# Patient Record
Sex: Female | Born: 1945
Health system: Southern US, Community
[De-identification: ages and names within clinical notes are randomized; demographics above are authoritative.]

## PROBLEM LIST (undated history)

## (undated) DIAGNOSIS — I671 Cerebral aneurysm, nonruptured: Secondary | ICD-10-CM

## (undated) DIAGNOSIS — E049 Nontoxic goiter, unspecified: Secondary | ICD-10-CM

## (undated) DIAGNOSIS — Z923 Personal history of irradiation: Secondary | ICD-10-CM

## (undated) DIAGNOSIS — I10 Essential (primary) hypertension: Secondary | ICD-10-CM

## (undated) DIAGNOSIS — E785 Hyperlipidemia, unspecified: Secondary | ICD-10-CM

## (undated) DIAGNOSIS — K5730 Diverticulosis of large intestine without perforation or abscess without bleeding: Secondary | ICD-10-CM

## (undated) DIAGNOSIS — M8580 Other specified disorders of bone density and structure, unspecified site: Secondary | ICD-10-CM

## (undated) DIAGNOSIS — K219 Gastro-esophageal reflux disease without esophagitis: Secondary | ICD-10-CM

## (undated) DIAGNOSIS — E559 Vitamin D deficiency, unspecified: Secondary | ICD-10-CM

## (undated) DIAGNOSIS — C801 Malignant (primary) neoplasm, unspecified: Secondary | ICD-10-CM

## (undated) HISTORY — DX: Essential (primary) hypertension: I10

## (undated) HISTORY — PX: OTHER SURGICAL HISTORY: SHX169

## (undated) HISTORY — DX: Other specified disorders of bone density and structure, unspecified site: M85.80

## (undated) HISTORY — DX: Diverticulosis of large intestine without perforation or abscess without bleeding: K57.30

## (undated) HISTORY — DX: Hyperlipidemia, unspecified: E78.5

## (undated) HISTORY — PX: OOPHORECTOMY: SHX86

## (undated) HISTORY — PX: BREAST LUMPECTOMY: SHX2

## (undated) HISTORY — DX: Gastro-esophageal reflux disease without esophagitis: K21.9

## (undated) HISTORY — DX: Malignant (primary) neoplasm, unspecified: C80.1

## (undated) HISTORY — PX: NOSE SURGERY: SHX723

## (undated) HISTORY — DX: Nontoxic goiter, unspecified: E04.9

## (undated) HISTORY — DX: Vitamin D deficiency, unspecified: E55.9

---

## 1974-02-27 HISTORY — PX: VAGINAL HYSTERECTOMY: SUR661

## 1999-04-18 ENCOUNTER — Other Ambulatory Visit: Admission: RE | Admit: 1999-04-18 | Discharge: 1999-04-18 | Payer: Self-pay | Admitting: Obstetrics and Gynecology

## 2000-06-07 ENCOUNTER — Other Ambulatory Visit: Admission: RE | Admit: 2000-06-07 | Discharge: 2000-06-07 | Payer: Self-pay | Admitting: Obstetrics and Gynecology

## 2001-08-06 ENCOUNTER — Other Ambulatory Visit: Admission: RE | Admit: 2001-08-06 | Discharge: 2001-08-06 | Payer: Self-pay | Admitting: Obstetrics and Gynecology

## 2002-10-22 ENCOUNTER — Other Ambulatory Visit: Admission: RE | Admit: 2002-10-22 | Discharge: 2002-10-22 | Payer: Self-pay | Admitting: Obstetrics and Gynecology

## 2004-02-15 ENCOUNTER — Other Ambulatory Visit: Admission: RE | Admit: 2004-02-15 | Discharge: 2004-02-15 | Payer: Self-pay | Admitting: Obstetrics and Gynecology

## 2005-03-01 ENCOUNTER — Ambulatory Visit: Payer: Self-pay | Admitting: Internal Medicine

## 2005-03-07 ENCOUNTER — Ambulatory Visit: Payer: Self-pay | Admitting: Internal Medicine

## 2005-04-11 ENCOUNTER — Ambulatory Visit: Payer: Self-pay | Admitting: Internal Medicine

## 2006-04-25 ENCOUNTER — Other Ambulatory Visit: Admission: RE | Admit: 2006-04-25 | Discharge: 2006-04-25 | Payer: Self-pay | Admitting: Obstetrics and Gynecology

## 2007-06-05 ENCOUNTER — Telehealth (INDEPENDENT_AMBULATORY_CARE_PROVIDER_SITE_OTHER): Payer: Self-pay | Admitting: *Deleted

## 2007-06-05 ENCOUNTER — Ambulatory Visit: Payer: Self-pay | Admitting: Internal Medicine

## 2007-06-05 DIAGNOSIS — K5730 Diverticulosis of large intestine without perforation or abscess without bleeding: Secondary | ICD-10-CM | POA: Insufficient documentation

## 2007-06-05 DIAGNOSIS — K219 Gastro-esophageal reflux disease without esophagitis: Secondary | ICD-10-CM

## 2007-06-05 DIAGNOSIS — E785 Hyperlipidemia, unspecified: Secondary | ICD-10-CM

## 2007-06-05 HISTORY — DX: Gastro-esophageal reflux disease without esophagitis: K21.9

## 2007-06-05 HISTORY — DX: Diverticulosis of large intestine without perforation or abscess without bleeding: K57.30

## 2007-06-05 HISTORY — DX: Hyperlipidemia, unspecified: E78.5

## 2007-06-19 ENCOUNTER — Encounter: Payer: Self-pay | Admitting: Internal Medicine

## 2007-06-20 ENCOUNTER — Ambulatory Visit: Payer: Self-pay | Admitting: Internal Medicine

## 2007-06-20 DIAGNOSIS — R221 Localized swelling, mass and lump, neck: Secondary | ICD-10-CM

## 2007-06-20 DIAGNOSIS — R220 Localized swelling, mass and lump, head: Secondary | ICD-10-CM | POA: Insufficient documentation

## 2007-06-27 ENCOUNTER — Encounter: Payer: Self-pay | Admitting: Internal Medicine

## 2008-02-11 ENCOUNTER — Ambulatory Visit: Payer: Self-pay | Admitting: Internal Medicine

## 2008-02-11 LAB — CONVERTED CEMR LAB
ALT: 18 units/L (ref 0–35)
AST: 21 units/L (ref 0–37)
Alkaline Phosphatase: 72 units/L (ref 39–117)
Bilirubin Urine: NEGATIVE
Bilirubin, Direct: 0.1 mg/dL (ref 0.0–0.3)
CO2: 28 meq/L (ref 19–32)
Calcium: 9 mg/dL (ref 8.4–10.5)
Chloride: 104 meq/L (ref 96–112)
Glucose, Bld: 95 mg/dL (ref 70–99)
Hemoglobin: 13.8 g/dL (ref 12.0–15.0)
Leukocytes, UA: NEGATIVE
Lymphocytes Relative: 33.8 % (ref 12.0–46.0)
Monocytes Relative: 6.4 % (ref 3.0–12.0)
Mucus, UA: NEGATIVE
Neutro Abs: 4.7 10*3/uL (ref 1.4–7.7)
Neutrophils Relative %: 56 % (ref 43.0–77.0)
Nitrite: NEGATIVE
Potassium: 4 meq/L (ref 3.5–5.1)
RBC: 4.09 M/uL (ref 3.87–5.11)
RDW: 11.7 % (ref 11.5–14.6)
Sodium: 138 meq/L (ref 135–145)
Specific Gravity, Urine: <=1.005 (ref 1.000–1.03)
Total Bilirubin: 0.8 mg/dL (ref 0.3–1.2)
Total CHOL/HDL Ratio: 3.5
Total Protein: 6.5 g/dL (ref 6.0–8.3)
Urobilinogen, UA: 0.2 (ref 0.0–1.0)
WBC, UA: NONE SEEN cells/hpf
pH: 6 (ref 5.0–8.0)

## 2008-02-13 ENCOUNTER — Ambulatory Visit: Payer: Self-pay | Admitting: Internal Medicine

## 2008-02-13 DIAGNOSIS — R42 Dizziness and giddiness: Secondary | ICD-10-CM | POA: Insufficient documentation

## 2008-03-03 ENCOUNTER — Encounter: Payer: Self-pay | Admitting: Internal Medicine

## 2008-03-03 ENCOUNTER — Encounter: Admission: RE | Admit: 2008-03-03 | Discharge: 2008-03-03 | Payer: Self-pay | Admitting: Internal Medicine

## 2008-03-03 DIAGNOSIS — E049 Nontoxic goiter, unspecified: Secondary | ICD-10-CM

## 2008-03-03 HISTORY — DX: Nontoxic goiter, unspecified: E04.9

## 2008-03-09 ENCOUNTER — Encounter: Admission: RE | Admit: 2008-03-09 | Discharge: 2008-03-09 | Payer: Self-pay | Admitting: Internal Medicine

## 2008-06-25 ENCOUNTER — Other Ambulatory Visit: Admission: RE | Admit: 2008-06-25 | Discharge: 2008-06-25 | Payer: Self-pay | Admitting: Obstetrics and Gynecology

## 2008-06-25 ENCOUNTER — Ambulatory Visit: Payer: Self-pay | Admitting: Obstetrics and Gynecology

## 2008-06-25 ENCOUNTER — Encounter: Payer: Self-pay | Admitting: Obstetrics and Gynecology

## 2008-11-17 ENCOUNTER — Ambulatory Visit: Payer: Self-pay | Admitting: Internal Medicine

## 2008-11-17 DIAGNOSIS — J029 Acute pharyngitis, unspecified: Secondary | ICD-10-CM | POA: Insufficient documentation

## 2008-11-17 DIAGNOSIS — M549 Dorsalgia, unspecified: Secondary | ICD-10-CM | POA: Insufficient documentation

## 2009-02-27 HISTORY — PX: COLONOSCOPY: SHX174

## 2009-05-18 ENCOUNTER — Encounter (INDEPENDENT_AMBULATORY_CARE_PROVIDER_SITE_OTHER): Payer: Self-pay | Admitting: *Deleted

## 2009-06-10 ENCOUNTER — Ambulatory Visit: Payer: Self-pay | Admitting: Gastroenterology

## 2009-06-10 ENCOUNTER — Encounter (INDEPENDENT_AMBULATORY_CARE_PROVIDER_SITE_OTHER): Payer: Self-pay | Admitting: *Deleted

## 2009-06-24 ENCOUNTER — Ambulatory Visit: Payer: Self-pay | Admitting: Gastroenterology

## 2009-06-28 ENCOUNTER — Encounter: Payer: Self-pay | Admitting: Gastroenterology

## 2009-07-06 ENCOUNTER — Other Ambulatory Visit: Admission: RE | Admit: 2009-07-06 | Discharge: 2009-07-06 | Payer: Self-pay | Admitting: Obstetrics and Gynecology

## 2009-07-06 ENCOUNTER — Ambulatory Visit: Payer: Self-pay | Admitting: Obstetrics and Gynecology

## 2010-01-11 ENCOUNTER — Ambulatory Visit: Payer: Self-pay | Admitting: Internal Medicine

## 2010-01-11 LAB — CONVERTED CEMR LAB
ALT: 13 units/L (ref 0–35)
BUN: 9 mg/dL (ref 6–23)
Basophils Absolute: 0 10*3/uL (ref 0.0–0.1)
Basophils Relative: 0.4 % (ref 0.0–3.0)
Bilirubin Urine: NEGATIVE
Bilirubin, Direct: 0 mg/dL (ref 0.0–0.3)
CO2: 26 meq/L (ref 19–32)
Calcium: 8.8 mg/dL (ref 8.4–10.5)
Chloride: 100 meq/L (ref 96–112)
Cholesterol: 267 mg/dL — ABNORMAL HIGH (ref 0–200)
Creatinine, Ser: 0.6 mg/dL (ref 0.4–1.2)
Direct LDL: 148.8 mg/dL
Eosinophils Absolute: 0.3 10*3/uL (ref 0.0–0.7)
HCT: 39.5 % (ref 36.0–46.0)
Hemoglobin: 13.7 g/dL (ref 12.0–15.0)
Ketones, ur: NEGATIVE mg/dL
Lymphocytes Relative: 33.5 % (ref 12.0–46.0)
Lymphs Abs: 2.8 10*3/uL (ref 0.7–4.0)
MCHC: 34.6 g/dL (ref 30.0–36.0)
MCV: 96.1 fL (ref 78.0–100.0)
Neutro Abs: 4.7 10*3/uL (ref 1.4–7.7)
RBC: 4.11 M/uL (ref 3.87–5.11)
RDW: 13 % (ref 11.5–14.6)
Specific Gravity, Urine: <=1.005 (ref 1.000–1.030)
TSH: 3.48 microintl units/mL (ref 0.35–5.50)
Total Bilirubin: 0.5 mg/dL (ref 0.3–1.2)
Total Protein: 6.4 g/dL (ref 6.0–8.3)
Triglycerides: 170 mg/dL — ABNORMAL HIGH (ref 0.0–149.0)
Urine Glucose: NEGATIVE mg/dL
pH: 6.5 (ref 5.0–8.0)

## 2010-01-26 ENCOUNTER — Ambulatory Visit: Payer: Self-pay | Admitting: Internal Medicine

## 2010-01-26 ENCOUNTER — Encounter: Payer: Self-pay | Admitting: Internal Medicine

## 2010-01-26 DIAGNOSIS — K111 Hypertrophy of salivary gland: Secondary | ICD-10-CM | POA: Insufficient documentation

## 2010-03-31 NOTE — Procedures (Signed)
Summary: Colonoscopy  Patient: Isabel Christensen Note: All result statuses are Final unless otherwise noted.  Tests: (1) Colonoscopy (COL)   COL Colonoscopy           DONE     [facility]     [address]     [place], [place]  [address]           COLONOSCOPY PROCEDURE REPORT           PATIENT:  Isabel Christensen, Isabel Christensen  MR#:  [id number]     BIRTHDATE:  February 12, 1946, 64 yrs. old  GENDER:  female           ENDOSCOPIST:  Barbette Hair. Arlyce Dice, MD     Referred by:           PROCEDURE DATE:  06/24/2009     PROCEDURE:  Colonoscopy with snare polypectomy     ASA CLASS:  Class II     INDICATIONS:  1) Routine Risk Screening           MEDICATIONS:   Fentanyl 50 mcg IV, Versed 7 mg IV           DESCRIPTION OF PROCEDURE:   After the risks benefits and     alternatives of the procedure were thoroughly explained, informed     consent was obtained.  Digital rectal exam was performed and     revealed no abnormalities.   The Pentax Colonoscope [id number]     endoscope was introduced through the anus and advanced to the     cecum, which was identified by both the appendix and ileocecal     valve, without limitations.  The quality of the prep was     excellent, using MoviPrep.  The instrument was then slowly     withdrawn as the colon was fully examined.     <<PROCEDUREIMAGES>>           FINDINGS:  A sessile polyp was found in the descending colon. It     was 4 mm in size. Polyp was snared without cautery. Retrieval was     successful (see image6). snare polyp  Scattered diverticula were     found in the sigmoid to descending colon segments (see image8 and     image9).  This was otherwise a normal examination of the colon     (see image1, image2, image3, image4, image5, and image10).     Retroflexed views in the rectum revealed no abnormalities.    The     time to cecum =  3.5  minutes. The scope was then withdrawn (time     =  8.0  min) from the patient and the procedure completed.        COMPLICATIONS:  None           ENDOSCOPIC IMPRESSION:     1) 4 mm sessile polyp in the descending colon     2) Diverticula, scattered in the sigmoid to descending colon     segments     3) Otherwise normal examination     RECOMMENDATIONS:     1) If the polyp(s) removed today are proven to be adenomatous     (pre-cancerous) polyps, you will need a repeat colonoscopy in 5     years. Otherwise you should continue to follow colorectal cancer     screening guidelines for "routine risk" patients with colonoscopy     in 10 years.           REPEAT  EXAM:  You will receive a letter from Dr. Arlyce Dice in 1-2     weeks, after reviewing the final pathology, with followup     recommendations.           ______________________________     Barbette Hair Arlyce Dice, MD           CC: Corwin Levins, MD           n.     Rosalie Doctor:   Barbette Hair. Kiam Bransfield at 06/24/2009 11:04 AM           Elby Beck, [id number]  Note: An exclamation mark (!) indicates a result that was not dispersed into the flowsheet. Document Creation Date: 06/24/2009 11:04 AM _______________________________________________________________________  (1) Order result status: Final Collection or observation date-time: 06/24/2009 10:59 Requested date-time:  Receipt date-time:  Reported date-time:  Referring Physician:   Ordering Physician: Melvia Heaps [phone]) Specimen Source:  Source: Launa Grill Order Number: [id number] Lab site:   Appended Document: Colonoscopy     Procedures Next Due Date:    Colonoscopy: 05/2019

## 2010-03-31 NOTE — Letter (Signed)
Summary: Colonoscopy Letter  [facility]  [address] [place], [place] [address]   Phone: [phone]  Fax: [phone]      May 18, 2009 MRN: [id number]   [facility] [address] [place], [place]  [address]   Dear Isabel Christensen,   According to your medical record, it is time for you to schedule a Colonoscopy. The American Cancer Society recommends this procedure as a method to detect early colon cancer. Patients with a family history of colon cancer, or a personal history of colon polyps or inflammatory bowel disease are at increased risk.  This letter has been generated based on the recommendations made at the time of your procedure. If you feel that in your particular situation this may no longer apply, please contact our office.  Please call our office at [phone] to schedule this appointment or to update your records at your earliest convenience.  Thank you for cooperating with [place] to provide you with the very best care possible.   Sincerely,  Barbette Hair. Arlyce Dice, M.D.  [facility] [phone]

## 2010-03-31 NOTE — Letter (Signed)
Summary: Patient Notice-Hyperplastic Polyps  [facility]  [address] [place], [place] [address]   Phone: [phone]  Fax: [phone]        Jun 28, 2009 MRN: [id number]    [facility] [address] [place], [place]  [address]    Dear Ms. Eriksson,  I am pleased to inform you that the colon polyp(s) removed during your recent colonoscopy was (were) found to be hyperplastic.  These types of polyps are NOT pre-cancerous.  It is therefore my recommendation that you have a repeat colonoscopy examination in 10_ years for routine colorectal cancer screening.  Should you develop new or worsening symptoms of abdominal pain, bowel habit changes or bleeding from the rectum or bowels, please schedule an evaluation with either your primary care physician or with me.  Additional information/recommendations:  __No further action with gastroenterology is needed at this time.      Please follow-up with your primary care physician for your other      healthcare needs. __Please call [phone] to schedule a return visit to review      your situation.  __Please keep your follow-up visit as already scheduled.  _x_Continue treatment plan as outlined the day of your exam.  Please call us if you are having persistent problems or have questions about your condition that have not been fully answered at this time.  Sincerely,  Louis Meckel MD This letter has been electronically signed by your physician.  Appended Document: Patient Notice-Hyperplastic Polyps letter mailed 5.3.11

## 2010-03-31 NOTE — Miscellaneous (Signed)
Summary: [facility] Previsit/prep  Clinical Lists Changes  Medications: Added new medication of MOVIPREP 100 GM  SOLR (PEG-KCL-NACL-NASULF-NA ASC-C) As per prep instructions. - Signed Rx of MOVIPREP 100 GM  SOLR (PEG-KCL-NACL-NASULF-NA ASC-C) As per prep instructions.;  #1 x 0;  Signed;  Entered by: Wyona Almas RN;  Authorized by: Louis Meckel MD;  Method used: [facility] [phone]*, [address], [place], [place], [place]  [address], Ph: [phone] or [phone], Fax: [phone] Observations: Added new observation of ALLERGY REV: Done (06/10/2009 10:26)    Prescriptions: MOVIPREP 100 GM  SOLR (PEG-KCL-NACL-NASULF-NA ASC-C) As per prep instructions.  #1 x 0   Entered by:   Wyona Almas RN   Authorized by:   Louis Meckel MD   Signed by:   Wyona Almas RN on 06/10/2009   Method used:   [facility]ronically to        [facility] [phone]* (retail)       [address]       [place], [place]  [address]       Ph: [phone] or [phone]       Fax: [phone]   RxID:   [id number]

## 2010-03-31 NOTE — Letter (Signed)
Summary: [facility] Instructions  [facility]  [address] [place], [place] [address]   Phone: [phone]  Fax: [phone]       Isabel Christensen    1945-03-26    MRN: [id number]        Procedure Day Dorna Bloom:  Lenor Coffin  06/24/09     Arrival Time:  9:30AM     Procedure Time:  10:30AM     Location of Procedure:                    _X _  [facility] (4th Floor)                        PREPARATION FOR COLONOSCOPY WITH MOVIPREP   Starting 5 days prior to your procedure 06/19/09 do not eat nuts, seeds, popcorn, corn, beans, peas,  salads, or any raw vegetables.  Do not take any fiber supplements (e.g. Metamucil, Citrucel, and Benefiber).  THE DAY BEFORE YOUR PROCEDURE         DATE: 06/23/09  DAY: WEDNESDAY  1.  Drink clear liquids the entire day-NO SOLID FOOD  2.  Do not drink anything colored red or purple.  Avoid juices with pulp.  No orange juice.  3.  Drink at least 64 oz. (8 glasses) of fluid/clear liquids during the day to prevent dehydration and help the prep work efficiently.  CLEAR LIQUIDS INCLUDE: Water Jello Ice Popsicles Tea (sugar ok, no milk/cream) Powdered fruit flavored drinks Coffee (sugar ok, no milk/cream) Gatorade Juice: apple, white grape, white cranberry  Lemonade Clear bullion, consomm, broth Carbonated beverages (any kind) Strained chicken noodle soup Hard Candy                             4.  In the morning, mix first dose of MoviPrep solution:    Empty 1 Pouch A and 1 Pouch B into the disposable container    Add lukewarm drinking water to the top line of the container. Mix to dissolve    Refrigerate (mixed solution should be used within 24 hrs)  5.  Begin drinking the prep at 5:00 p.m. The MoviPrep container is divided by 4 marks.   Every 15 minutes drink the solution down to the next mark (approximately 8 oz) until the full liter is complete.   6.  Follow completed prep with 16 oz of clear liquid of your choice  (Nothing red or purple).  Continue to drink clear liquids until bedtime.  7.  Before going to bed, mix second dose of MoviPrep solution:    Empty 1 Pouch A and 1 Pouch B into the disposable container    Add lukewarm drinking water to the top line of the container. Mix to dissolve    Refrigerate  THE DAY OF YOUR PROCEDURE      DATE: 06/24/09  DAY: THURSDAY  Beginning at 5:30AM (5 hours before procedure):         1. Every 15 minutes, drink the solution down to the next mark (approx 8 oz) until the full liter is complete.  2. Follow completed prep with 16 oz. of clear liquid of your choice.    3. You may drink clear liquids until 8:30AM (2 HOURS BEFORE PROCEDURE).   MEDICATION INSTRUCTIONS  Unless otherwise instructed, you should take regular prescription medications with a small sip of water   as early as possible the morning  of your procedure.          OTHER INSTRUCTIONS  You will need a responsible adult at least 65 years of age to accompany you and drive you home.   This person must remain in the waiting room during your procedure.  Wear loose fitting clothing that is easily removed.  Leave jewelry and other valuables at home.  However, you may wish to bring a book to read or  an iPod/MP3 player to listen to music as you wait for your procedure to start.  Remove all body piercing jewelry and leave at home.  Total time from sign-in until discharge is approximately 2-3 hours.  You should go home directly after your procedure and rest.  You can resume normal activities the  day after your procedure.  The day of your procedure you should not:   Drive   Make legal decisions   Operate machinery   Drink alcohol   Return to work  You will receive specific instructions about eating, activities and medications before you leave.    The above instructions have been reviewed and explained to me by   Wyona Almas RN  June 10, 2009 10:59 AM     I fully  understand and can verbalize these instructions _____________________________ Date _________

## 2010-03-31 NOTE — Assessment & Plan Note (Signed)
Summary: CPX /NWS  #   Vital Signs:  Patient profile:   65 year old female Height:      63.5 inches Weight:      124.25 pounds BMI:     21.74 O2 Sat:      96 % on Room air Temp:     98 degrees F oral Pulse rate:   67 / minute BP sitting:   112 / 70  (left arm) Cuff size:   regular  Vitals Entered By: Zella Ball Ewing CMA Duncan Dull) (January 26, 2010 10:38 AM)  O2 Flow:  Room air  Preventive Care Screening     pap smear, mamogram and bone density per GYN  CC: ADult Physical/RE   CC:  ADult Physical/RE.  History of Present Illness: here for wellness, overall doing well and regards herself as good to excellent health;  Pt denies CP, worsening sob, doe, wheezing, orthopnea, pnd, worsening LE edema, palps, dizziness or syncope  Pt denies new neuro symptoms such as headache, facial or extremity weakness  Pt denies polydipsia, polyuria.  Overall good compliance with meds, trying to follow low chol diet, wt stable, little excercise however .  Denies worsening depressive symptoms, suicidal ideation, or panic.   No fever, wt loss, night sweats, loss of appetite or other constitutional symptoms  Overall good compliance with meds, and good tolerability.  Pt states good ability with ADL's, low fall risk, home safety reviewed and adequate, no significant change in hearing or vision, trying to follow lower chol diet, and remaiins very  active  with regular excercise.   Preventive Screening-Counseling & Management      Drug Use:  no.    Problems Prior to Update: 1)  Hypertrophy of Salivary Gland  (ICD-527.1) 2)  Back Pain  (ICD-724.5) 3)  Acute Pharyngitis  (ICD-462) 4)  Goiter  (ICD-240.9) 5)  Dizziness  (ICD-780.4) 6)  Preventive Health Care  (ICD-V70.0) 7)  Neck Mass  (ICD-784.2) 8)  Diverticulosis, Colon  (ICD-562.10) 9)  Hyperlipidemia  (ICD-272.4) 10)  Gerd  (ICD-530.81)  Medications Prior to Update: 1)  Premarin 0.3 Mg  Tabs (Estrogens Conjugated) .... 1/2 Tab Qd 2)  Epipen Jr 2-Pak  0.15 Mg/0.59ml (1:2000) Devi (Epinephrine Hcl (Anaphylaxis)) .... Use Asd 3)  Azithromycin 250 Mg Tabs (Azithromycin) .... 2po Qd For 1 Day, Then 1po Qd For 4days, Then Stop  Current Medications (verified): 1)  Premarin 0.3 Mg  Tabs (Estrogens Conjugated) .... 1/2 Tab Qd 2)  Epipen Jr 2-Pak 0.15 Mg/0.26ml (1:2000) Devi (Epinephrine Hcl (Anaphylaxis)) .... Use Asd 3)  Lansoprazole 15 Mg Cpdr (Lansoprazole) .Marland Kitchen.. 1 By Mouth Once Daily  Allergies (verified): 1)  ! Phenergan 2)  ! 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         all otherwise negative per pt -  except for occasional GERD symtpoms, and a knwon right lower lip subq "mass" the she manually "popped" yesterday and now smaller but still sore  Physical Exam  General:  alert and well-developed.   Head:  normocephalic and atraumatic.   Eyes:  vision grossly intact, pupils  equal, and pupils round.   Ears:  R ear normal and L ear normal.   Nose:  no external deformity and no nasal discharge.   Mouth:  no gingival abnormalities and pharynx pink and moist.   Neck:  supple and no masses.   Lungs:  normal respiratory effort and normal breath sounds.   Heart:  normal rate and regular rhythm.   Abdomen:  soft, non-tender, and normal bowel sounds.   Msk:  no joint tenderness and no joint swelling., no lumbar tenderness  or paravertebral tedner Extremities:  no edema, no erythema  Neurologic:  strength normal in all extremities and gait normal.   Skin:  color normal, no rashes, and no suspicious lesions.   Psych:  not anxious appearing and not depressed appearing.     Impression & Recommendations:  Problem # 1:  Preventive Health Care (ICD-V70.0) Overall doing well, age appropriate education and counseling updated, referral for preventive services and immunizations addressed, dietary counseling and smoking status adressed , most recent labs reviewed, ecg reviewed I have personally reviewed and have noted 1.The patient's medical and social history 2.Their use of alcohol, tobacco or illicit drugs 3.Their current medications and supplements 4. Functional ability including ADL's, fall risk, home safety risk, hearing & visual impairment  5.Diet and physical activities 6.Evidence for depression or mood disorders The patients weight, height, BMI  have been recorded in the chart I have made referrals, counseling and provided education to the patient based review of the above  Orders: EKG w/ Interpretation ([address])  Problem # 2:  HYPERTROPHY OF SALIVARY GLAND (ICD-527.1) irght lower lip, improved with manipulation as above, no drainage, redness or swelling around the subq lesion, hold on antibx for now, consider oral surgury  Problem # 3:  HYPERLIPIDEMIA (ICD-272.4)  to cont diet - declines statin fo now  Labs Reviewed: SGOT: 21 (01/11/2010)   SGPT: 13  (01/11/2010)   HDL:89.00 (01/11/2010), 78.8 (02/11/2008)  LDL:DEL (02/11/2008)  Chol:267 (01/11/2010), 276 (02/11/2008)  Trig:170.0 (01/11/2010), 100 (02/11/2008)  Problem # 4:  GERD (ICD-530.81)  Her updated medication list for this problem includes:    Lansoprazole 15 Mg Cpdr (Lansoprazole) .Marland Kitchen... 1 by mouth once daily treat as above, f/u any worsening signs or symptoms , no dysphgia   Complete Medication List: 1)  Premarin 0.3 Mg Tabs (Estrogens conjugated) .... 1/2 tab qd 2)  Epipen Jr 2-pak 0.15 Mg/0.86ml (1:2000) Devi (Epinephrine hcl (anaphylaxis)) .... Use asd 3)  Lansoprazole 15 Mg Cpdr (Lansoprazole) .Marland Kitchen.. 1 by mouth once daily  Patient Instructions: 1)  please follow lower cholesterol diet 2)  Please take all new medications as prescribed  - the generic prevacid 3)  Continue all previous medications as before this visit  4)  call if you feel the need for referral to oral surgury 5)  Please schedule a follow-up appointment in 1 year, or sooner if needed Prescriptions: LANSOPRAZOLE 15 MG CPDR (LANSOPRAZOLE) 1 by mouth once daily  #90 x 3   Entered and Authorized by:   Corwin Levins MD   Signed by:   Corwin Levins MD  on 01/26/2010   Method used:   Electronically to        [facility] (retail)       [address]       [place], [place]  [address]       Ph: [phone]       Fax: [phone]   RxID:   [id number]    Orders Added: 1)  EKG w/ Interpretation [93000] 2)  Est. Patient 40-64 years [phone]

## 2010-07-06 ENCOUNTER — Telehealth: Payer: Self-pay | Admitting: *Deleted

## 2010-07-06 MED ORDER — LANSOPRAZOLE 15 MG PO CPDR: 15.0000 mg | DELAYED_RELEASE_CAPSULE | Freq: Every day | ORAL | Status: DC

## 2010-07-06 NOTE — Telephone Encounter (Signed)
Patient requesting gen prevacid 90 days supply to go to [facility].

## 2010-10-20 ENCOUNTER — Other Ambulatory Visit (HOSPITAL_COMMUNITY)
Admission: RE | Admit: 2010-10-20 | Discharge: 2010-10-20 | Disposition: A | Discharge status: Home / Self Care | Payer: Medicare Other | Source: Ambulatory Visit | Attending: Obstetrics and Gynecology | Admitting: Obstetrics and Gynecology

## 2010-10-20 ENCOUNTER — Ambulatory Visit (INDEPENDENT_AMBULATORY_CARE_PROVIDER_SITE_OTHER): Payer: Medicare Other | Admitting: Obstetrics and Gynecology

## 2010-10-20 ENCOUNTER — Encounter: Payer: Self-pay | Admitting: Obstetrics and Gynecology

## 2010-10-20 VITALS — BP 118/76 | Ht 63.0 in | Wt 122.0 lb

## 2010-10-20 DIAGNOSIS — R32 Unspecified urinary incontinence: Secondary | ICD-10-CM

## 2010-10-20 DIAGNOSIS — N952 Postmenopausal atrophic vaginitis: Secondary | ICD-10-CM

## 2010-10-20 DIAGNOSIS — Z780 Asymptomatic menopausal state: Secondary | ICD-10-CM

## 2010-10-20 DIAGNOSIS — N951 Menopausal and female climacteric states: Secondary | ICD-10-CM

## 2010-10-20 DIAGNOSIS — Z124 Encounter for screening for malignant neoplasm of cervix: Secondary | ICD-10-CM

## 2010-10-20 DIAGNOSIS — Z01419 Encounter for gynecological examination (general) (routine) without abnormal findings: Secondary | ICD-10-CM | POA: Insufficient documentation

## 2010-10-20 MED ORDER — ESTRADIOL 0.05 MG/24HR TD PTTW: 1.0000 | MEDICATED_PATCH | TRANSDERMAL | Status: DC

## 2010-10-20 NOTE — Progress Notes (Signed)
The patient came to see me today for further followup. Measure we increased her HRT from Premarin 0.3 mg to estradiol half a milligram. She is doing better although she still gets hot flashes. She is also having vaginal dryness. She is having discomfort with intercourse. She does use asroglyde. She feels that she doesn't empty her bladder well. She is urinary stress incontinence. She does get up at night to void.  She does not have urgency. She does not have dysuria. She is having no pelvic pain. She is having no vaginal bleeding. She is due for her yearly mammogram which she does in [place]. Her last bone density was normal but was 8 years ago. She is a thin white female. She's had no fractures.  Past medical history, family history, social history, and review of systems all reviewed and in epic record. 9 system review of systems done with all positive and negative pertinent findings discussed above.  HEENT: Within normal limits. Neck: No masses. Supraclavicular lymph nodes: Not enlarged. Breasts: Examined in both sitting and lying position. Symmetrical without skin changes or masses. Abdomen: Soft no masses guarding or rebound. No hernias. Pelvic: External within normal limits. BUS within normal limits. Vaginal examination shows good estrogen effect, no cystocele enterocele or rectocele. Cervix and uterus absent. Adnexa within normal limits. Rectovaginal confirmatory. Extremities within normal limits.   Assessment: 1. Heart flashes 2. Atrophic vaginitis 3. Urinary stress incontinence  Plan: We are very long discussion of pros and cons of HRT at this point. We will switch her to a patch to try to reduce cardiovascular issues. She was started on Vivelle dot patch 0.05 mg twice a week. She will get a mammogram in bone density in [facility].

## 2011-01-13 ENCOUNTER — Other Ambulatory Visit: Payer: Self-pay | Admitting: Internal Medicine

## 2011-01-25 ENCOUNTER — Other Ambulatory Visit: Payer: Self-pay

## 2011-01-25 MED ORDER — LANSOPRAZOLE 15 MG PO CPDR: 15.0000 mg | DELAYED_RELEASE_CAPSULE | Freq: Every day | ORAL | Status: DC

## 2011-05-10 ENCOUNTER — Encounter: Payer: Self-pay | Admitting: Obstetrics and Gynecology

## 2011-09-22 ENCOUNTER — Other Ambulatory Visit: Payer: Self-pay | Admitting: Obstetrics and Gynecology

## 2011-11-14 ENCOUNTER — Encounter: Payer: BC Managed Care – PPO | Admitting: Obstetrics and Gynecology

## 2011-11-21 ENCOUNTER — Other Ambulatory Visit: Payer: Self-pay | Admitting: Obstetrics and Gynecology

## 2011-11-29 ENCOUNTER — Ambulatory Visit (INDEPENDENT_AMBULATORY_CARE_PROVIDER_SITE_OTHER): Payer: Medicare Other | Admitting: Obstetrics and Gynecology

## 2011-11-29 ENCOUNTER — Encounter: Payer: Self-pay | Admitting: Obstetrics and Gynecology

## 2011-11-29 VITALS — BP 120/78 | Ht 64.0 in | Wt 120.0 lb

## 2011-11-29 DIAGNOSIS — N952 Postmenopausal atrophic vaginitis: Secondary | ICD-10-CM

## 2011-11-29 DIAGNOSIS — Z803 Family history of malignant neoplasm of breast: Secondary | ICD-10-CM

## 2011-11-29 DIAGNOSIS — Z780 Asymptomatic menopausal state: Secondary | ICD-10-CM

## 2011-11-29 MED ORDER — ESTRADIOL 0.05 MG/24HR TD PTTW: 1.0000 | MEDICATED_PATCH | TRANSDERMAL | Status: DC

## 2011-11-29 NOTE — Progress Notes (Signed)
Patient came to see me today for her annual GYN exam. She continues on an estrogen patch due to severe vasomotor symptoms and vaginal dryness. She had a vaginal hysterectomy in 1976 for sterilization. She has always had normal Pap smears. Her last Pap smear was 2012. She had a mammogram in 2013. She had a normal bone density 16 years ago. She was concerned regarding the patch due to mother who developed breast cancer at age 67 has sister who just recently developed breast cancer at 40. She is having no vaginal bleeding. She is having no pelvic pain. She has no family history of ovarian cancer. She does her lab work from her PCP.  ROS: 12 system review done. Pertinent positives above. Other positives include thyroid goiter, hyperlipidemia, gerd, diverticulosis, dizziness.  Physical examination: HEENT within normal limits. Neck: Thyroid not large. No masses. Supraclavicular nodes: not enlarged. Breasts: Examined in both sitting and lying  position. No skin changes and no masses. Abdomen: Soft no guarding rebound or masses or hernia. Pelvic: External: Within normal limits. BUS: Within normal limits. Vaginal:within normal limits. Good estrogen effect. No evidence of cystocele rectocele or enterocele. Cervix and uterus surgically absent. Adnexa: No masses. Rectovaginal exam: Confirmatory and negative. Extremities: Within normal limits.  Assessment: #1. Menopausal symptoms #2. Atrophic vaginitis #3. Family history of late onset breast cancer  Plan: Discussed all the data on estrogen and breast cancer. Reassured her about unopposed estrogen and risk of breast cancer. Continue yearly mammograms. Suggest that she have 3-D mammograms. Scheduled bone density. Pap not done.The new Pap smear guidelines were discussed with the patient.

## 2011-11-29 NOTE — Patient Instructions (Addendum)
Schedule  bone density. Continue yearly mammograms. 

## 2011-12-14 ENCOUNTER — Ambulatory Visit (INDEPENDENT_AMBULATORY_CARE_PROVIDER_SITE_OTHER): Payer: Medicare Other

## 2011-12-14 DIAGNOSIS — M8580 Other specified disorders of bone density and structure, unspecified site: Secondary | ICD-10-CM

## 2011-12-14 DIAGNOSIS — Z780 Asymptomatic menopausal state: Secondary | ICD-10-CM

## 2011-12-14 DIAGNOSIS — M949 Disorder of cartilage, unspecified: Secondary | ICD-10-CM

## 2011-12-14 DIAGNOSIS — M899 Disorder of bone, unspecified: Secondary | ICD-10-CM

## 2011-12-20 ENCOUNTER — Encounter: Payer: Self-pay | Admitting: Obstetrics and Gynecology

## 2012-02-26 ENCOUNTER — Telehealth: Payer: Self-pay | Admitting: Internal Medicine

## 2012-02-26 NOTE — Telephone Encounter (Signed)
Pt was last seen in Nov. 2011.  She has medicare.  Is it ok to schedule her for a physical with you?

## 2012-02-28 NOTE — Telephone Encounter (Signed)
Ok to schedule one visit with me for f/u chronic problems, and also a separate appt with Rene Kocher to do her free yearly Medicare Wellness visit (can be done on the same day)

## 2012-03-01 NOTE — Telephone Encounter (Signed)
Pt scheduled her medicare wellness for Jan 10 with Nicki Reaper.

## 2012-03-03 ENCOUNTER — Emergency Department (HOSPITAL_COMMUNITY)
Admission: EM | Admit: 2012-03-03 | Discharge: 2012-03-03 | Disposition: A | Discharge status: Home / Self Care | Payer: Medicare Other | Attending: Emergency Medicine | Admitting: Emergency Medicine

## 2012-03-03 ENCOUNTER — Emergency Department (HOSPITAL_COMMUNITY): Payer: Medicare Other

## 2012-03-03 ENCOUNTER — Encounter (HOSPITAL_COMMUNITY): Payer: Self-pay | Admitting: *Deleted

## 2012-03-03 DIAGNOSIS — R0602 Shortness of breath: Secondary | ICD-10-CM | POA: Insufficient documentation

## 2012-03-03 DIAGNOSIS — R319 Hematuria, unspecified: Secondary | ICD-10-CM | POA: Insufficient documentation

## 2012-03-03 DIAGNOSIS — Z79899 Other long term (current) drug therapy: Secondary | ICD-10-CM | POA: Insufficient documentation

## 2012-03-03 DIAGNOSIS — R079 Chest pain, unspecified: Secondary | ICD-10-CM | POA: Insufficient documentation

## 2012-03-03 DIAGNOSIS — Z8679 Personal history of other diseases of the circulatory system: Secondary | ICD-10-CM | POA: Insufficient documentation

## 2012-03-03 DIAGNOSIS — F17200 Nicotine dependence, unspecified, uncomplicated: Secondary | ICD-10-CM | POA: Insufficient documentation

## 2012-03-03 HISTORY — DX: Cerebral aneurysm, nonruptured: I67.1

## 2012-03-03 LAB — BASIC METABOLIC PANEL
BUN: 11 mg/dL (ref 6–23)
CO2: 25 mEq/L (ref 19–32)
Chloride: 100 mEq/L (ref 96–112)
GFR calc non Af Amer: >90 mL/min (ref >90)
Glucose, Bld: 97 mg/dL (ref 70–99)
Potassium: 3.5 mEq/L (ref 3.5–5.1)
Sodium: 139 mEq/L (ref 135–145)

## 2012-03-03 LAB — CBC
HCT: 42.3 % (ref 36.0–46.0)
Hemoglobin: 14.9 g/dL (ref 12.0–15.0)
RBC: 4.48 MIL/uL (ref 3.87–5.11)
WBC: 9.5 10*3/uL (ref 4.0–10.5)

## 2012-03-03 NOTE — ED Notes (Signed)
Called lab to check on troponin 

## 2012-03-03 NOTE — ED Provider Notes (Signed)
History     CSN: [id number]  Arrival date & time 03/03/12  1325   None     Chief Complaint  Patient presents with  . Chest Pain    (Consider location/radiation/quality/duration/timing/severity/associated sxs/prior treatment) HPI Comments: Patient is a 67 year old woman who says she developed chest pain after Christmas. She feels it in her left anterior chest, and it doesn't radiate. She says a few years ago she had pneumonia in the pain was something like that. She's had some sinus problems recently. The chest pain would come and go, without any obvious precipitating cause. She took no medication for it. Today she felt a little more after church, and went to an urgent care Center, where her blood pressure was noted to be elevated at 188/100. Therefore she was sent to [facility] for evaluation. There is no prior history of any heart disease. She does smoke one pack of cigarettes per day.  Patient is a 67 y.o. female presenting with chest pain. The history is provided by the patient. No language interpreter was used.  Chest Pain The chest pain began 1 - 2 weeks ago. Episode Length: She has brief episodes of chest pain in the left precordial region. The pain is like a pressure feeling. Chest pain occurs intermittently. The chest pain is unchanged. Associated with: Nothing. The severity of the pain is moderate. The quality of the pain is described as pressure-like. The pain does not radiate. Exacerbated by: Nothing. Primary symptoms include shortness of breath. Pertinent negatives for primary symptoms include no fever and no cough. She tried nothing for the symptoms. Risk factors include smoking/tobacco exposure and being elderly.     Past Medical History  Diagnosis Date  . Hematuria     FOLLOWED BY UROLOGIST FOR THIS  . Brain aneurysm     Past Surgical History  Procedure Date  . Nose surgery   . Vaginal hysterectomy 1976    FOR STERILIZATION    Family History  Problem Relation  Age of Onset  . Diabetes Mother   . Hypertension Mother   . Heart disease Mother   . Breast cancer Mother     Age 63  . Cancer Sister     MELANOMA  . Breast cancer Sister     Age 9  . Cancer Sister     Melanoma    History  Substance Use Topics  . Smoking status: Current Every Day Smoker -- 1.0 packs/day    Types: Cigarettes  . Smokeless tobacco: Never Used  . Alcohol Use: 5.0 oz/week    10 drink(s) per week    OB History    Grav Para Term Preterm Abortions TAB SAB Ect Mult Living   1    1     0      Review of Systems  Constitutional: Negative.  Negative for fever and chills.  Respiratory: Positive for shortness of breath. Negative for cough.   Cardiovascular: Positive for chest pain.  Gastrointestinal: Negative.   Genitourinary: Negative.   Musculoskeletal: Negative.   Skin: Negative.   Neurological: Negative.  Negative for syncope.  Psychiatric/Behavioral: Negative.     Allergies  Aspirin; Black cohosh-soybean oil cap; and Promethazine hcl  Home Medications   Current Outpatient Rx  Name  Route  Sig  Dispense  Refill  . BIOTIN 1 MG PO CAPS   Oral   Take 1 capsule by mouth daily.         Marland Kitchen ESTRADIOL 0.05 MG/24HR TD PTTW  Transdermal   Place 1 patch (0.05 mg total) onto the skin 2 (two) times a week.   24 patch   4   . FLAX SEED OIL PO   Oral   Take 1 capsule by mouth daily.         Marland Kitchen LANSOPRAZOLE 15 MG PO CPDR   Oral   Take 1 capsule (15 mg total) by mouth daily.   90 capsule   0     Pt must make office visit for further refills   . ONE-DAILY MULTI VITAMINS PO TABS   Oral   Take 1 tablet by mouth daily.         . OCUVITE-LUTEIN PO CAPS   Oral   Take 1 capsule by mouth daily.           BP 172/80  Pulse 78  Temp 98.3 F (36.8 C)  Resp 18  SpO2 98%  Physical Exam  Nursing note and vitals reviewed. Constitutional: She is oriented to person, place, and time. She appears well-developed and well-nourished. No distress.  HENT:   Head: Normocephalic and atraumatic.  Right Ear: External ear normal.  Left Ear: External ear normal.  Mouth/Throat: Oropharynx is clear and moist.  Eyes: Conjunctivae normal and EOM are normal. Pupils are equal, round, and reactive to light. No scleral icterus.  Neck: Normal range of motion. Neck supple.  Cardiovascular: Normal rate, regular rhythm and normal heart sounds.   Pulmonary/Chest: Effort normal and breath sounds normal.  Abdominal: Soft. Bowel sounds are normal.  Musculoskeletal: Normal range of motion. She exhibits no edema and no tenderness.  Neurological: She is alert and oriented to person, place, and time.       No sensory or motor deficit.  Skin: Skin is warm and dry.  Psychiatric: She has a normal mood and affect. Her behavior is normal.    ED Course  Procedures (including critical care time)   Labs Reviewed  CBC  BASIC METABOLIC PANEL    Date: 03/03/2012  Rate: 79  Rhythm: normal sinus rhythm  QRS Axis: normal  Intervals: normal PQRS:  Left atrial abnormality  ST/T Wave abnormalities: normal  Conduction Disutrbances:none  Narrative Interpretation: Borderline EKG  Old EKG Reviewed: none available  Results for orders placed during the hospital encounter of 03/03/12  CBC      Component Value Range   WBC 9.5  4.0 - 10.5 K/uL   RBC 4.48  3.87 - 5.11 MIL/uL   Hemoglobin 14.9  12.0 - 15.0 g/dL   HCT 16.1  09.6 - 04.5 %   MCV 94.4  78.0 - 100.0 fL   MCH 33.3  26.0 - 34.0 pg   MCHC 35.2  30.0 - 36.0 g/dL   RDW 40.9  81.1 - 91.4 %   Platelets 276  150 - 400 K/uL  BASIC METABOLIC PANEL      Component Value Range   Sodium 139  135 - 145 mEq/L   Potassium 3.5  3.5 - 5.1 mEq/L   Chloride 100  96 - 112 mEq/L   CO2 25  19 - 32 mEq/L   Glucose, Bld 97  70 - 99 mg/dL   BUN 11  6 - 23 mg/dL   Creatinine, Ser 7.82  0.50 - 1.10 mg/dL   Calcium 9.6  8.4 - 95.6 mg/dL   GFR calc non Af Amer >90  >90 mL/min   GFR calc Af Amer >90  >90 mL/min   Dg Chest 2  View  03/03/2012  *RADIOLOGY REPORT*  Clinical Data: Chest pain.  Acute hypertension.  CHEST - 2 VIEW  Comparison: None.  Findings: Cardiac silhouette normal in size.  Thoracic aorta minimally tortuous.  Hilar and mediastinal contours otherwise unremarkable.  Lungs clear.  Bronchovascular markings normal. Pulmonary vascularity normal.  No pneumothorax.  No pleural effusions.  Visualized bony thorax intact.  IMPRESSION: No acute cardiopulmonary disease.   Original Report Authenticated By: Hulan Saas, M.D.    Troponin I was 0.0.  Lab workup was negative.  Pt released.  She was advised to followup with her internist, Oliver Barre, M.D., who can arrange a stress test for her.  She was also advised to try to stop smoking.     1. Nonspecific chest pain         Carleene Cooper III, MD 03/03/12 2119

## 2012-03-03 NOTE — ED Notes (Signed)
Pt is here with intermittent chest pain tightness for the last week.  No history of htn.  BP was in the 180s systolic at an urgent care.  Pt reports some sob

## 2012-03-05 ENCOUNTER — Encounter: Payer: Self-pay | Admitting: Internal Medicine

## 2012-03-05 ENCOUNTER — Ambulatory Visit (INDEPENDENT_AMBULATORY_CARE_PROVIDER_SITE_OTHER): Payer: Medicare Other | Admitting: Internal Medicine

## 2012-03-05 VITALS — BP 120/68 | HR 73 | Temp 98.0°F | Ht 64.0 in | Wt 120.0 lb

## 2012-03-05 DIAGNOSIS — Z0000 Encounter for general adult medical examination without abnormal findings: Secondary | ICD-10-CM

## 2012-03-05 NOTE — Patient Instructions (Addendum)

## 2012-03-05 NOTE — Progress Notes (Signed)
HPI:  Pt presents to the clinic today for her annual wellness visit. She has no concerns about her health, ADL's or home safety today.  Past Medical History  Diagnosis Date  . Hematuria     FOLLOWED BY UROLOGIST FOR THIS  . Brain aneurysm     Current Outpatient Prescriptions  Medication Sig Dispense Refill  . Biotin 1 MG CAPS Take 1 capsule by mouth daily.      Marland Kitchen estradiol (VIVELLE-DOT) 0.05 MG/24HR Place 1 patch (0.05 mg total) onto the skin 2 (two) times a week.  24 patch  4  . Flaxseed, Linseed, (FLAX SEED OIL PO) Take 1 capsule by mouth daily.      . lansoprazole (PREVACID) 15 MG capsule Take 1 capsule (15 mg total) by mouth daily.  90 capsule  0  . Multiple Vitamin (MULTIVITAMIN) tablet Take 1 tablet by mouth daily.      . psyllium (METAMUCIL) 58.6 % packet Take 1 packet by mouth daily.        Allergies  Allergen Reactions  . Aspirin     REACTION: brain aneurysm  . Black Cohosh-Soybean Oil Cap     REACTION: hives  . Promethazine Hcl     REACTION: rash    Family History  Problem Relation Age of Onset  . Diabetes Mother   . Hypertension Mother   . Heart disease Mother   . Breast cancer Mother     Age 64  . Cancer Sister     MELANOMA  . Breast cancer Sister     Age 50  . Cancer Sister     Melanoma    History   Social History  . Marital Status: Married    Spouse Name: N/A    Number of Children: N/A  . Years of Education: N/A   Occupational History  . Not on file.   Social History Main Topics  . Smoking status: Current Every Day Smoker -- 1.0 packs/day    Types: Cigarettes  . Smokeless tobacco: Never Used  . Alcohol Use: 5.0 oz/week    10 drink(s) per week  . Drug Use: No  . Sexually Active: Yes    Birth Control/ Protection: Surgical   Other Topics Concern  . Not on file   Social History Narrative  . No narrative on file    Hospitiliaztions: Er recently on 03/03/2011 for chest pains, ruled out for MI, will follow up with Dr. Jonny Ruiz and  cardiology  Health Maintenance:    Flu: never  Tetanus: 2010  Pneumovax: never  Zostavax: 2011  Bone Density: 2013  Colonoscopy: 2010  Eye Doctor: 2013  Dental Exam: 2013   Past Medical History  Diagnosis Date  . Hematuria     FOLLOWED BY UROLOGIST FOR THIS  . Brain aneurysm     Current Outpatient Prescriptions  Medication Sig Dispense Refill  . Biotin 1 MG CAPS Take 1 capsule by mouth daily.      Marland Kitchen estradiol (VIVELLE-DOT) 0.05 MG/24HR Place 1 patch (0.05 mg total) onto the skin 2 (two) times a week.  24 patch  4  . Flaxseed, Linseed, (FLAX SEED OIL PO) Take 1 capsule by mouth daily.      . lansoprazole (PREVACID) 15 MG capsule Take 1 capsule (15 mg total) by mouth daily.  90 capsule  0  . Multiple Vitamin (MULTIVITAMIN) tablet Take 1 tablet by mouth daily.      . psyllium (METAMUCIL) 58.6 % packet Take 1 packet by mouth daily.  Allergies  Allergen Reactions  . Aspirin     REACTION: brain aneurysm  . Black Cohosh-Soybean Oil Cap     REACTION: hives  . Promethazine Hcl     REACTION: rash    Family History  Problem Relation Age of Onset  . Diabetes Mother   . Hypertension Mother   . Heart disease Mother   . Breast cancer Mother     Age 67  . Cancer Sister     MELANOMA  . Breast cancer Sister     Age 28  . Cancer Sister     Melanoma    History   Social History  . Marital Status: Married    Spouse Name: N/A    Number of Children: N/A  . Years of Education: N/A   Occupational History  . Not on file.   Social History Main Topics  . Smoking status: Current Every Day Smoker -- 1.0 packs/day    Types: Cigarettes  . Smokeless tobacco: Never Used  . Alcohol Use: 5.0 oz/week    10 drink(s) per week  . Drug Use: No  . Sexually Active: Yes    Birth Control/ Protection: Surgical   Other Topics Concern  . Not on file   Social History Narrative  . No narrative on file    Subjective:   Review of Systems:   Constitutional: Denies fever,  malaise, fatigue, headache or abrupt weight changes.  HEENT: Denies eye pain, eye redness, ear pain, ringing in the ears, wax buildup, runny nose, nasal congestion, bloody nose, or sore throat. Respiratory: Denies difficulty breathing, shortness of breath, cough or sputum production.   Cardiovascular: Pt reports chest tightness. Denies chest pain, palpitations or swelling in the hands or feet.  Gastrointestinal: Denies abdominal pain, bloating, constipation, diarrhea or blood in the stool.  GU: Denies urgency, frequency, pain with urination, burning sensation, blood in urine, odor or discharge. Musculoskeletal: Denies decrease in range of motion, difficulty with gait, muscle pain or joint pain and swelling.  Skin: Denies redness, rashes, lesions or ulcercations.  Neurological: Denies dizziness, difficulty with memory, difficulty with speech or problems with balance and coordination.   No other specific complaints in a complete review of systems (except as listed in HPI above).  Objective:  PE:   BP 120/68  Pulse 73  Temp 98 F (36.7 C) (Oral)  Ht 5\' 4"  (1.626 m)  Wt 120 lb (54.432 kg)  BMI 20.60 kg/m2  SpO2 96% Wt Readings from Last 3 Encounters:  03/05/12 120 lb (54.432 kg)  11/29/11 120 lb (54.432 kg)  10/20/10 122 lb (55.339 kg)    General: Appears her stated age, well developed, well nourished in NAD. Skin: Warm, dry and intact. No rashes, lesions or ulcerations noted. HEENT: Head: normal shape and size; Eyes: sclera white, no icterus, conjunctiva pink, PERRLA and EOMs intact; Ears: Tm's gray and intact, normal light reflex; Nose: mucosa pink and moist, septum midline; Throat/Mouth: Teeth present, mucosa pink and moist, no exudate, lesions or ulcerations noted.  Neck: Normal range of motion. Neck supple, trachea midline. No massses, lumps or thyromegaly present.  Cardiovascular: Normal rate and rhythm. S1,S2 noted.  No murmur, rubs or gallops noted. No JVD or BLE edema. No  carotid bruits noted. Pulmonary/Chest: Normal effort and positive vesicular breath sounds. No respiratory distress. No wheezes, rales or ronchi noted.  Abdomen: Soft and nontender. Normal bowel sounds, no bruits noted. No distention or masses noted. Liver, spleen and kidneys non palpable. Musculoskeletal: Normal range  of motion. No signs of joint swelling. No difficulty with gait.  Neurological: Alert and oriented. Cranial nerves II-XII intact. Coordination normal. +DTRs bilaterally. Psychiatric: Mood and affect normal. Behavior is normal. Judgment and thought content normal.    BMET    Component Value Date/Time   NA 139 03/03/2012 1337   K 3.5 03/03/2012 1337   CL 100 03/03/2012 1337   CO2 25 03/03/2012 1337   GLUCOSE 97 03/03/2012 1337   BUN 11 03/03/2012 1337   CREATININE 0.60 03/03/2012 1337   CALCIUM 9.6 03/03/2012 1337   GFRNONAA >90 03/03/2012 1337   GFRAA >90 03/03/2012 1337    Lipid Panel     Component Value Date/Time   CHOL 267* 01/11/2010 0932   TRIG 170.0* 01/11/2010 0932   HDL 89.00 01/11/2010 0932   CHOLHDL 3 01/11/2010 0932   VLDL 34.0 01/11/2010 0932    CBC    Component Value Date/Time   WBC 9.5 03/03/2012 1337   RBC 4.48 03/03/2012 1337   HGB 14.9 03/03/2012 1337   HCT 42.3 03/03/2012 1337   PLT 276 03/03/2012 1337   MCV 94.4 03/03/2012 1337   MCH 33.3 03/03/2012 1337   MCHC 35.2 03/03/2012 1337   RDW 12.3 03/03/2012 1337   LYMPHSABS 2.8 01/11/2010 0932   MONOABS 0.5 01/11/2010 0932   EOSABS 0.3 01/11/2010 0932   BASOSABS 0.0 01/11/2010 0932    Hgb A1C No results found for this basename: HGBA1C      Assessment and Plan:   Medicare Annual Wellness Visit:  Diet: Heart healthy which includes low salt, low fat Physical activity: Continue your daily exercise program Depression/mood screen: Negative Hearing: Intact to whispered voice Visual acuity: Grossly normal, performs annual eye exam  ADLs: Capable Fall risk: None Home safety: Good Cognitive evaluation: Intact to  orientation, naming, recall and repetition EOL planning: No advance directives of living will in place, full code  I have personally reviewed and have noted 1. The patient's medical and social history 2. Their use of alcohol, tobacco or illicit drugs 3. Their current medications and supplements 4. The patient's functional ability including ADL's, fall risks, home safety risks and hearing or visual impairment. 5. Diet and physical activities 6. Evidence for depression or mood disorders  Preventative Medicine:  Pt declines flu shot today (recommended yearly) Pt declines Pneumovax (recommended every 5 years) Pt UTD with Zostavax (recommended once) Pt UTD with mammogram (recommended yearly) Pt UTD with pap smears (recommended every 5 years) PT UTD with colonoscopy (recommended every 5-10 years, due 2020) Pt UTD with bone density (recommended every1-2 years) Pt UTD with opthamology and dentist (recommended yearly)  Smoking cessation counseling given > 5 min, information provided on handout  Next appointment:  F/u with Dr. Jonny Ruiz tomorrow for ER f/u

## 2012-03-07 ENCOUNTER — Ambulatory Visit (INDEPENDENT_AMBULATORY_CARE_PROVIDER_SITE_OTHER): Payer: Medicare Other | Admitting: Internal Medicine

## 2012-03-07 ENCOUNTER — Encounter: Payer: Self-pay | Admitting: Internal Medicine

## 2012-03-07 ENCOUNTER — Other Ambulatory Visit: Payer: Self-pay | Admitting: Internal Medicine

## 2012-03-07 ENCOUNTER — Other Ambulatory Visit (INDEPENDENT_AMBULATORY_CARE_PROVIDER_SITE_OTHER): Payer: Medicare Other

## 2012-03-07 VITALS — BP 140/80 | HR 70 | Temp 97.5°F | Ht 63.0 in | Wt 120.4 lb

## 2012-03-07 DIAGNOSIS — Z0001 Encounter for general adult medical examination with abnormal findings: Secondary | ICD-10-CM | POA: Insufficient documentation

## 2012-03-07 DIAGNOSIS — I671 Cerebral aneurysm, nonruptured: Secondary | ICD-10-CM | POA: Insufficient documentation

## 2012-03-07 DIAGNOSIS — E785 Hyperlipidemia, unspecified: Secondary | ICD-10-CM

## 2012-03-07 DIAGNOSIS — K219 Gastro-esophageal reflux disease without esophagitis: Secondary | ICD-10-CM

## 2012-03-07 DIAGNOSIS — R079 Chest pain, unspecified: Secondary | ICD-10-CM | POA: Insufficient documentation

## 2012-03-07 DIAGNOSIS — Z0000 Encounter for general adult medical examination without abnormal findings: Secondary | ICD-10-CM | POA: Insufficient documentation

## 2012-03-07 LAB — HEPATIC FUNCTION PANEL
ALT: 19 U/L (ref 0–35)
Albumin: 4.2 g/dL (ref 3.5–5.2)
Total Protein: 6.8 g/dL (ref 6.0–8.3)

## 2012-03-07 LAB — LDL CHOLESTEROL, DIRECT: Direct LDL: 136 mg/dL

## 2012-03-07 LAB — LIPID PANEL
Cholesterol: 257 mg/dL — ABNORMAL HIGH (ref 0–200)
HDL: 64.6 mg/dL (ref >39.00)
Triglycerides: 286 mg/dL — ABNORMAL HIGH (ref 0.0–149.0)
VLDL: 57.2 mg/dL — ABNORMAL HIGH (ref 0.0–40.0)

## 2012-03-07 LAB — TSH: TSH: 1.24 u[IU]/mL (ref 0.35–5.50)

## 2012-03-07 MED ORDER — ATORVASTATIN CALCIUM 10 MG PO TABS: 10.0000 mg | ORAL_TABLET | Freq: Every day | ORAL | Status: DC

## 2012-03-07 NOTE — Progress Notes (Addendum)
Subjective:    Patient ID: Isabel Christensen, female    DOB: Jun 08, 1945, 67 y.o.   MRN: [id number]  HPI  Here to f/u after being seen in ER with CP unclear etiology, mild to mod, intermittent, 2 wks, none since jan 1 but also has not been as active as usual with her many hours spent gardening, wt lifting and power walking up to 1-2 miles per day; not assoc with sob, n/v, palp, diaphoresis, dizziness. Pt denies wheezing, orthopnea, PND, increased LE swelling, syncope.  No overt reflux, nonpleuritic.  Recent cbc. Cmet, cxr in ER neg for acute. Denies worsening reflux, dysphagia, abd pain, n/v, bowel change or blood. Past Medical History  Diagnosis Date  . Hematuria     FOLLOWED BY UROLOGIST FOR THIS  . Brain aneurysm   . HYPERLIPIDEMIA 06/05/2007    Qualifier: Diagnosis of  By: Jonny Ruiz MD, Len Blalock   . GERD 06/05/2007    Qualifier: Diagnosis of  By: Maris Berger   . GOITER 03/03/2008    Qualifier: Diagnosis of  By: Jonny Ruiz MD, Len Blalock   . DIVERTICULOSIS, COLON 06/05/2007    Qualifier: Diagnosis of  By: Jonny Ruiz MD, Len Blalock    Past Surgical History  Procedure Date  . Nose surgery   . Vaginal hysterectomy 1976    FOR STERILIZATION  . Oophorectomy     reports that she has been smoking Cigarettes.  She has been smoking about 1 pack per day. She has never used smokeless tobacco. She reports that she drinks about 5 ounces of alcohol per week. She reports that she does not use illicit drugs. family history includes Breast cancer in her mother and sister; Cancer in her sisters; Diabetes in her mother; Heart disease in her mother; and Hypertension in her mother. Allergies  Allergen Reactions  . Aspirin     REACTION: brain aneurysm  . Black Cohosh-Soybean Oil Cap     REACTION: hives  . Promethazine Hcl     REACTION: rash   Current Outpatient Prescriptions on File Prior to Visit  Medication Sig Dispense Refill  . Biotin 1 MG CAPS Take 1 capsule by mouth daily.      Marland Kitchen estradiol (VIVELLE-DOT) 0.05  MG/24HR Place 1 patch (0.05 mg total) onto the skin 2 (two) times a week.  24 patch  4  . Flaxseed, Linseed, (FLAX SEED OIL PO) Take 1 capsule by mouth daily.      . lansoprazole (PREVACID) 15 MG capsule Take 1 capsule (15 mg total) by mouth daily.  90 capsule  0  . Multiple Vitamin (MULTIVITAMIN) tablet Take 1 tablet by mouth daily.      . psyllium (METAMUCIL) 58.6 % packet Take 1 packet by mouth daily.      Marland Kitchen atorvastatin (LIPITOR) 10 MG tablet Take 1 tablet (10 mg total) by mouth daily.  90 tablet  3   Review of Systems  Constitutional: Negative for diaphoresis and unexpected weight change.  HENT: Negative for tinnitus.   Eyes: Negative for photophobia and visual disturbance.  Respiratory: Negative for choking and stridor.   Gastrointestinal: Negative for vomiting and blood in stool.  Genitourinary: Negative for hematuria and decreased urine volume.  Musculoskeletal: Negative for gait problem.  Skin: Negative for color change and wound.  Neurological: Negative for tremors and numbness.  Psychiatric/Behavioral: Negative for decreased concentration. The patient is not hyperactive.        Objective:   Physical Exam BP 140/80  Pulse 70  Temp 97.5 F (  36.4 C) (Oral)  Ht 5\' 3"  (1.6 m)  Wt 120 lb 6 oz (54.602 kg)  BMI 21.32 kg/m2  SpO2 98% Physical Exam  VS noted Constitutional: Pt appears well-developed and well-nourished.  HENT: Head: Normocephalic.  Right Ear: External ear normal.  Left Ear: External ear normal.  Eyes: Conjunctivae and EOM are normal. Pupils are equal, round, and reactive to light.  Neck: Normal range of motion. Neck supple.  Cardiovascular: Normal rate and regular rhythm.   Pulmonary/Chest: Effort normal and breath sounds normal.  Abd:  Soft, NT, non-distended, + BS Neurological: Pt is alert. Not confused  Skin: Skin is warm. No erythema.  Psychiatric: Pt behavior is normal. Thought content normal.     Assessment & Plan:  Quality Measures  addressed:  Pneumonia Vaccine: pt declines, may self-refer to local pharmacy

## 2012-03-07 NOTE — Assessment & Plan Note (Signed)
stable overall by hx and exam, most recent data reviewed with pt, and pt to continue medical treatment as before Lab Results  Component Value Date   WBC 9.5 03/03/2012   HGB 14.9 03/03/2012   HCT 42.3 03/03/2012   PLT 276 03/03/2012   GLUCOSE 97 03/03/2012   CHOL 257* 03/07/2012   TRIG 286.0* 03/07/2012   HDL 64.60 03/07/2012   LDLDIRECT 136.0 03/07/2012   ALT 19 03/07/2012   AST 23 03/07/2012   NA 139 03/03/2012   K 3.5 03/03/2012   CL 100 03/03/2012   CREATININE 0.60 03/03/2012   BUN 11 03/03/2012   CO2 25 03/03/2012   TSH 1.24 03/07/2012

## 2012-03-07 NOTE — Patient Instructions (Addendum)
Continue all other medications as before Your lansoprazole was sent to [facility]  Please have the pharmacy call with any other refills you may need. You will be contacted regarding the referral for: cardiology - Dr Eden Emms if possible, and Stress Test Please go to LAB in the Basement for the blood and/or urine tests to be done today You will be contacted by phone if any changes need to be made immediately.  Otherwise, you will receive a letter about your results with an explanation Please remember to sign up for My Chart at your earliest convenience, as this will be important to you in the future with finding out test results. Please return in 1 year for your yearly visit, or sooner if needed

## 2012-03-07 NOTE — Assessment & Plan Note (Signed)
For lipids, consider start lipitor

## 2012-03-07 NOTE — Assessment & Plan Note (Signed)
Atypical, etiology unclear, ecg/cxr/recent ER labs reviewed with pt, given age and hx will ask for stress test, card referral per pt request.

## 2012-03-08 ENCOUNTER — Encounter: Payer: Medicare Other | Admitting: Internal Medicine

## 2012-03-10 ENCOUNTER — Other Ambulatory Visit: Payer: Self-pay | Admitting: Internal Medicine

## 2012-03-11 ENCOUNTER — Encounter (HOSPITAL_COMMUNITY): Payer: Medicare Other

## 2012-03-12 ENCOUNTER — Encounter (HOSPITAL_COMMUNITY): Payer: Medicare Other

## 2012-03-12 ENCOUNTER — Ambulatory Visit (HOSPITAL_COMMUNITY): Payer: Medicare Other | Attending: Cardiology | Admitting: Radiology

## 2012-03-12 VITALS — BP 166/99 | HR 64 | Ht 63.0 in | Wt 119.0 lb

## 2012-03-12 DIAGNOSIS — R079 Chest pain, unspecified: Secondary | ICD-10-CM

## 2012-03-12 DIAGNOSIS — R0789 Other chest pain: Secondary | ICD-10-CM | POA: Insufficient documentation

## 2012-03-12 DIAGNOSIS — R002 Palpitations: Secondary | ICD-10-CM | POA: Insufficient documentation

## 2012-03-12 DIAGNOSIS — R0602 Shortness of breath: Secondary | ICD-10-CM

## 2012-03-12 DIAGNOSIS — R000 Tachycardia, unspecified: Secondary | ICD-10-CM | POA: Insufficient documentation

## 2012-03-12 DIAGNOSIS — R42 Dizziness and giddiness: Secondary | ICD-10-CM | POA: Insufficient documentation

## 2012-03-12 DIAGNOSIS — F17200 Nicotine dependence, unspecified, uncomplicated: Secondary | ICD-10-CM | POA: Insufficient documentation

## 2012-03-12 MED ORDER — TECHNETIUM TC 99M SESTAMIBI GENERIC - CARDIOLITE
11.0000 | Freq: Once | INTRAVENOUS | Status: AC | PRN
  Administered 2012-03-12: 11 via INTRAVENOUS

## 2012-03-12 MED ORDER — TECHNETIUM TC 99M SESTAMIBI GENERIC - CARDIOLITE
33.0000 | Freq: Once | INTRAVENOUS | Status: AC | PRN
  Administered 2012-03-12: 33 via INTRAVENOUS

## 2012-03-12 NOTE — Progress Notes (Signed)
[facility] SITE 3 NUCLEAR MED [address] [place], [place] [address] [phone]    Cardiology Nuclear Med Study  Isabel Christensen is a 67 y.o. female     MRN : [id number]     DOB: [birth year]  Procedure Date: 03/12/2012  Nuclear Med Background Indication for Stress Test:  Evaluation for Ischemia and Patient seen in hospital on 03-02-12 for Chest Pain with negative enzymes. History:  No documented CAD. Cardiac Risk Factors: Family History - CAD, Lipids and Smoker  Symptoms:  Chest Pain/Pressure/Tightness (last episode of chest discomfort was this am), Dizziness, Palpitations, Rapid HR and SOB   Nuclear Pre-Procedure Caffeine/Decaff Intake:  None NPO After: 8:00pm   Lungs:  Clear. O2 Sat: 98% on room air. IV 0.9% NS with Angio Cath:  20g  IV Site: R Antecubital x 1, tolerated well IV Started by:  Irean Hong, RN  Chest Size (in):  36 Cup Size: B  Height: 5\' 3"  (1.6 m)  Weight:  119 lb (53.978 kg)  BMI:  Body mass index is 21.08 kg/(m^2). Tech Comments:  n/a    Nuclear Med Study 1 or 2 day study: 1 day  Stress Test Type:  Stress  Reading MD: Olga Millers, MD  Order Authorizing Provider:  Oliver Barre, MD  Resting Radionuclide: Technetium 7m Sestamibi  Resting Radionuclide Dose: 11.0 mCi   Stress Radionuclide:  Technetium 5m Sestamibi  Stress Radionuclide Dose: 33.0 mCi           Stress Protocol Rest HR: 64 Stress HR: 148  Rest BP: 166/99 Stress BP: 224/127  Exercise Time (min): 9:01 METS: 10.1   Predicted Max HR: 154 bpm % Max HR: 96.1 bpm Rate Pressure Product: 86578    Dose of Adenosine (mg):  n/a Dose of Lexiscan: n/a mg  Dose of Atropine (mg): n/a Dose of Dobutamine: n/a mcg/kg/min (at max HR)  Stress Test Technologist: Cathlyn Parsons, RN  Nuclear Technologist:  Domenic Polite, CNMT     Rest Procedure:  Myocardial perfusion imaging was performed at rest 45 minutes following the intravenous administration of Technetium 69m Sestamibi.  Rest  ECG: NSR - Normal EKG  Stress Procedure:  The patient exercised on the treadmill utilizing the Bruce Protocol for 9:01 minutes. The patient stopped due to a hypertensive response of 224/127 and denied any chest pain.  Technetium 79m Sestamibi was injected at peak exercise and myocardial perfusion imaging was performed after a brief delay.  Stress ECG: No significant ST segment change suggestive of ischemia.  QPS Raw Data Images:  There is interference from nuclear activity from structures below the diaphragm. This does not affect the ability to read the study. Stress Images:  Normal homogeneous uptake in all areas of the myocardium. Rest Images:  Normal homogeneous uptake in all areas of the myocardium. Subtraction (SDS):  No evidence of ischemia. Transient Ischemic Dilatation (Normal <1.22):  0.99 Lung/Heart Ratio (Normal <0.45):  0.31  Quantitative Gated Spect Images QGS EDV: 51 ml QGS ESV:  11 ml  Impression Exercise Capacity:  Good exercise capacity. BP Response:  Hypertensive blood pressure response. Clinical Symptoms:  There is dyspnea. ECG Impression:  No significant ST segment change suggestive of ischemia. Comparison with Prior Nuclear Study: No previous nuclear study performed  Overall Impression:  Normal stress nuclear study.  LV Ejection Fraction: 78%.  LV Wall Motion:  NL LV Function; NL Wall Motion   Olga Millers

## 2012-03-13 ENCOUNTER — Encounter: Payer: Self-pay | Admitting: Internal Medicine

## 2012-03-22 ENCOUNTER — Encounter: Payer: Self-pay | Admitting: Cardiovascular Disease

## 2012-03-22 ENCOUNTER — Ambulatory Visit (INDEPENDENT_AMBULATORY_CARE_PROVIDER_SITE_OTHER): Payer: Medicare Other | Admitting: Cardiovascular Disease

## 2012-03-22 VITALS — BP 165/84 | HR 83 | Ht 63.0 in | Wt 122.0 lb

## 2012-03-22 DIAGNOSIS — E785 Hyperlipidemia, unspecified: Secondary | ICD-10-CM

## 2012-03-22 DIAGNOSIS — R079 Chest pain, unspecified: Secondary | ICD-10-CM

## 2012-03-22 NOTE — Patient Instructions (Signed)
Your physician recommends that you schedule a follow-up appointment in:  1 MONTH WITH DR [facility] Your physician recommends that you continue on your current medications as directed. Please refer to the Current Medication list given to you today.  Your physician has requested that you have cardiac CT. Cardiac computed tomography (CT) is a painless test that uses an x-ray machine to take clear, detailed pictures of your heart. For further information please visit [email or web address] Please follow instruction sheet as given.  DX CHEST PAIN DESPITE NORMAL  MYOVIEW

## 2012-03-22 NOTE — Assessment & Plan Note (Signed)
LDL was 136  If calcium score is high she will have more incentive to take statin

## 2012-03-22 NOTE — Progress Notes (Signed)
Patient ID: Isabel Christensen, female   DOB: 10-20-1945, 67 y.o.   MRN: [id number] 67 yo referred by Dr Jonny Ruiz for persistant chest pain despite normal myovue.  She has had pain since January.  Is somewhat atypical.  Left side chest. No radiation.  Intermitant all day long. Sometimes worse with exertion but not always.  No GI overtones.  BP has been running high.  Has elevated cholesterol but does not want to take statin.  No known vascular disease. No pleuritic component to pain.  No history of PUD/GERD  ROS: Denies fever, malais, weight loss, blurry vision, decreased visual acuity, cough, sputum, SOB, hemoptysis, pleuritic pain, palpitaitons, heartburn, abdominal pain, melena, lower extremity edema, claudication, or rash.  All other systems reviewed and negative   General: Affect appropriate Healthy:  appears stated age HEENT: normal Neck supple with no adenopathy JVP normal no bruits no thyromegaly Lungs clear with no wheezing and good diaphragmatic motion Heart:  S1/S2 no murmur,rub, gallop or click PMI normal Abdomen: benighn, BS positve, no tenderness, no AAA no bruit.  No HSM or HJR Distal pulses intact with no bruits No edema Neuro non-focal Skin warm and dry No muscular weakness  Medications Current Outpatient Prescriptions  Medication Sig Dispense Refill  . Biotin 1 MG CAPS Take 1 capsule by mouth daily.      Marland Kitchen estradiol (VIVELLE-DOT) 0.05 MG/24HR Place 1 patch (0.05 mg total) onto the skin 2 (two) times a week.  24 patch  4  . Flaxseed, Linseed, (FLAX SEED OIL PO) Take 1 capsule by mouth daily.      . lansoprazole (PREVACID) 15 MG capsule TAKE 1 CAPSULE EVERY DAY  90 capsule  3  . Misc Natural Products (LUTEIN 20) CAPS Take 1 tablet by mouth daily.      . Multiple Vitamin (MULTIVITAMIN) tablet Take 1 tablet by mouth daily.      . psyllium (METAMUCIL) 58.6 % packet Take 1 packet by mouth daily.        Allergies Aspirin; Black cohosh-soybean oil cap; and Promethazine hcl  Family  History: Family History  Problem Relation Age of Onset  . Diabetes Mother   . Hypertension Mother   . Heart disease Mother   . Breast cancer Mother     Age 2  . Cancer Sister     MELANOMA  . Breast cancer Sister     Age 61  . Cancer Sister     Melanoma    Social History: History   Social History  . Marital Status: Married    Spouse Name: N/A    Number of Children: N/A  . Years of Education: N/A   Occupational History  . Not on file.   Social History Main Topics  . Smoking status: Current Every Day Smoker -- 1.0 packs/day    Types: Cigarettes  . Smokeless tobacco: Never Used  . Alcohol Use: 5.0 oz/week    10 drink(s) per week  . Drug Use: No  . Sexually Active: Yes    Birth Control/ Protection: Surgical   Other Topics Concern  . Not on file   Social History Narrative  . No narrative on file    Electrocardiogram: 03/03/12 SR rate 79 possible LAE poor R wave progression  Assessment and Plan

## 2012-03-22 NOTE — Assessment & Plan Note (Signed)
Reviewed myovue done 1/15  Normal Exercised about 9 minutes.  Despite this she has continued unexplained pain.  Invasive cath not indicated and patient does not want.  Will do cardiac CT if aproved.  Will be able to r/o CAD and assess need for statin

## 2012-04-03 ENCOUNTER — Other Ambulatory Visit: Payer: Self-pay | Admitting: *Deleted

## 2012-04-03 ENCOUNTER — Encounter: Payer: Self-pay | Admitting: Cardiovascular Disease

## 2012-04-03 DIAGNOSIS — Z79899 Other long term (current) drug therapy: Secondary | ICD-10-CM

## 2012-04-03 DIAGNOSIS — R079 Chest pain, unspecified: Secondary | ICD-10-CM

## 2012-04-04 ENCOUNTER — Encounter: Payer: Self-pay | Admitting: Cardiovascular Disease

## 2012-04-05 ENCOUNTER — Other Ambulatory Visit: Payer: Medicare Other

## 2012-04-09 ENCOUNTER — Other Ambulatory Visit (INDEPENDENT_AMBULATORY_CARE_PROVIDER_SITE_OTHER): Payer: Medicare Other

## 2012-04-09 DIAGNOSIS — Z79899 Other long term (current) drug therapy: Secondary | ICD-10-CM

## 2012-04-09 DIAGNOSIS — R079 Chest pain, unspecified: Secondary | ICD-10-CM

## 2012-04-09 LAB — BASIC METABOLIC PANEL
CO2: 28 mEq/L (ref 19–32)
Calcium: 9.3 mg/dL (ref 8.4–10.5)
Creatinine, Ser: 0.6 mg/dL (ref 0.4–1.2)
GFR: 103.97 mL/min (ref >60.00)
Sodium: 136 mEq/L (ref 135–145)

## 2012-04-18 ENCOUNTER — Ambulatory Visit (HOSPITAL_COMMUNITY)
Admission: RE | Admit: 2012-04-18 | Discharge: 2012-04-18 | Disposition: A | Discharge status: Home / Self Care | Payer: Medicare Other | Source: Ambulatory Visit | Attending: Cardiovascular Disease | Admitting: Cardiovascular Disease

## 2012-04-18 ENCOUNTER — Encounter (HOSPITAL_COMMUNITY): Payer: Self-pay

## 2012-04-18 DIAGNOSIS — R079 Chest pain, unspecified: Secondary | ICD-10-CM

## 2012-04-18 DIAGNOSIS — K7689 Other specified diseases of liver: Secondary | ICD-10-CM | POA: Insufficient documentation

## 2012-04-18 DIAGNOSIS — R0789 Other chest pain: Secondary | ICD-10-CM | POA: Insufficient documentation

## 2012-04-18 DIAGNOSIS — I2510 Atherosclerotic heart disease of native coronary artery without angina pectoris: Secondary | ICD-10-CM | POA: Insufficient documentation

## 2012-04-18 DIAGNOSIS — K449 Diaphragmatic hernia without obstruction or gangrene: Secondary | ICD-10-CM | POA: Insufficient documentation

## 2012-04-18 MED ORDER — IOHEXOL 350 MG/ML SOLN
80.0000 mL | Freq: Once | INTRAVENOUS | Status: AC | PRN
  Administered 2012-04-18: 80 mL via INTRAVENOUS

## 2012-04-18 MED ORDER — METOPROLOL TARTRATE 1 MG/ML IV SOLN
INTRAVENOUS | Status: AC
  Administered 2012-04-18: 5 mg
  Filled 2012-04-18: qty 15

## 2012-04-18 MED ORDER — METOPROLOL TARTRATE 1 MG/ML IV SOLN
5.0000 mg | INTRAVENOUS | Status: DC | PRN
  Administered 2012-04-18 (×4): 5 mg via INTRAVENOUS

## 2012-04-18 MED ORDER — NITROGLYCERIN 0.4 MG SL SUBL
SUBLINGUAL_TABLET | SUBLINGUAL | Status: AC
  Administered 2012-04-18: 13:00:00
  Filled 2012-04-18: qty 25

## 2012-04-18 MED ORDER — METOPROLOL TARTRATE 1 MG/ML IV SOLN
INTRAVENOUS | Status: AC
  Filled 2012-04-18: qty 5

## 2012-04-18 MED ORDER — METOPROLOL TARTRATE 1 MG/ML IV SOLN
INTRAVENOUS | Status: AC
  Filled 2012-04-18: qty 10

## 2012-04-23 ENCOUNTER — Ambulatory Visit (INDEPENDENT_AMBULATORY_CARE_PROVIDER_SITE_OTHER): Payer: Medicare Other | Admitting: Cardiovascular Disease

## 2012-04-23 ENCOUNTER — Encounter: Payer: Self-pay | Admitting: Cardiovascular Disease

## 2012-04-23 VITALS — BP 144/76 | HR 74 | Ht 63.0 in | Wt 122.6 lb

## 2012-04-23 DIAGNOSIS — R079 Chest pain, unspecified: Secondary | ICD-10-CM

## 2012-04-23 DIAGNOSIS — E785 Hyperlipidemia, unspecified: Secondary | ICD-10-CM

## 2012-04-23 DIAGNOSIS — F17200 Nicotine dependence, unspecified, uncomplicated: Secondary | ICD-10-CM | POA: Insufficient documentation

## 2012-04-23 NOTE — Progress Notes (Signed)
Patient ID: Isabel Christensen, female   DOB: 1945/11/12, 67 y.o.   MRN: [id number] 67 yo referred by Dr Jonny Ruiz for persistant chest pain despite normal myovue. She has had pain since January. Is somewhat atypical. Left side chest. No radiation. Intermitant all day long. Sometimes worse with exertion but not always. No GI overtones. BP has been running high. Has elevated cholesterol but does not want to take statin. No known vascular disease. No pleuritic component to pain. No history of PUD/GERD  Myovue 1/9 normal EF 78% Cardiac CT 1/24 no significant CAD calcium score 54 in LAD 70th percentile for age and sex matched controls  Reviewed images with paitent and discussed risks of future MI from smoking and soft plaque ruptue.  She is trying nicorette gum   ROS: Denies fever, malais, weight loss, blurry vision, decreased visual acuity, cough, sputum, SOB, hemoptysis, pleuritic pain, palpitaitons, heartburn, abdominal pain, melena, lower extremity edema, claudication, or rash.  All other systems reviewed and negative  General: Affect appropriate Healthy:  appears stated age HEENT: normal Neck supple with no adenopathy JVP normal no bruits no thyromegaly Lungs clear with no wheezing and good diaphragmatic motion Heart:  S1/S2 no murmur, no rub, gallop or click PMI normal Abdomen: benighn, BS positve, no tenderness, no AAA no bruit.  No HSM or HJR Distal pulses intact with no bruits No edema Neuro non-focal Skin warm and dry No muscular weakness   Current Outpatient Prescriptions  Medication Sig Dispense Refill  . Biotin 1 MG CAPS Take 1 capsule by mouth daily.      Marland Kitchen estradiol (VIVELLE-DOT) 0.05 MG/24HR Place 1 patch (0.05 mg total) onto the skin 2 (two) times a week.  24 patch  4  . Flaxseed, Linseed, (FLAX SEED OIL PO) Take 1 capsule by mouth daily.      . lansoprazole (PREVACID) 15 MG capsule TAKE 1 CAPSULE EVERY DAY  90 capsule  3  . Misc Natural Products (LUTEIN 20) CAPS Take 1 tablet by  mouth daily.      . Multiple Vitamin (MULTIVITAMIN) tablet Take 1 tablet by mouth daily.      . psyllium (METAMUCIL) 58.6 % packet Take 1 packet by mouth daily.       No current facility-administered medications for this visit.    Allergies  Aspirin; Black cohosh-soybean oil cap; and Promethazine hcl  Electrocardiogram:  SR rate 79 LAE poor R wave progression  03/04/12  Assessment and Plan

## 2012-04-23 NOTE — Assessment & Plan Note (Signed)
Counseled for less than 10 minutes. Starting nicorette gum  Risk of cancer and vascular disease discussed

## 2012-04-23 NOTE — Assessment & Plan Note (Signed)
Cholesterol is at goal.  Continue current dose of statin and diet Rx.  No myalgias or side effects.  F/U  LFT's in 6 months. No results found for this basename: LDLCALC  Generic lipitor started last month by primary

## 2012-04-23 NOTE — Assessment & Plan Note (Signed)
Normal myovue and cardiac CT with no occlusive disease.  Noncardiac pain

## 2012-05-14 ENCOUNTER — Encounter: Payer: Self-pay | Admitting: Gynecology

## 2013-01-08 ENCOUNTER — Ambulatory Visit (INDEPENDENT_AMBULATORY_CARE_PROVIDER_SITE_OTHER): Payer: Medicare Other | Admitting: Gynecology

## 2013-01-08 ENCOUNTER — Encounter: Payer: Self-pay | Admitting: Gynecology

## 2013-01-08 VITALS — BP 130/88 | Ht 63.0 in | Wt 118.0 lb

## 2013-01-08 DIAGNOSIS — N952 Postmenopausal atrophic vaginitis: Secondary | ICD-10-CM

## 2013-01-08 DIAGNOSIS — M8580 Other specified disorders of bone density and structure, unspecified site: Secondary | ICD-10-CM

## 2013-01-08 DIAGNOSIS — F17200 Nicotine dependence, unspecified, uncomplicated: Secondary | ICD-10-CM

## 2013-01-08 DIAGNOSIS — Z79890 Hormone replacement therapy (postmenopausal): Secondary | ICD-10-CM

## 2013-01-08 DIAGNOSIS — M899 Disorder of bone, unspecified: Secondary | ICD-10-CM

## 2013-01-08 MED ORDER — ESTRADIOL 0.05 MG/24HR TD PTTW: 1.0000 | MEDICATED_PATCH | TRANSDERMAL | Status: DC

## 2013-01-08 NOTE — Progress Notes (Signed)
Isabel Christensen 1945-03-08 [id number]   History:    67 y.o.  for GYN exam who was previously been followed by my partner who is retired. Patient has been on Vivelle-Dot 0.05 mg transdermally twice a week for vasomotor symptoms and vaginal dryness. Patient does smoke half a pack of cigarettes per day along with 3-0 chromic beverages daily and is not taking calcium with vitamin D. Her shingles vaccine T. That vaccine are up to date. Patient declined flu vaccine. She had a colonoscopy in 2011 she was not certain whether she had a small benign polyp but she was informed that she was on a 10 year recall. Patient denies any prior history of abnormal Pap smears. She did bring up to my attention in both her mother and sister both had breast cancer at the age of 30. No family history of ovarian or colorectal cancer reported. Patient had a vaginal hysterectomy back in 1976 for benign indications. She also has had history in the past of microscopic hematuria and had been followed by the urologist.  Past medical history,surgical history, family history and social history were all reviewed and documented in the EPIC chart.  Gynecologic History No LMP recorded. Patient has had a hysterectomy. Contraception: post menopausal status Last Pap: 2012. Results were: normal Last mammogram: 2014. Results were: normal  Obstetric History OB History  Gravida Para Term Preterm AB SAB TAB Ectopic Multiple Living  1    1     0    # Outcome Date GA Lbr Len/2nd Weight Sex Delivery Anes PTL Lv  1 ABT                ROS: A ROS was performed and pertinent positives and negatives are included in the history.  GENERAL: No fevers or chills. HEENT: No change in vision, no earache, sore throat or sinus congestion. NECK: No pain or stiffness. CARDIOVASCULAR: No chest pain or pressure. No palpitations. PULMONARY: No shortness of breath, cough or wheeze. GASTROINTESTINAL: No abdominal pain, nausea, vomiting or diarrhea, melena or bright  red blood per rectum. GENITOURINARY: No urinary frequency, urgency, hesitancy or dysuria. MUSCULOSKELETAL: No joint or muscle pain, no back pain, no recent trauma. DERMATOLOGIC: No rash, no itching, no lesions. ENDOCRINE: No polyuria, polydipsia, no heat or cold intolerance. No recent change in weight. HEMATOLOGICAL: No anemia or easy bruising or bleeding. NEUROLOGIC: No headache, seizures, numbness, tingling or weakness. PSYCHIATRIC: No depression, no loss of interest in normal activity or change in sleep pattern.     Exam: chaperone present  BP 130/88  Ht 5\' 3"  (1.6 m)  Wt 118 lb (53.524 kg)  BMI 20.91 kg/m2  Body mass index is 20.91 kg/(m^2).  General appearance : Well developed well nourished female. No acute distress HEENT: Neck supple, trachea midline, no carotid bruits, no thyroidmegaly Lungs: Clear to auscultation, no rhonchi or wheezes, or rib retractions  Heart: Regular rate and rhythm, no murmurs or gallops Breast:Examined in sitting and supine position were symmetrical in appearance, no palpable masses or tenderness,  no skin retraction, no nipple inversion, no nipple discharge, no skin discoloration, no axillary or supraclavicular lymphadenopathy Abdomen: no palpable masses or tenderness, no rebound or guarding Extremities: no edema or skin discoloration or tenderness  Pelvic:  Bartholin, Urethra, Skene Glands: Within normal limits             Vagina: No gross lesions or discharge  Cervix: absent  Uterus  Absent  Adnexa  Without masses or tenderness  Anus and  perineum  normal   Rectovaginal  normal sphincter tone without palpated masses or tenderness             Hemoccult PCP     Assessment/Plan:  67 y.o. female on transdermal ERT for severe vasomotor symptoms. We had a lengthy discussion on the women's health initiative study as well as potential risk of breast cancer. We also discussed her risk of developing a DVT and pulmonary embolism while on ERT and smoking. She is  trying to make an effort to stop smoking. Would also informed her that she needed to decrease her alcohol intake because it could worsen her osteopenia I could lead to osteoporosis. She is scheduled for a bone density study next year. As an alternative to ERT  I have given her sample and information on Relize which is a nonhormonal plan base medication that will help alleviate her menopausal symptoms and has no estrogenic effect. She will try this once she finishes her current prescription of the Vivelle-Dot. We discussed the importance of calcium and vitamin D in regular exercise for osteoporosis prevention. Her PCP is Dr. Oliver Barre who has been doing her blood work. Pap smear was not done today in accordance with a new guidelines.  Note: This dictation was prepared with  Dragon/digital dictation along withSmart phrase technology. Any transcriptional errors that result from this process are unintentional.   Ok Edwards MD, 4:19 PM 01/08/2013

## 2013-01-08 NOTE — Patient Instructions (Signed)
Will call you with appointment for genetic counselor.  BRCA-1 and BRCA-2 BRCA-1 and BRCA-2 are 2 genes that are linked with hereditary breast and ovarian cancers. About 200,000 women are diagnosed with invasive breast cancer each year and about 23,000 with ovarian cancer (according to the American Cancer Society). Of these cancers, about 5% to 10% will be due to a mutation in one of the BRCA genes. Men can also inherit an increased risk of developing breast cancer, primarily from an alteration in the BRCA-2 gene.  Individuals with mutations in BRCA1 or BRCA2 have significantly elevated risks for breast cancer (up to 80% lifetime risk), ovarian cancer (up to 40% lifetime risk), bilateral breast cancer and other types of cancers. BRCA mutations are inherited and passed from generation to generation. One half of the time, they are passed from the father's side of the family.  The DNA in white blood cells is used to detect mutations in the BRCA genes. While the gene products (proteins) of the BRCA genes act only in breast and ovarian tissue, the genes are present in every cell of the body and blood is the most easily accessible source of that DNA. PREPARATION FOR TEST The test for BRCA mutations is done on a blood sample collected by needle from a vein in the arm. The test does not require surgical biopsy of breast or ovarian tissue.  NORMAL FINDINGS No genetic mutations. Ranges for normal findings may vary among different laboratories and hospitals. You should always check with your doctor after having lab work or other tests done to discuss the meaning of your test results and whether your values are considered within normal limits. MEANING OF TEST  Your caregiver will go over the test results with you and discuss the importance and meaning of your results, as well as treatment options and the need for additional tests if necessary. OBTAINING THE TEST RESULTS It is your responsibility to obtain your test  results. Ask the lab or department performing the test when and how you will get your results. OTHER THINGS TO KNOW Your test results may have implications for other family members. When one member of a family is tested for BRCA mutations, issues often arise about how or whether to share this information with other family members. Seek advice from a genetic counselor about communication of result with your family members.  Pre and post test consultation with a health care provider knowledgeable about genetic testing cannot be overemphasized.  There are many issues to be considered when preparing for a genetic test and upon learning the results, and a genetic counselor has the knowledge and experience to help you sort through them.  If the BRCA test is positive, the options include increased frequency of check-ups (e.g., mammography, blood tests for CA-125, or transvaginal ultrasonography); medications that could reduce risk (e.g., oral contraceptives or tamoxifen); or surgical removal of the ovaries or breasts. There are a number of variables involved and it is important to discuss your options with your doctor and genetic counselor. Research studies have reported that for every 1000 women negative for BRCA mutations, between 12 and 45 of them will develop breast cancer by age 37 and between 3 and 4 will develop ovarian cancer by age 48. The risk increases with age. The test can be ordered by a doctor, preferably by one who can also offer genetic counseling. The blood sample will be sent to a laboratory that specializes in BRCA testing. The American Society of Clinical Oncology and  the National Breast Cancer Coalition encourage women seeking the test to participate in long-term outcome studies to help gather information on the effectiveness of different check-up and treatment options. Document Released: 03/09/2004 Document Revised: 05/08/2011 Document Reviewed: 01/20/2008 [facility] Patient Information 2014  [place], [place].

## 2013-07-07 ENCOUNTER — Telehealth: Payer: Self-pay | Admitting: *Deleted

## 2013-07-07 NOTE — Telephone Encounter (Signed)
Prior authorization faxed to [facility] for estradiol 0.05 mg patch. Will wait for response.

## 2013-07-14 NOTE — Telephone Encounter (Signed)
Estradiol 0.05 mg approval # [id number], appeal approved.

## 2013-12-01 ENCOUNTER — Encounter: Payer: Self-pay | Admitting: Gastroenterology

## 2013-12-29 ENCOUNTER — Encounter: Payer: Self-pay | Admitting: Gynecology

## 2014-01-09 ENCOUNTER — Encounter: Payer: BC Managed Care – PPO | Admitting: Gynecology

## 2014-01-15 ENCOUNTER — Ambulatory Visit (INDEPENDENT_AMBULATORY_CARE_PROVIDER_SITE_OTHER): Payer: Medicare HMO | Admitting: Gynecology

## 2014-01-15 ENCOUNTER — Encounter: Payer: Self-pay | Admitting: Gynecology

## 2014-01-15 VITALS — BP 118/76 | Ht 63.25 in | Wt 117.0 lb

## 2014-01-15 DIAGNOSIS — M8580 Other specified disorders of bone density and structure, unspecified site: Secondary | ICD-10-CM

## 2014-01-15 DIAGNOSIS — N952 Postmenopausal atrophic vaginitis: Secondary | ICD-10-CM | POA: Insufficient documentation

## 2014-01-15 DIAGNOSIS — F17200 Nicotine dependence, unspecified, uncomplicated: Secondary | ICD-10-CM

## 2014-01-15 DIAGNOSIS — Z720 Tobacco use: Secondary | ICD-10-CM

## 2014-01-15 DIAGNOSIS — N951 Menopausal and female climacteric states: Secondary | ICD-10-CM

## 2014-01-15 DIAGNOSIS — Z79890 Hormone replacement therapy (postmenopausal): Secondary | ICD-10-CM

## 2014-01-15 MED ORDER — ESTRADIOL 0.0375 MG/24HR TD PTTW: 1.0000 | MEDICATED_PATCH | TRANSDERMAL | Status: DC

## 2014-01-15 NOTE — Progress Notes (Signed)
Isabel Christensen 07/06/1945 [id number]   History:    68 y.o.  presented to the office today for GYN exam and for prescription refill for her transdermal estrogen which is help with her vasomotor symptoms. Patient has been on Vivelle-Dot 0.05 mg transdermally twice a week for vasomotor symptoms and vaginal dryness. Patient does smoke half a pack of cigarettes per day along with 3 alcoholic beverages. She is not taking any supplemental calcium or vitamin D. Her shingles vaccine is up-to-date. She refuses flu vaccine today.She had a colonoscopy in 6948 she was not certain whether she had a small benign polyp but she was informed that she was on a 10 year recall. Patient denies any prior history of abnormal Pap smears. She did bring up to my attention in both her mother and sister both had breast cancer at the age of 43. No family history of ovarian or colorectal cancer reported. Patient had a vaginal hysterectomy back in 1976 for benign indications. She also has had history in the past of microscopic hematuria and had been followed by the urologist. She states that this issue has been addressed and corrected and she is no longer had any episodes of hematuria.  Patient had a bone density study 2013 had demonstrated that the lowest T score was at the left femoral neck with a value of -1.2 and normal Frax analysis. Patient's PCP is Dr. Cathlean Cower who has been doing her blood work.   Past medical history,surgical history, family history and social history were all reviewed and documented in the EPIC chart.  Gynecologic History No LMP recorded. Patient has had a hysterectomy. Contraception: status post hysterectomy Last Pap: 2012. Results were: normal Last mammogram: 2014. Results were: normal  Obstetric History OB History  Gravida Para Term Preterm AB SAB TAB Ectopic Multiple Living  1    1     0    # Outcome Date GA Lbr Len/2nd Weight Sex Delivery Anes PTL Lv  1 AB                ROS: A ROS was  performed and pertinent positives and negatives are included in the history.  GENERAL: No fevers or chills. HEENT: No change in vision, no earache, sore throat or sinus congestion. NECK: No pain or stiffness. CARDIOVASCULAR: No chest pain or pressure. No palpitations. PULMONARY: No shortness of breath, cough or wheeze. GASTROINTESTINAL: No abdominal pain, nausea, vomiting or diarrhea, melena or bright red blood per rectum. GENITOURINARY: No urinary frequency, urgency, hesitancy or dysuria. MUSCULOSKELETAL: No joint or muscle pain, no back pain, no recent trauma. DERMATOLOGIC: No rash, no itching, no lesions. ENDOCRINE: No polyuria, polydipsia, no heat or cold intolerance. No recent change in weight. HEMATOLOGICAL: No anemia or easy bruising or bleeding. NEUROLOGIC: No headache, seizures, numbness, tingling or weakness. PSYCHIATRIC: No depression, no loss of interest in normal activity or change in sleep pattern.     Exam: chaperone present  BP 118/76 mmHg  Ht 5' 3.25" (1.607 m)  Wt 117 lb (53.071 kg)  BMI 20.55 kg/m2  Body mass index is 20.55 kg/(m^2).  General appearance : Well developed well nourished female. No acute distress HEENT: Neck supple, trachea midline, no carotid bruits, no thyroidmegaly Lungs: Clear to auscultation, no rhonchi or wheezes, or rib retractions  Heart: Regular rate and rhythm, no murmurs or gallops Breast:Examined in sitting and supine position were symmetrical in appearance, no palpable masses or tenderness,  no skin retraction, no nipple inversion, no nipple  discharge, no skin discoloration, no axillary or supraclavicular lymphadenopathy Abdomen: no palpable masses or tenderness, no rebound or guarding Extremities: no edema or skin discoloration or tenderness  Pelvic:  Bartholin, Urethra, Skene Glands: Within normal limits             Vagina: No gross lesions or discharge, vaginal atrophy  Cervix: Absent  Uterus  : Absent  Adnexa  Without masses or  tenderness  Anus and perineum  normal   Rectovaginal  normal sphincter tone without palpated masses or tenderness             Hemoccult PCP provides     Assessment/Plan:  68 y.o. female on transdermal Vivelle Dot 0.05 mg which she applies once every 4-5 days. Last year as well as this year we had a lengthy discussion of the concerns that I had because of her smoking and being on estrogen replacement therapy and potential risk for DVT and pulmonary embolism. Patient was started on estrogen approximate 3 years ago and she claims it has changed her quality of life where she no longer has vasomotor symptoms and is sleeping better. She accepts the risk but I will attempt to lower her dose to 0.0375 mg which she applies 1-twice a week. Smoking cessation information provided. We discussed importance of calcium vitamin D and regular exercise for osteoporosis prevention. Patient will schedule her bone density study here in our office in the next few weeks. She will make arrangements to schedule her overdue mammogram. We also discussed importance of monthly self breast examination. We will check a vitamin D level today.   Terrance Mass MD, 10:05 AM 01/15/2014

## 2014-01-15 NOTE — Patient Instructions (Signed)
Smoking Cessation Quitting smoking is important to your health and has many advantages. However, it is not always easy to quit since nicotine is a very addictive drug. Oftentimes, people try 3 times or more before being able to quit. This document explains the best ways for you to prepare to quit smoking. Quitting takes hard work and a lot of effort, but you can do it. ADVANTAGES OF QUITTING SMOKING  You will live longer, feel better, and live better.  Your body will feel the impact of quitting smoking almost immediately.  Within 20 minutes, blood pressure decreases. Your pulse returns to its normal level.  After 8 hours, carbon monoxide levels in the blood return to normal. Your oxygen level increases.  After 24 hours, the chance of having a heart attack starts to decrease. Your breath, hair, and body stop smelling like smoke.  After 48 hours, damaged nerve endings begin to recover. Your sense of taste and smell improve.  After 72 hours, the body is virtually free of nicotine. Your bronchial tubes relax and breathing becomes easier.  After 2 to 12 weeks, lungs can hold more air. Exercise becomes easier and circulation improves.  The risk of having a heart attack, stroke, cancer, or lung disease is greatly reduced.  After 1 year, the risk of coronary heart disease is cut in half.  After 5 years, the risk of stroke falls to the same as a nonsmoker.  After 10 years, the risk of lung cancer is cut in half and the risk of other cancers decreases significantly.  After 15 years, the risk of coronary heart disease drops, usually to the level of a nonsmoker.  If you are pregnant, quitting smoking will improve your chances of having a healthy baby.  The people you live with, especially any children, will be healthier.  You will have extra money to spend on things other than cigarettes. QUESTIONS TO THINK ABOUT BEFORE ATTEMPTING TO QUIT You may want to talk about your answers with your  health care provider.  Why do you want to quit?  If you tried to quit in the past, what helped and what did not?  What will be the most difficult situations for you after you quit? How will you plan to handle them?  Who can help you through the tough times? Your family? Friends? A health care provider?  What pleasures do you get from smoking? What ways can you still get pleasure if you quit? Here are some questions to ask your health care provider:  How can you help me to be successful at quitting?  What medicine do you think would be best for me and how should I take it?  What should I do if I need more help?  What is smoking withdrawal like? How can I get information on withdrawal? GET READY  Set a quit date.  Change your environment by getting rid of all cigarettes, ashtrays, matches, and lighters in your home, car, or work. Do not let people smoke in your home.  Review your past attempts to quit. Think about what worked and what did not. GET SUPPORT AND ENCOURAGEMENT You have a better chance of being successful if you have help. You can get support in many ways.  Tell your family, friends, and coworkers that you are going to quit and need their support. Ask them not to smoke around you.  Get individual, group, or telephone counseling and support. Programs are available at local hospitals and health centers. Call   your local health department for information about programs in your area.  Spiritual beliefs and practices may help some smokers quit.  Download a "quit meter" on your computer to keep track of quit statistics, such as how long you have gone without smoking, cigarettes not smoked, and money saved.  Get a self-help book about quitting smoking and staying off tobacco. LEARN NEW SKILLS AND BEHAVIORS  Distract yourself from urges to smoke. Talk to someone, go for a walk, or occupy your time with a task.  Change your normal routine. Take a different route to work.  Drink tea instead of coffee. Eat breakfast in a different place.  Reduce your stress. Take a hot bath, exercise, or read a book.  Plan something enjoyable to do every day. Reward yourself for not smoking.  Explore interactive web-based programs that specialize in helping you quit. GET MEDICINE AND USE IT CORRECTLY Medicines can help you stop smoking and decrease the urge to smoke. Combining medicine with the above behavioral methods and support can greatly increase your chances of successfully quitting smoking.  Nicotine replacement therapy helps deliver nicotine to your body without the negative effects and risks of smoking. Nicotine replacement therapy includes nicotine gum, lozenges, inhalers, nasal sprays, and skin patches. Some may be available over-the-counter and others require a prescription.  Antidepressant medicine helps people abstain from smoking, but how this works is unknown. This medicine is available by prescription.  Nicotinic receptor partial agonist medicine simulates the effect of nicotine in your brain. This medicine is available by prescription. Ask your health care provider for advice about which medicines to use and how to use them based on your health history. Your health care provider will tell you what side effects to look out for if you choose to be on a medicine or therapy. Carefully read the information on the package. Do not use any other product containing nicotine while using a nicotine replacement product.  RELAPSE OR DIFFICULT SITUATIONS Most relapses occur within the first 3 months after quitting. Do not be discouraged if you start smoking again. Remember, most people try several times before finally quitting. You may have symptoms of withdrawal because your body is used to nicotine. You may crave cigarettes, be irritable, feel very hungry, cough often, get headaches, or have difficulty concentrating. The withdrawal symptoms are only temporary. They are strongest  when you first quit, but they will go away within 10-14 days. To reduce the chances of relapse, try to:  Avoid drinking alcohol. Drinking lowers your chances of successfully quitting.  Reduce the amount of caffeine you consume. Once you quit smoking, the amount of caffeine in your body increases and can give you symptoms, such as a rapid heartbeat, sweating, and anxiety.  Avoid smokers because they can make you want to smoke.  Do not let weight gain distract you. Many smokers will gain weight when they quit, usually less than 10 pounds. Eat a healthy diet and stay active. You can always lose the weight gained after you quit.  Find ways to improve your mood other than smoking. FOR MORE INFORMATION  www.smokefree.gov  Document Released: 02/07/2001 Document Revised: 06/30/2013 Document Reviewed: 05/25/2011 Exit[employer] Patient Information 2015 [employer]. This information is not intended to replace advice given to you by your health care provider. Make sure you discuss any questions you have with your health care provider.  

## 2014-01-16 LAB — VITAMIN D 25 HYDROXY (VIT D DEFICIENCY, FRACTURES): VIT D 25 HYDROXY: 22 ng/mL — AB (ref 30–100)

## 2014-01-20 ENCOUNTER — Other Ambulatory Visit: Payer: Self-pay | Admitting: Gynecology

## 2014-01-20 DIAGNOSIS — E559 Vitamin D deficiency, unspecified: Secondary | ICD-10-CM

## 2014-01-20 MED ORDER — VITAMIN D (ERGOCALCIFEROL) 1.25 MG (50000 UNIT) PO CAPS: 50000.0000 [IU] | ORAL_CAPSULE | ORAL | Status: DC

## 2014-01-21 ENCOUNTER — Other Ambulatory Visit: Payer: Self-pay | Admitting: Gynecology

## 2014-03-03 ENCOUNTER — Encounter: Payer: Self-pay | Admitting: Gynecology

## 2014-03-03 ENCOUNTER — Ambulatory Visit (INDEPENDENT_AMBULATORY_CARE_PROVIDER_SITE_OTHER): Payer: Medicare HMO

## 2014-03-03 DIAGNOSIS — M8588 Other specified disorders of bone density and structure, other site: Secondary | ICD-10-CM

## 2014-03-03 DIAGNOSIS — M8580 Other specified disorders of bone density and structure, unspecified site: Secondary | ICD-10-CM

## 2014-03-04 ENCOUNTER — Other Ambulatory Visit: Payer: Self-pay | Admitting: Gynecology

## 2014-03-04 DIAGNOSIS — M8580 Other specified disorders of bone density and structure, unspecified site: Secondary | ICD-10-CM

## 2014-04-14 ENCOUNTER — Other Ambulatory Visit: Payer: Self-pay | Admitting: Gynecology

## 2014-04-23 ENCOUNTER — Other Ambulatory Visit: Payer: Self-pay

## 2014-04-23 ENCOUNTER — Other Ambulatory Visit: Payer: Medicare Other

## 2014-04-23 ENCOUNTER — Other Ambulatory Visit: Payer: Self-pay | Admitting: Internal Medicine

## 2014-04-23 DIAGNOSIS — E049 Nontoxic goiter, unspecified: Secondary | ICD-10-CM

## 2014-04-23 DIAGNOSIS — Z0000 Encounter for general adult medical examination without abnormal findings: Secondary | ICD-10-CM

## 2014-04-23 DIAGNOSIS — E785 Hyperlipidemia, unspecified: Secondary | ICD-10-CM

## 2014-04-28 ENCOUNTER — Ambulatory Visit (INDEPENDENT_AMBULATORY_CARE_PROVIDER_SITE_OTHER): Payer: Medicare HMO | Admitting: Internal Medicine

## 2014-04-28 ENCOUNTER — Other Ambulatory Visit (INDEPENDENT_AMBULATORY_CARE_PROVIDER_SITE_OTHER): Payer: Medicare HMO

## 2014-04-28 ENCOUNTER — Encounter: Payer: Self-pay | Admitting: Internal Medicine

## 2014-04-28 VITALS — BP 124/82 | HR 73 | Temp 98.4°F | Resp 18 | Ht 63.0 in | Wt 116.0 lb

## 2014-04-28 DIAGNOSIS — Z0000 Encounter for general adult medical examination without abnormal findings: Secondary | ICD-10-CM

## 2014-04-28 DIAGNOSIS — Z23 Encounter for immunization: Secondary | ICD-10-CM | POA: Diagnosis not present

## 2014-04-28 DIAGNOSIS — E785 Hyperlipidemia, unspecified: Secondary | ICD-10-CM

## 2014-04-28 LAB — URINALYSIS, ROUTINE W REFLEX MICROSCOPIC
Bilirubin Urine: NEGATIVE
KETONES UR: NEGATIVE
Leukocytes, UA: NEGATIVE
Nitrite: NEGATIVE
Specific Gravity, Urine: <=1.005 — AB (ref 1.000–1.030)
Total Protein, Urine: NEGATIVE
URINE GLUCOSE: NEGATIVE
UROBILINOGEN UA: 0.2 (ref 0.0–1.0)
WBC, UA: NONE SEEN (ref 0–?)
pH: 6 (ref 5.0–8.0)

## 2014-04-28 LAB — HEPATIC FUNCTION PANEL
ALBUMIN: 4.3 g/dL (ref 3.5–5.2)
ALK PHOS: 70 U/L (ref 39–117)
ALT: 13 U/L (ref 0–35)
AST: 21 U/L (ref 0–37)
Bilirubin, Direct: 0.1 mg/dL (ref 0.0–0.3)
TOTAL PROTEIN: 7.2 g/dL (ref 6.0–8.3)
Total Bilirubin: 0.5 mg/dL (ref 0.2–1.2)

## 2014-04-28 LAB — CBC WITH DIFFERENTIAL/PLATELET
BASOS ABS: 0 10*3/uL (ref 0.0–0.1)
Basophils Relative: 0.4 % (ref 0.0–3.0)
EOS PCT: 1.8 % (ref 0.0–5.0)
Eosinophils Absolute: 0.1 10*3/uL (ref 0.0–0.7)
HEMATOCRIT: 39.8 % (ref 36.0–46.0)
Hemoglobin: 13.8 g/dL (ref 12.0–15.0)
LYMPHS ABS: 2.5 10*3/uL (ref 0.7–4.0)
LYMPHS PCT: 30.4 % (ref 12.0–46.0)
MCHC: 34.5 g/dL (ref 30.0–36.0)
MCV: 92.8 fl (ref 78.0–100.0)
MONOS PCT: 6.3 % (ref 3.0–12.0)
Monocytes Absolute: 0.5 10*3/uL (ref 0.1–1.0)
NEUTROS ABS: 5.1 10*3/uL (ref 1.4–7.7)
Neutrophils Relative %: 61.1 % (ref 43.0–77.0)
PLATELETS: 302 10*3/uL (ref 150.0–400.0)
RBC: 4.29 Mil/uL (ref 3.87–5.11)
RDW: 12.6 % (ref 11.5–15.5)
WBC: 8.3 10*3/uL (ref 4.0–10.5)

## 2014-04-28 LAB — BASIC METABOLIC PANEL
BUN: 9 mg/dL (ref 6–23)
CALCIUM: 9.6 mg/dL (ref 8.4–10.5)
CO2: 27 mEq/L (ref 19–32)
CREATININE: 0.66 mg/dL (ref 0.40–1.20)
Chloride: 103 mEq/L (ref 96–112)
GFR: 94.35 mL/min (ref >60.00)
GLUCOSE: 92 mg/dL (ref 70–99)
Potassium: 4.1 mEq/L (ref 3.5–5.1)
SODIUM: 135 meq/L (ref 135–145)

## 2014-04-28 LAB — LIPID PANEL
CHOL/HDL RATIO: 3
Cholesterol: 255 mg/dL — ABNORMAL HIGH (ref 0–200)
HDL: 88.3 mg/dL (ref >39.00)
NONHDL: 166.7
Triglycerides: 287 mg/dL — ABNORMAL HIGH (ref 0.0–149.0)
VLDL: 57.4 mg/dL — ABNORMAL HIGH (ref 0.0–40.0)

## 2014-04-28 LAB — LDL CHOLESTEROL, DIRECT: Direct LDL: 138 mg/dL

## 2014-04-28 LAB — TSH: TSH: 2.64 u[IU]/mL (ref 0.35–4.50)

## 2014-04-28 NOTE — Progress Notes (Signed)
Subjective:    Patient ID: Isabel Christensen, female    DOB: [birth year], 69 y.o.   MRN: [id number]  HPI  Here for wellness and f/u;  Overall doing ok;  Pt denies CP, worsening SOB, DOE, wheezing, orthopnea, PND, worsening LE edema, palpitations, dizziness or syncope.  Pt denies neurological change such as new headache, facial or extremity weakness.  Pt denies polydipsia, polyuria, or low sugar symptoms. Pt states overall good compliance with treatment and medications, good tolerability, and has been trying to follow lower cholesterol diet.  Pt denies worsening depressive symptoms, suicidal ideation or panic. No fever, night sweats, wt loss, loss of appetite, or other constitutional symptoms.  Pt states good ability with ADL's, has low fall risk, home safety reviewed and adequate, no other significant changes in hearing or vision, and only occasionally active with exercise no compalints Past Medical History  Diagnosis Date  . Hematuria     FOLLOWED BY UROLOGIST FOR THIS  . Brain aneurysm   . HYPERLIPIDEMIA 06/05/2007    Qualifier: Diagnosis of  By: Jenny Reichmann MD, Hunt Oris   . GERD 06/05/2007    Qualifier: Diagnosis of  By: Elveria Royals   . GOITER 03/03/2008    Qualifier: Diagnosis of  By: Jenny Reichmann MD, Hunt Oris   . DIVERTICULOSIS, COLON 06/05/2007    Qualifier: Diagnosis of  By: Jenny Reichmann MD, Hunt Oris   . Osteopenia   . Vitamin D deficiency    Past Surgical History  Procedure Laterality Date  . Nose surgery    . Vaginal hysterectomy  1976    FOR STERILIZATION  . Oophorectomy      reports that she has been smoking Cigarettes.  She has been smoking about 0.50 packs per day. She has never used smokeless tobacco. She reports that she drinks about 6.0 oz of alcohol per week. She reports that she does not use illicit drugs. family history includes Breast cancer in her mother and sister; Cancer in her sister and sister; Diabetes in her mother; Heart disease in her mother; Hypertension in her mother. Allergies    Allergen Reactions  . Aspirin     REACTION: brain aneurysm  . Black Cohosh     REACTION: hives  . Promethazine Hcl     REACTION: rash   Current Outpatient Prescriptions on File Prior to Visit  Medication Sig Dispense Refill  . Biotin 1 MG CAPS Take 1 capsule by mouth daily.    Marland Kitchen estradiol (VIVELLE-DOT) 0.0375 MG/24HR Place 1 patch onto the skin 2 (two) times a week. 8 patch 12  . lansoprazole (PREVACID) 15 MG capsule TAKE 1 CAPSULE EVERY DAY 90 capsule 3  . Misc Natural Products (LUTEIN 20) CAPS Take 1 tablet by mouth daily.    . Multiple Vitamin (MULTIVITAMIN) tablet Take 1 tablet by mouth daily.    . psyllium (METAMUCIL) 58.6 % packet Take 1 packet by mouth daily.     No current facility-administered medications on file prior to visit.   Review of Systems Constitutional: Negative for increased diaphoresis, other activity, appetite or other siginficant weight change  HENT: Negative for worsening hearing loss, ear pain, facial swelling, mouth sores and neck stiffness.   Eyes: Negative for other worsening pain, redness or visual disturbance.  Respiratory: Negative for shortness of breath and wheezing.   Cardiovascular: Negative for chest pain and palpitations.  Gastrointestinal: Negative for diarrhea, blood in stool, abdominal distention or other pain Genitourinary: Negative for hematuria, flank pain or change in urine volume.  Musculoskeletal: Negative for myalgias or other joint complaints.  Skin: Negative for color change and wound.  Neurological: Negative for syncope and numbness. other than noted Hematological: Negative for adenopathy. or other swelling Psychiatric/Behavioral: Negative for hallucinations, self-injury, decreased concentration or other worsening agitation.  \    Objective:   Physical Exam BP 124/82 mmHg  Pulse 73  Temp(Src) 98.4 F (36.9 C) (Oral)  Resp 18  Ht 5\' 3"  (1.6 m)  Wt 116 lb (52.617 kg)  BMI 20.55 kg/m2  SpO2 98% VS noted,  Constitutional:  Pt is oriented to person, place, and time. Appears well-developed and well-nourished.  Head: Normocephalic and atraumatic.  Right Ear: External ear normal.  Left Ear: External ear normal.  Nose: Nose normal.  Mouth/Throat: Oropharynx is clear and moist.  Eyes: Conjunctivae and EOM are normal. Pupils are equal, round, and reactive to light.  Neck: Normal range of motion. Neck supple. No JVD present. No tracheal deviation present.  Cardiovascular: Normal rate, regular rhythm, normal heart sounds and intact distal pulses.   Pulmonary/Chest: Effort normal and breath sounds without rales or wheezing  Abdominal: Soft. Bowel sounds are normal. NT. No HSM  Musculoskeletal: Normal range of motion. Exhibits no edema.  Lymphadenopathy:  Has no cervical adenopathy.  Neurological: Pt is alert and oriented to person, place, and time. Pt has normal reflexes. No cranial nerve deficit. Motor grossly intact Skin: Skin is warm and dry. No rash noted.  Psychiatric:  Has normal mood and affect. Behavior is normal.       Assessment & Plan:

## 2014-04-28 NOTE — Addendum Note (Signed)
Addended by: Valerie Salts on: 04/28/2014 01:57 PM   Modules accepted: Orders

## 2014-04-28 NOTE — Assessment & Plan Note (Signed)

## 2014-04-28 NOTE — Progress Notes (Signed)
Pre visit review using our clinic review tool, if applicable. No additional management support is needed unless otherwise documented below in the visit note. 

## 2014-04-28 NOTE — Patient Instructions (Addendum)
You had the new Prevnar pneumonia shot  Please continue all other medications as before, and refills have been done if requested.  Please have the pharmacy call with any other refills you may need.  Please continue your efforts at being more active, low cholesterol diet, and weight control.  You are otherwise up to date with prevention measures today.  Please keep your appointments with your specialists as you may have planned  Please go to the LAB in the Basement (turn left off the elevator) for the tests to be done today  You will be contacted by phone if any changes need to be made immediately.  Otherwise, you will receive a letter about your results with an explanation, but please check with MyChart first.  Please remember to sign up for MyChart if you have not done so, as this will be important to you in the future with finding out test results, communicating by private email, and scheduling acute appointments online when needed.  Please return in 1 year for your yearly visit, or sooner if needed, with Lab testing done 3-5 days before

## 2014-10-08 ENCOUNTER — Telehealth: Payer: Self-pay | Admitting: Geriatric Medicine

## 2014-10-08 NOTE — Telephone Encounter (Signed)
Left message asking patient to call me back to let me know if she has had a mammogram recently.  

## 2014-12-24 ENCOUNTER — Telehealth: Payer: Self-pay

## 2014-12-24 NOTE — Telephone Encounter (Signed)
Patient called to educate on Medicare Wellness apt. LVM for the patient to call back to educate and schedule for wellness visit. Left [phone] or practice number to schedule

## 2014-12-29 NOTE — Telephone Encounter (Signed)
2nd outreach for AWV; Asked to call and schedule or fup with health coach directly at [phone].

## 2015-01-20 ENCOUNTER — Encounter: Payer: Self-pay | Admitting: Gynecology

## 2015-01-20 ENCOUNTER — Ambulatory Visit (INDEPENDENT_AMBULATORY_CARE_PROVIDER_SITE_OTHER): Payer: Medicare HMO | Admitting: Gynecology

## 2015-01-20 VITALS — BP 108/68 | Ht 63.5 in | Wt 114.0 lb

## 2015-01-20 DIAGNOSIS — F17200 Nicotine dependence, unspecified, uncomplicated: Secondary | ICD-10-CM

## 2015-01-20 DIAGNOSIS — Z720 Tobacco use: Secondary | ICD-10-CM

## 2015-01-20 DIAGNOSIS — Z01419 Encounter for gynecological examination (general) (routine) without abnormal findings: Secondary | ICD-10-CM | POA: Diagnosis not present

## 2015-01-20 DIAGNOSIS — M8580 Other specified disorders of bone density and structure, unspecified site: Secondary | ICD-10-CM | POA: Diagnosis not present

## 2015-01-20 MED ORDER — ESTRADIOL 0.05 MG/24HR TD PTTW: 1.0000 | MEDICATED_PATCH | TRANSDERMAL | Status: DC

## 2015-01-20 NOTE — Patient Instructions (Signed)
Smoking Cessation, Tips for Success If you are ready to quit smoking, congratulations! You have chosen to help yourself be healthier. Cigarettes bring nicotine, tar, carbon monoxide, and other irritants into your body. Your lungs, heart, and blood vessels will be able to work better without these poisons. There are many different ways to quit smoking. Nicotine gum, nicotine patches, a nicotine inhaler, or nicotine nasal spray can help with physical craving. Hypnosis, support groups, and medicines help break the habit of smoking. WHAT THINGS CAN I DO TO MAKE QUITTING EASIER?  Here are some tips to help you quit for good:  Pick a date when you will quit smoking completely. Tell all of your friends and family about your plan to quit on that date.  Do not try to slowly cut down on the number of cigarettes you are smoking. Pick a quit date and quit smoking completely starting on that day.  Throw away all cigarettes.   Clean and remove all ashtrays from your home, work, and car.  On a card, write down your reasons for quitting. Carry the card with you and read it when you get the urge to smoke.  Cleanse your body of nicotine. Drink enough water and fluids to keep your urine clear or pale yellow. Do this after quitting to flush the nicotine from your body.  Learn to predict your moods. Do not let a bad situation be your excuse to have a cigarette. Some situations in your life might tempt you into wanting a cigarette.  Never have "just one" cigarette. It leads to wanting another and another. Remind yourself of your decision to quit.  Change habits associated with smoking. If you smoked while driving or when feeling stressed, try other activities to replace smoking. Stand up when drinking your coffee. Brush your teeth after eating. Sit in a different chair when you read the paper. Avoid alcohol while trying to quit, and try to drink fewer caffeinated beverages. Alcohol and caffeine may urge you to  smoke.  Avoid foods and drinks that can trigger a desire to smoke, such as sugary or spicy foods and alcohol.  Ask people who smoke not to smoke around you.  Have something planned to do right after eating or having a cup of coffee. For example, plan to take a walk or exercise.  Try a relaxation exercise to calm you down and decrease your stress. Remember, you may be tense and nervous for the first 2 weeks after you quit, but this will pass.  Find new activities to keep your hands busy. Play with a pen, coin, or rubber band. Doodle or draw things on paper.  Brush your teeth right after eating. This will help cut down on the craving for the taste of tobacco after meals. You can also try mouthwash.   Use oral substitutes in place of cigarettes. Try using lemon drops, carrots, cinnamon sticks, or chewing gum. Keep them handy so they are available when you have the urge to smoke.  When you have the urge to smoke, try deep breathing.  Designate your home as a nonsmoking area.  If you are a heavy smoker, ask your health care provider about a prescription for nicotine chewing gum. It can ease your withdrawal from nicotine.  Reward yourself. Set aside the cigarette money you save and buy yourself something nice.  Look for support from others. Join a support group or smoking cessation program. Ask someone at home or at work to help you with your plan   to quit smoking.  Always ask yourself, "Do I need this cigarette or is this just a reflex?" Tell yourself, "Today, I choose not to smoke," or "I do not want to smoke." You are reminding yourself of your decision to quit.  Do not replace cigarette smoking with electronic cigarettes (commonly called e-cigarettes). The safety of e-cigarettes is unknown, and some may contain harmful chemicals.  If you relapse, do not give up! Plan ahead and think about what you will do the next time you get the urge to smoke. HOW WILL I FEEL WHEN I QUIT SMOKING? You  may have symptoms of withdrawal because your body is used to nicotine (the addictive substance in cigarettes). You may crave cigarettes, be irritable, feel very hungry, cough often, get headaches, or have difficulty concentrating. The withdrawal symptoms are only temporary. They are strongest when you first quit but will go away within 10-14 days. When withdrawal symptoms occur, stay in control. Think about your reasons for quitting. Remind yourself that these are signs that your body is healing and getting used to being without cigarettes. Remember that withdrawal symptoms are easier to treat than the major diseases that smoking can cause.  Even after the withdrawal is over, expect periodic urges to smoke. However, these cravings are generally short lived and will go away whether you smoke or not. Do not smoke! WHAT RESOURCES ARE AVAILABLE TO HELP ME QUIT SMOKING? Your health care provider can direct you to community resources or hospitals for support, which may include:  Group support.  Education.  Hypnosis.  Therapy.   This information is not intended to replace advice given to you by your health care provider. Make sure you discuss any questions you have with your health care provider.   Document Released: 11/12/2003 Document Revised: 03/06/2014 Document Reviewed: 08/01/2012 Elsevier Interactive Patient Education 2016 [employer]. Osteoporosis Osteoporosis is the thinning and loss of density in the bones. Osteoporosis makes the bones more brittle, fragile, and likely to break (fracture). Over time, osteoporosis can cause the bones to become so weak that they fracture after a simple fall. The bones most likely to fracture are the bones in the hip, wrist, and spine. CAUSES  The exact cause is not known. RISK FACTORS Anyone can develop osteoporosis. You may be at greater risk if you have a family history of the condition or have poor nutrition. You may also have a higher risk if you are:    Female.   67 years old or older.  A smoker.  Not physically active.   White or Asian.  Slender. SIGNS AND SYMPTOMS  A fracture might be the first sign of the disease, especially if it results from a fall or injury that would not usually cause a bone to break. Other signs and symptoms include:   Low back and neck pain.  Stooped posture.  Height loss. DIAGNOSIS  To make a diagnosis, your health care provider may:  Take a medical history.  Perform a physical exam.  Order tests, such as:  A bone mineral density test.  A dual-energy X-ray absorptiometry test. TREATMENT  The goal of osteoporosis treatment is to strengthen your bones to reduce your risk of a fracture. Treatment may involve:  Making lifestyle changes, such as:  Eating a diet rich in calcium.  Doing weight-bearing and muscle-strengthening exercises.  Stopping tobacco use.  Limiting alcohol intake.  Taking medicine to slow the process of bone loss or to increase bone density.  Monitoring your levels of  calcium and vitamin D. HOME CARE INSTRUCTIONS  Include calcium and vitamin D in your diet. Calcium is important for bone health, and vitamin D helps the body absorb calcium.  Perform weight-bearing and muscle-strengthening exercises as directed by your health care provider.  Do not use any tobacco products, including cigarettes, chewing tobacco, and electronic cigarettes. If you need help quitting, ask your health care provider.  Limit your alcohol intake.  Take medicines only as directed by your health care provider.  Keep all follow-up visits as directed by your health care provider. This is important.  Take precautions at home to lower your risk of falling, such as:  Keeping rooms well lit and clutter free.  Installing safety rails on stairs.  Using rubber mats in the bathroom and other areas that are often wet or slippery. SEEK IMMEDIATE MEDICAL CARE IF:  You fall or injure yourself.     This information is not intended to replace advice given to you by your health care provider. Make sure you discuss any questions you have with your health care provider.   Document Released: 11/23/2004 Document Revised: 03/06/2014 Document Reviewed: 07/24/2013 Elsevier Interactive Patient Education 2016 [employer] Denosumab injection What is this medicine? DENOSUMAB (den oh sue mab) slows bone breakdown. Prolia is used to treat osteoporosis in women after menopause and in men. Delton See is used to prevent bone fractures and other bone problems caused by cancer bone metastases. Delton See is also used to treat giant cell tumor of the bone. This medicine may be used for other purposes; ask your health care provider or pharmacist if you have questions. What should I tell my health care provider before I take this medicine? They need to know if you have any of these conditions: -dental disease -eczema -infection or history of infections -kidney disease or on dialysis -low blood calcium or vitamin D -malabsorption syndrome -scheduled to have surgery or tooth extraction -taking medicine that contains denosumab -thyroid or parathyroid disease -an unusual reaction to denosumab, other medicines, foods, dyes, or preservatives -pregnant or trying to get pregnant -breast-feeding How should I use this medicine? This medicine is for injection under the skin. It is given by a health care professional in a hospital or clinic setting. If you are getting Prolia, a special MedGuide will be given to you by the pharmacist with each prescription and refill. Be sure to read this information carefully each time. For Prolia, talk to your pediatrician regarding the use of this medicine in children. Special care may be needed. For Delton See, talk to your pediatrician regarding the use of this medicine in children. While this drug may be prescribed for children as young as 13 years for selected conditions, precautions do  apply. Overdosage: If you think you have taken too much of this medicine contact a poison control center or emergency room at once. NOTE: This medicine is only for you. Do not share this medicine with others. What if I miss a dose? It is important not to miss your dose. Call your doctor or health care professional if you are unable to keep an appointment. What may interact with this medicine? Do not take this medicine with any of the following medications: -other medicines containing denosumab This medicine may also interact with the following medications: -medicines that suppress the immune system -medicines that treat cancer -steroid medicines like prednisone or cortisone This list may not describe all possible interactions. Give your health care provider a list of all the medicines, herbs, non-prescription drugs,  or dietary supplements you use. Also tell them if you smoke, drink alcohol, or use illegal drugs. Some items may interact with your medicine. What should I watch for while using this medicine? Visit your doctor or health care professional for regular checks on your progress. Your doctor or health care professional may order blood tests and other tests to see how you are doing. Call your doctor or health care professional if you get a cold or other infection while receiving this medicine. Do not treat yourself. This medicine may decrease your body's ability to fight infection. You should make sure you get enough calcium and vitamin D while you are taking this medicine, unless your doctor tells you not to. Discuss the foods you eat and the vitamins you take with your health care professional. See your dentist regularly. Brush and floss your teeth as directed. Before you have any dental work done, tell your dentist you are receiving this medicine. Do not become pregnant while taking this medicine or for 5 months after stopping it. Women should inform their doctor if they wish to become  pregnant or think they might be pregnant. There is a potential for serious side effects to an unborn child. Talk to your health care professional or pharmacist for more information. What side effects may I notice from receiving this medicine? Side effects that you should report to your doctor or health care professional as soon as possible: -allergic reactions like skin rash, itching or hives, swelling of the face, lips, or tongue -breathing problems -chest pain -fast, irregular heartbeat -feeling faint or lightheaded, falls -fever, chills, or any other sign of infection -muscle spasms, tightening, or twitches -numbness or tingling -skin blisters or bumps, or is dry, peels, or red -slow healing or unexplained pain in the mouth or jaw -unusual bleeding or bruising Side effects that usually do not require medical attention (Report these to your doctor or health care professional if they continue or are bothersome.): -muscle pain -stomach upset, gas This list may not describe all possible side effects. Call your doctor for medical advice about side effects. You may report side effects to FDA at 1-800-FDA-1088. Where should I keep my medicine? This medicine is only given in a clinic, doctor's office, or other health care setting and will not be stored at home. NOTE: This sheet is a summary. It may not cover all possible information. If you have questions about this medicine, talk to your doctor, pharmacist, or health care provider.    2016, Elsevier/Gold Standard. (2011-08-14 12:37:47)

## 2015-01-20 NOTE — Progress Notes (Signed)
Isabel Christensen Sep 08, 1945 [id number]   History:    69 y.o.  for annual gyn exam who was seen last year in November 2015 for her annual gynecological examination and has been unable to come off the Vivelle-Dot 0.05 mg transdermally twice a week for vasomotor symptoms. Patient does have past history of vaginal hysterectomy back in 1976 for benign indications. Patient had a bone density study early this year which demonstrated the following:  Patient's lowest T score was at the right femoral neck with a value of -1.4 but with a Frax analysis indicating a 6.7% risk of fracture of her hip above threshold cut off range.  Patient smokes half a pack cigarette per day and consumes 2-3 alcoholic beverages a day. Patient also has a sister with osteoporosis. Patient with low body mass index increasing her risk of osteoporosis and fractures even more. She states that she is taking her calcium and vitamin D.She had a colonoscopy in [id number] she was not certain whether she had a small benign polyp but she was informed that she was on a 10 year recall. Patient denies any prior history of abnormal Pap smears. She did bring up to my attention in both her mother and sister both had breast cancer at the age of 94. No family history of ovarian or colorectal cancer reported. She also has had history in the past of microscopic hematuria and had been followed by the urologist. She states that this issue has been addressed and corrected and she is no longer had any episodes of hematuria. Her PCP is Dr. Cathlean Cower who is been doing her blood work. She states her vaccines are up-to-date.  Past medical history,surgical history, family history and social history were all reviewed and documented in the EPIC chart.  Gynecologic History No LMP recorded. Patient has had a hysterectomy. Contraception: status post hysterectomy Last Pap: 2012. Results were: normal Last mammogram: 2014. Results were: normal  Obstetric History OB History   Gravida Para Term Preterm AB SAB TAB Ectopic Multiple Living  1    1     0    # Outcome Date GA Lbr Len/2nd Weight Sex Delivery Anes PTL Lv  1 AB                ROS: A ROS was performed and pertinent positives and negatives are included in the history.  GENERAL: No fevers or chills. HEENT: No change in vision, no earache, sore throat or sinus congestion. NECK: No pain or stiffness. CARDIOVASCULAR: No chest pain or pressure. No palpitations. PULMONARY: No shortness of breath, cough or wheeze. GASTROINTESTINAL: No abdominal pain, nausea, vomiting or diarrhea, melena or bright red blood per rectum. GENITOURINARY: No urinary frequency, urgency, hesitancy or dysuria. MUSCULOSKELETAL: No joint or muscle pain, no back pain, no recent trauma. DERMATOLOGIC: No rash, no itching, no lesions. ENDOCRINE: No polyuria, polydipsia, no heat or cold intolerance. No recent change in weight. HEMATOLOGICAL: No anemia or easy bruising or bleeding. NEUROLOGIC: No headache, seizures, numbness, tingling or weakness. PSYCHIATRIC: No depression, no loss of interest in normal activity or change in sleep pattern.     Exam: chaperone present  BP 108/68 mmHg  Ht 5' 3.5" (1.613 m)  Wt 114 lb (51.71 kg)  BMI 19.87 kg/m2  Body mass index is 19.87 kg/(m^2).  General appearance : Well developed well nourished female. No acute distress HEENT: Eyes: no retinal hemorrhage or exudates,  Neck supple, trachea midline, no carotid bruits, no thyroidmegaly Lungs: Clear to  auscultation, no rhonchi or wheezes, or rib retractions  Heart: Regular rate and rhythm, no murmurs or gallops Breast:Examined in sitting and supine position were symmetrical in appearance, no palpable masses or tenderness,  no skin retraction, no nipple inversion, no nipple discharge, no skin discoloration, no axillary or supraclavicular lymphadenopathy Abdomen: no palpable masses or tenderness, no rebound or guarding Extremities: no edema or skin discoloration  or tenderness  Pelvic:  Bartholin, Urethra, Skene Glands: Within normal limits             Vagina: No gross lesions or discharge  Cervix: Absent  Uterus  absent  Adnexa  Without masses or tenderness  Anus and perineum  normal   Rectovaginal  normal sphincter tone without palpated masses or tenderness             Hemoccult PCP provides     Assessment/Plan:  69 y.o. female for annual exam with osteopenia but high-risk for fracture based on the following: Thin with low body mass index her weight 114 pounds Chronic smoker Consumption of 3 alcoholic beverages per day Family history of osteoporosis Frax analysis exceeding threshold cut off. Increase fracture risk of 6.7% in the next 10 years  Patient with history of esophageal reflux we'll not be a candidate for oral bisphosphonate. I'm going to recommend subcutaneous Prolia 60 mg subcutaneous every 6 months. We are going to check her calcium, vitamin D and PTH level today.  Pap smear not done today. Patient has a scheduled mammogram for next week. We'll repeat her bone density study one year after initiating therapy to monitor response to treatment. She was counseled once again on the detrimental effects of smoking. She was reminded to take her calcium vitamin D and engage in weightbearing exercises 3-4 times a week.   Terrance Mass MD, 10:59 AM 01/20/2015

## 2015-01-21 LAB — VITAMIN D 25 HYDROXY (VIT D DEFICIENCY, FRACTURES): Vit D, 25-Hydroxy: 19 ng/mL — ABNORMAL LOW (ref 30–100)

## 2015-01-25 ENCOUNTER — Telehealth: Payer: Self-pay | Admitting: Gynecology

## 2015-01-25 LAB — PTH, INTACT AND CALCIUM
Calcium: 9.4 mg/dL (ref 8.4–10.5)
PTH: 22 pg/mL (ref 14–64)

## 2015-01-25 NOTE — Telephone Encounter (Signed)
Isabel Christensen, this patient has osteopenia BUT Frax analysis puts her at high risk for fracture 6.7% and she is also a smoker and high risk for fracture (sister with osteoporosis). Patient also drinks 2-3 drinks per day and has history of GERD and cannot take oral bis. Check for Prolia coverage. Note from Dr Toney Rakes.

## 2015-01-28 NOTE — Telephone Encounter (Signed)
PC to pt left VM, will call her back regarding insurance coverage when received. Calcium level 01/20/15  9.4.

## 2015-02-02 ENCOUNTER — Other Ambulatory Visit: Payer: Self-pay | Admitting: Gynecology

## 2015-02-02 DIAGNOSIS — E559 Vitamin D deficiency, unspecified: Secondary | ICD-10-CM

## 2015-02-08 DIAGNOSIS — R69 Illness, unspecified: Secondary | ICD-10-CM | POA: Diagnosis not present

## 2015-03-02 NOTE — Telephone Encounter (Signed)
Received information from [employer] regarding Prolia. Prolia is not covered as a medical benefit at this time. I have left a VM for patient to return the phone call. I will forward this information to Dr Toney Rakes to advise regarding non coverage.

## 2015-03-03 MED ORDER — RISEDRONATE SODIUM 150 MG PO TABS: 150.0000 mg | ORAL_TABLET | ORAL | Status: DC

## 2015-03-03 NOTE — Telephone Encounter (Signed)
Check to see if they will cover for Actonel 150 mg every monthly since she suffers from GERD she cannot be taking Fosamax weekly

## 2015-03-03 NOTE — Telephone Encounter (Signed)
Rx sent to pharmacy, The Rx must be sent to pharmacy first so they can run it though with her insurance to determine if coverage.

## 2015-03-09 DIAGNOSIS — D225 Melanocytic nevi of trunk: Secondary | ICD-10-CM | POA: Diagnosis not present

## 2015-03-09 DIAGNOSIS — L821 Other seborrheic keratosis: Secondary | ICD-10-CM | POA: Diagnosis not present

## 2015-03-09 DIAGNOSIS — D1801 Hemangioma of skin and subcutaneous tissue: Secondary | ICD-10-CM | POA: Diagnosis not present

## 2015-03-09 DIAGNOSIS — L720 Epidermal cyst: Secondary | ICD-10-CM | POA: Diagnosis not present

## 2015-03-09 DIAGNOSIS — L820 Inflamed seborrheic keratosis: Secondary | ICD-10-CM | POA: Diagnosis not present

## 2015-03-09 DIAGNOSIS — Z85828 Personal history of other malignant neoplasm of skin: Secondary | ICD-10-CM | POA: Diagnosis not present

## 2015-03-09 DIAGNOSIS — D485 Neoplasm of uncertain behavior of skin: Secondary | ICD-10-CM | POA: Diagnosis not present

## 2015-03-09 NOTE — Telephone Encounter (Signed)
Insurance checked thru PC to [employer] , No deductible, Co ins 80% ins/ 20% pt.  approx $420, OPM $4950 (85met), PA needed. Approx cost to pt $420.00 per injection.

## 2015-03-11 NOTE — Telephone Encounter (Signed)
Patient called with numerous questions  Regarding Prolia and Actonel. She is concerned about her teeth , jaw issues and side effects. I suggested a consult appointment with Dr Toney Rakes, a call to her dentist regarding her teeth, and reviewing the information on the wed page for each medication. PA for insurance has been sent for approval in the event she decides on Prolia.. I will wait on patients decision and inform Dr Toney Rakes of her concerns.

## 2015-03-11 NOTE — Telephone Encounter (Signed)
Needs consult

## 2015-03-18 DIAGNOSIS — Z1231 Encounter for screening mammogram for malignant neoplasm of breast: Secondary | ICD-10-CM | POA: Diagnosis not present

## 2015-03-23 NOTE — Telephone Encounter (Addendum)
PC to pt . PA approval ref# [id number] 0000 03/09/2015 thru 03/08/2016, approval for two injections one every 6 months. No change with benefits co pay 20% approx $420.00 with co pay for administration $45 with OV.   OOM mas $4950 ($45 met). Patient waiting to hear from dental surgeon prior to consult. She will schedule consult with Dr Toney Rakes.  M85.88  M85.9

## 2015-03-24 ENCOUNTER — Encounter: Payer: Self-pay | Admitting: Gynecology

## 2015-03-29 DIAGNOSIS — Z0100 Encounter for examination of eyes and vision without abnormal findings: Secondary | ICD-10-CM | POA: Diagnosis not present

## 2015-03-29 DIAGNOSIS — H5203 Hypermetropia, bilateral: Secondary | ICD-10-CM | POA: Diagnosis not present

## 2015-04-01 ENCOUNTER — Ambulatory Visit (INDEPENDENT_AMBULATORY_CARE_PROVIDER_SITE_OTHER): Payer: Medicare HMO | Admitting: Gynecology

## 2015-04-01 ENCOUNTER — Encounter: Payer: Self-pay | Admitting: Gynecology

## 2015-04-01 VITALS — BP 110/78 | Ht 63.0 in | Wt 114.0 lb

## 2015-04-01 DIAGNOSIS — Z8639 Personal history of other endocrine, nutritional and metabolic disease: Secondary | ICD-10-CM | POA: Insufficient documentation

## 2015-04-01 DIAGNOSIS — M8580 Other specified disorders of bone density and structure, unspecified site: Secondary | ICD-10-CM

## 2015-04-01 NOTE — Telephone Encounter (Signed)
Navi seen by Dr Toney Rakes today. She will hold off Prolia due to dental surgery this year per Dr Toney Rakes note. M89.9 senile osteopenia. Hx GERD

## 2015-04-01 NOTE — Progress Notes (Signed)
   Patient presented to the office today to once again discuss her recent bone density study and recommendations for treatment. She has informed me that she's in the process of having several teeth removed as well as implants placed this year. Review of her record indicated that her recent bone density study this year had demonstrated the following:  Patient's lowest T score was at the right femoral neck with a value of -1.4 but with a Frax analysis indicating a 6.7% risk of fracture of her hip above threshold cut off range.  Patient smokes half a pack cigarette per day and consumes 2-3 alcoholic beverages a day. Patient also has a sister with osteoporosis. Patient with low body mass index increasing her risk of osteoporosis and fractures even more. She states that she is taking her calcium and vitamin D. Patient's mother also had history of osteoporosis. I have recommended that she wait until after she completes her dental work this year and then perhaps worsening of the year early part of next year then begin the Prolia since she suffers from gastroesophageal reflux she cannot take oral bisphosphonate.  Patient vitamin D level recently was found to be 19. She is currently taking vitamin D 50,000 units every weekly for 3 months. She will then return to the office to check her vitamin D level after the three-month treatment plan. She will then begin taking vitamin D3 2000 units daily. She'll continue also on her calcium as well as weightbearing exercises.  Greater than 50% of time was spent in counseling coordinate and care of this patient with osteopenia and at risk for fracture.

## 2015-04-02 ENCOUNTER — Encounter: Payer: Self-pay | Admitting: Gynecology

## 2015-04-05 ENCOUNTER — Encounter: Payer: Self-pay | Admitting: Internal Medicine

## 2015-04-28 ENCOUNTER — Encounter: Payer: Medicare HMO | Admitting: Internal Medicine

## 2015-05-11 ENCOUNTER — Ambulatory Visit (INDEPENDENT_AMBULATORY_CARE_PROVIDER_SITE_OTHER): Payer: Medicare HMO | Admitting: Internal Medicine

## 2015-05-11 ENCOUNTER — Encounter: Payer: Self-pay | Admitting: Internal Medicine

## 2015-05-11 VITALS — BP 122/72 | HR 78 | Temp 98.2°F | Resp 20 | Wt 119.0 lb

## 2015-05-11 DIAGNOSIS — Z0000 Encounter for general adult medical examination without abnormal findings: Secondary | ICD-10-CM | POA: Diagnosis not present

## 2015-05-11 DIAGNOSIS — M8580 Other specified disorders of bone density and structure, unspecified site: Secondary | ICD-10-CM | POA: Diagnosis not present

## 2015-05-11 DIAGNOSIS — E785 Hyperlipidemia, unspecified: Secondary | ICD-10-CM | POA: Diagnosis not present

## 2015-05-11 NOTE — Assessment & Plan Note (Signed)

## 2015-05-11 NOTE — Progress Notes (Signed)
Subjective:    Patient ID: Isabel Christensen, female    DOB: 1945/06/12, 70 y.o.   MRN: [id number]  HPI  Here for wellness and f/u;  Overall doing ok;  Pt denies Chest pain, worsening SOB, DOE, wheezing, orthopnea, PND, worsening LE edema, palpitations, dizziness or syncope.  Pt denies neurological change such as new headache, facial or extremity weakness.  Pt denies polydipsia, polyuria, or low sugar symptoms. Pt states overall good compliance with treatment and medications, good tolerability, and has been trying to follow appropriate diet.  Pt denies worsening depressive symptoms, suicidal ideation or panic. No fever, night sweats, wt loss, loss of appetite, or other constitutional symptoms.  Pt states good ability with ADL's, has low fall risk, home safety reviewed and adequate, no other significant changes in hearing or vision, and only occasionally active with exercise. GYN rx her to start prolia.   Past Medical History  Diagnosis Date  . Hematuria     FOLLOWED BY UROLOGIST FOR THIS  . Brain aneurysm   . HYPERLIPIDEMIA 06/05/2007    Qualifier: Diagnosis of  By: Jenny Reichmann MD, Hunt Oris   . GERD 06/05/2007    Qualifier: Diagnosis of  By: Elveria Royals   . GOITER 03/03/2008    Qualifier: Diagnosis of  By: Jenny Reichmann MD, Hunt Oris   . DIVERTICULOSIS, COLON 06/05/2007    Qualifier: Diagnosis of  By: Jenny Reichmann MD, Hunt Oris   . Osteopenia   . Vitamin D deficiency    Past Surgical History  Procedure Laterality Date  . Nose surgery    . Vaginal hysterectomy  1976    FOR STERILIZATION  . Oophorectomy      reports that she has been smoking Cigarettes.  She has been smoking about 0.50 packs per day. She has never used smokeless tobacco. She reports that she drinks about 6.0 oz of alcohol per week. She reports that she does not use illicit drugs. family history includes Breast cancer in her mother and sister; Cancer in her sister and sister; Diabetes in her mother; Heart disease in her mother; Hypertension in her  mother. Allergies  Allergen Reactions  . Aspirin     REACTION: brain aneurysm  . Black Cohosh     REACTION: hives  . Promethazine Hcl     REACTION: rash   Current Outpatient Prescriptions on File Prior to Visit  Medication Sig Dispense Refill  . Biotin 1 MG CAPS Take 1 capsule by mouth daily.    . cycloSPORINE (RESTASIS) 0.05 % ophthalmic emulsion 1 drop 2 (two) times daily.    Marland Kitchen estradiol (VIVELLE-DOT) 0.05 MG/24HR patch Place 1 patch (0.05 mg total) onto the skin 2 (two) times a week. 8 patch 12  . lansoprazole (PREVACID) 15 MG capsule TAKE 1 CAPSULE EVERY DAY 90 capsule 3  . Misc Natural Products (LUTEIN 20) CAPS Take 1 tablet by mouth daily.    . Multiple Vitamin (MULTIVITAMIN) tablet Take 1 tablet by mouth daily.    . psyllium (METAMUCIL) 58.6 % packet Take 1 packet by mouth daily.    . risedronate (ACTONEL) 150 MG tablet Take 1 tablet (150 mg total) by mouth every 30 (thirty) days. with water on empty stomach, nothing by mouth or lie down for next 30 minutes. 1 tablet 11   No current facility-administered medications on file prior to visit.   Review of Systems Constitutional: Negative for increased diaphoresis, other activity, appetite or siginficant weight change other than noted HENT: Negative for worsening hearing loss, ear  pain, facial swelling, mouth sores and neck stiffness.   Eyes: Negative for other worsening pain, redness or visual disturbance.  Respiratory: Negative for shortness of breath and wheezing  Cardiovascular: Negative for chest pain and palpitations.  Gastrointestinal: Negative for diarrhea, blood in stool, abdominal distention or other pain Genitourinary: Negative for hematuria, flank pain or change in urine volume.  Musculoskeletal: Negative for myalgias or other joint complaints.  Skin: Negative for color change and wound or drainage.  Neurological: Negative for syncope and numbness. other than noted Hematological: Negative for adenopathy. or other  swelling Psychiatric/Behavioral: Negative for hallucinations, SI, self-injury, decreased concentration or other worsening agitation.      Objective:   Physical Exam BP 122/72 mmHg  Pulse 78  Temp(Src) 98.2 F (36.8 C) (Oral)  Resp 20  Wt 119 lb (53.978 kg)  SpO2 98% VS noted,  Constitutional: Pt is oriented to person, place, and time. Appears well-developed and well-nourished, in no significant distress Head: Normocephalic and atraumatic.  Right Ear: External ear normal.  Left Ear: External ear normal.  Nose: Nose normal.  Mouth/Throat: Oropharynx is clear and moist.  Eyes: Conjunctivae and EOM are normal. Pupils are equal, round, and reactive to light.  Neck: Normal range of motion. Neck supple. No JVD present. No tracheal deviation present or significant neck LA or mass Cardiovascular: Normal rate, regular rhythm, normal heart sounds and intact distal pulses.   Pulmonary/Chest: Effort normal and breath sounds without rales or wheezing  Abdominal: Soft. Bowel sounds are normal. NT. No HSM  Musculoskeletal: Normal range of motion. Exhibits no edema.  Lymphadenopathy:  Has no cervical adenopathy.  Neurological: Pt is alert and oriented to person, place, and time. Pt has normal reflexes. No cranial nerve deficit. Motor grossly intact Skin: Skin is warm and dry. No rash noted.  Psychiatric:  Has normal mood and affect. Behavior is normal.       Assessment & Plan:

## 2015-05-11 NOTE — Progress Notes (Signed)
Pre visit review using our clinic review tool, if applicable. No additional management support is needed unless otherwise documented below in the visit note. 

## 2015-05-11 NOTE — Patient Instructions (Addendum)
You had the pneumovax shot today  Please continue all other medications as before, and refills have been done if requested.  Please have the pharmacy call with any other refills you may need.  Please continue your efforts at being more active, low cholesterol diet, and weight control.  You are otherwise up to date with prevention measures today.  Please keep your appointments with your specialists as you may have planned  Please go to the LAB in the Basement (turn left off the elevator) for the tests to be done at your convenience  You will be contacted by phone if any changes need to be made immediately.  Otherwise, you will receive a letter about your results with an explanation, but please check with MyChart first.  Please remember to sign up for MyChart if you have not done so, as this will be important to you in the future with finding out test results, communicating by private email, and scheduling acute appointments online when needed.  Please return in 1 year for your yearly visit, or sooner if needed, with Lab testing done 3-5 days before

## 2015-05-12 ENCOUNTER — Encounter: Payer: Self-pay | Admitting: Internal Medicine

## 2015-05-12 ENCOUNTER — Other Ambulatory Visit: Payer: Self-pay | Admitting: Internal Medicine

## 2015-05-12 ENCOUNTER — Other Ambulatory Visit (INDEPENDENT_AMBULATORY_CARE_PROVIDER_SITE_OTHER): Payer: Medicare HMO

## 2015-05-12 DIAGNOSIS — Z0000 Encounter for general adult medical examination without abnormal findings: Secondary | ICD-10-CM | POA: Diagnosis not present

## 2015-05-12 DIAGNOSIS — R7989 Other specified abnormal findings of blood chemistry: Secondary | ICD-10-CM

## 2015-05-12 LAB — BASIC METABOLIC PANEL
BUN: 8 mg/dL (ref 6–23)
CHLORIDE: 100 meq/L (ref 96–112)
CO2: 26 mEq/L (ref 19–32)
CREATININE: 0.63 mg/dL (ref 0.40–1.20)
Calcium: 9.4 mg/dL (ref 8.4–10.5)
GFR: 99.26 mL/min (ref >60.00)
Glucose, Bld: 104 mg/dL — ABNORMAL HIGH (ref 70–99)
POTASSIUM: 4 meq/L (ref 3.5–5.1)
SODIUM: 135 meq/L (ref 135–145)

## 2015-05-12 LAB — CBC WITH DIFFERENTIAL/PLATELET
Basophils Absolute: 0.1 10*3/uL (ref 0.0–0.1)
Basophils Relative: 0.7 % (ref 0.0–3.0)
EOS ABS: 0.3 10*3/uL (ref 0.0–0.7)
Eosinophils Relative: 2.7 % (ref 0.0–5.0)
HCT: 44.5 % (ref 36.0–46.0)
HEMOGLOBIN: 15.2 g/dL — AB (ref 12.0–15.0)
Lymphocytes Relative: 24.6 % (ref 12.0–46.0)
Lymphs Abs: 2.8 10*3/uL (ref 0.7–4.0)
MCHC: 34.1 g/dL (ref 30.0–36.0)
MCV: 94.7 fl (ref 78.0–100.0)
MONO ABS: 0.9 10*3/uL (ref 0.1–1.0)
Monocytes Relative: 8.1 % (ref 3.0–12.0)
NEUTROS PCT: 63.9 % (ref 43.0–77.0)
Neutro Abs: 7.3 10*3/uL (ref 1.4–7.7)
Platelets: 318 10*3/uL (ref 150.0–400.0)
RBC: 4.7 Mil/uL (ref 3.87–5.11)
RDW: 12.7 % (ref 11.5–15.5)
WBC: 11.4 10*3/uL — AB (ref 4.0–10.5)

## 2015-05-12 LAB — LIPID PANEL
CHOL/HDL RATIO: 3
Cholesterol: 266 mg/dL — ABNORMAL HIGH (ref 0–200)
HDL: 83.2 mg/dL (ref >39.00)
NONHDL: 182.36
TRIGLYCERIDES: 258 mg/dL — AB (ref 0.0–149.0)
VLDL: 51.6 mg/dL — ABNORMAL HIGH (ref 0.0–40.0)

## 2015-05-12 LAB — TSH: TSH: 3.3 u[IU]/mL (ref 0.35–4.50)

## 2015-05-12 LAB — URINALYSIS, ROUTINE W REFLEX MICROSCOPIC
Bilirubin Urine: NEGATIVE
Ketones, ur: NEGATIVE
LEUKOCYTES UA: NEGATIVE
NITRITE: NEGATIVE
PH: 6.5 (ref 5.0–8.0)
Specific Gravity, Urine: 1.01 (ref 1.000–1.030)
TOTAL PROTEIN, URINE-UPE24: NEGATIVE
URINE GLUCOSE: NEGATIVE
Urobilinogen, UA: 0.2 (ref 0.0–1.0)

## 2015-05-12 LAB — HEPATIC FUNCTION PANEL
ALK PHOS: 77 U/L (ref 39–117)
ALT: 13 U/L (ref 0–35)
AST: 20 U/L (ref 0–37)
Albumin: 4.3 g/dL (ref 3.5–5.2)
BILIRUBIN DIRECT: 0.1 mg/dL (ref 0.0–0.3)
BILIRUBIN TOTAL: 0.7 mg/dL (ref 0.2–1.2)
Total Protein: 7.2 g/dL (ref 6.0–8.3)

## 2015-05-12 LAB — LDL CHOLESTEROL, DIRECT: LDL DIRECT: 136 mg/dL

## 2015-05-12 MED ORDER — ROSUVASTATIN CALCIUM 10 MG PO TABS: 10.0000 mg | ORAL_TABLET | Freq: Every day | ORAL | Status: DC

## 2015-05-12 NOTE — Assessment & Plan Note (Signed)
stable overall by history and exam, recent data reviewed with pt, and pt to continue medical treatment as before,  to f/u any worsening symptoms or concerns Lab Results  Component Value Date   CHOL 255* 04/28/2014   HDL 88.30 04/28/2014   LDLDIRECT 138.0 04/28/2014   TRIG 287.0* 04/28/2014   CHOLHDL 3 04/28/2014

## 2015-05-12 NOTE — Assessment & Plan Note (Signed)
Encourage pt to start prolia as rx

## 2015-07-01 ENCOUNTER — Telehealth: Payer: Self-pay | Admitting: *Deleted

## 2015-07-01 NOTE — Telephone Encounter (Signed)
Medication approved effective 02/26/15-02/26/16 referral [id number]

## 2015-07-01 NOTE — Telephone Encounter (Signed)
Prior Authorization for estradiol patch (vivelle-dot )  0.05 mg done via cover my meds, will wait for response.

## 2015-08-02 DIAGNOSIS — R69 Illness, unspecified: Secondary | ICD-10-CM | POA: Diagnosis not present

## 2015-09-07 DIAGNOSIS — L821 Other seborrheic keratosis: Secondary | ICD-10-CM | POA: Diagnosis not present

## 2015-09-07 DIAGNOSIS — L218 Other seborrheic dermatitis: Secondary | ICD-10-CM | POA: Diagnosis not present

## 2015-09-07 DIAGNOSIS — D224 Melanocytic nevi of scalp and neck: Secondary | ICD-10-CM | POA: Diagnosis not present

## 2015-09-07 DIAGNOSIS — Z85828 Personal history of other malignant neoplasm of skin: Secondary | ICD-10-CM | POA: Diagnosis not present

## 2015-12-27 DIAGNOSIS — J0100 Acute maxillary sinusitis, unspecified: Secondary | ICD-10-CM | POA: Diagnosis not present

## 2016-01-03 ENCOUNTER — Ambulatory Visit (INDEPENDENT_AMBULATORY_CARE_PROVIDER_SITE_OTHER): Payer: Medicare HMO | Admitting: Internal Medicine

## 2016-01-03 DIAGNOSIS — J069 Acute upper respiratory infection, unspecified: Secondary | ICD-10-CM | POA: Diagnosis not present

## 2016-01-03 MED ORDER — AZITHROMYCIN 250 MG PO TABS: ORAL_TABLET | ORAL | 1 refills | Status: DC

## 2016-01-03 NOTE — Patient Instructions (Signed)
Please take all new medication as prescribed - the antibiotic  You can also take Mucinex (or it's generic off brand) for congestion, and tylenol as needed for pain.  Please stop smoking  Please continue all other medications as before, and refills have been done if requested.  Please have the pharmacy call with any other refills you may need.  Please keep your appointments with your specialists as you may have planned

## 2016-01-03 NOTE — Progress Notes (Signed)
Pre visit review using our clinic review tool, if applicable. No additional management support is needed unless otherwise documented below in the visit note. 

## 2016-01-10 NOTE — Progress Notes (Signed)
Subjective:    Patient ID: Isabel Christensen, female    DOB: 12/24/1945, 70 y.o.   MRN: [id number]  HPI   Here with 2-3 days acute onset fever, facial pain, pressure, headache, general weakness and malaise, and greenish d/c, with mild ST and cough, but pt denies chest pain, wheezing, increased sob or doe, orthopnea, PND, increased LE swelling, palpitations, dizziness or syncope. Past Medical History:  Diagnosis Date  . Brain aneurysm   . DIVERTICULOSIS, COLON 06/05/2007   Qualifier: Diagnosis of  By: Jenny Reichmann MD, Hunt Oris   . GERD 06/05/2007   Qualifier: Diagnosis of  By: Elveria Royals   . GOITER 03/03/2008   Qualifier: Diagnosis of  By: Jenny Reichmann MD, Hunt Oris   . Hematuria    FOLLOWED BY UROLOGIST FOR THIS  . HYPERLIPIDEMIA 06/05/2007   Qualifier: Diagnosis of  By: Jenny Reichmann MD, Hunt Oris   . Osteopenia   . Vitamin D deficiency    Past Surgical History:  Procedure Laterality Date  . NOSE SURGERY    . OOPHORECTOMY    . VAGINAL HYSTERECTOMY  1976   FOR STERILIZATION    reports that she has been smoking Cigarettes.  She has been smoking about 0.50 packs per day. She has never used smokeless tobacco. She reports that she drinks about 6.0 oz of alcohol per week . She reports that she does not use drugs. family history includes Breast cancer in her mother and sister; Cancer in her sister and sister; Diabetes in her mother; Heart disease in her mother; Hypertension in her mother. Allergies  Allergen Reactions  . Aspirin     REACTION: brain aneurysm  . Black Cohosh     REACTION: hives  . Promethazine Hcl     REACTION: rash   Current Outpatient Prescriptions on File Prior to Visit  Medication Sig Dispense Refill  . Biotin 1 MG CAPS Take 1 capsule by mouth daily.    . cycloSPORINE (RESTASIS) 0.05 % ophthalmic emulsion 1 drop 2 (two) times daily.    Marland Kitchen estradiol (VIVELLE-DOT) 0.05 MG/24HR patch Place 1 patch (0.05 mg total) onto the skin 2 (two) times a week. 8 patch 12  . lansoprazole (PREVACID) 15 MG  capsule TAKE 1 CAPSULE EVERY DAY 90 capsule 3  . Misc Natural Products (LUTEIN 20) CAPS Take 1 tablet by mouth daily.    . Multiple Vitamin (MULTIVITAMIN) tablet Take 1 tablet by mouth daily.    . psyllium (METAMUCIL) 58.6 % packet Take 1 packet by mouth daily.    . risedronate (ACTONEL) 150 MG tablet Take 1 tablet (150 mg total) by mouth every 30 (thirty) days. with water on empty stomach, nothing by mouth or lie down for next 30 minutes. 1 tablet 11  . rosuvastatin (CRESTOR) 10 MG tablet Take 1 tablet (10 mg total) by mouth daily. 90 tablet 3   No current facility-administered medications on file prior to visit.    Review of Systems All otherwise neg per pt     Objective:   Physical Exam BP 128/78   Pulse 76   Temp 98.6 F (37 C) (Oral)   Resp 20   Wt 118 lb (53.5 kg)   SpO2 98%   BMI 20.90 kg/m  VS noted,  Constitutional: Pt appears in no apparent distress HENT: Head: NCAT.  Right Ear: External ear normal.  Left Ear: External ear normal.  Eyes: . Pupils are equal, round, and reactive to light. Conjunctivae and EOM are normal Bilat tm's with mild  erythema.  Max sinus areas mild tender.  Pharynx with mild erythema, no exudate Neck: Normal range of motion. Neck supple.  Cardiovascular: Normal rate and regular rhythm.   Pulmonary/Chest: Effort normal and breath sounds without rales or wheezing.  Neurological: Pt is alert. Not confused , motor grossly intact Skin: Skin is warm. No rash, no LE edema Psychiatric: Pt behavior is normal. No agitation.  No other significant exam findings    Assessment & Plan:

## 2016-01-10 NOTE — Assessment & Plan Note (Signed)
Mild to mod, for antibx course,  to f/u any worsening symptoms or concerns 

## 2016-01-19 ENCOUNTER — Telehealth: Payer: Self-pay | Admitting: Emergency Medicine

## 2016-01-19 DIAGNOSIS — H9203 Otalgia, bilateral: Secondary | ICD-10-CM

## 2016-01-19 NOTE — Telephone Encounter (Signed)
Ok will refer

## 2016-01-19 NOTE — Telephone Encounter (Signed)
Pt called and is having inner ear problems. She is wondering if she can get a referral to the [facility] doctor. She would like to be referred to Dr Redmond Baseman with [facility]. Please advise thanks.

## 2016-02-11 DIAGNOSIS — R2689 Other abnormalities of gait and mobility: Secondary | ICD-10-CM | POA: Insufficient documentation

## 2016-02-11 DIAGNOSIS — H9041 Sensorineural hearing loss, unilateral, right ear, with unrestricted hearing on the contralateral side: Secondary | ICD-10-CM | POA: Diagnosis not present

## 2016-02-24 ENCOUNTER — Other Ambulatory Visit: Payer: Self-pay | Admitting: Gynecology

## 2016-03-06 DIAGNOSIS — D225 Melanocytic nevi of trunk: Secondary | ICD-10-CM | POA: Diagnosis not present

## 2016-03-06 DIAGNOSIS — D1801 Hemangioma of skin and subcutaneous tissue: Secondary | ICD-10-CM | POA: Diagnosis not present

## 2016-03-06 DIAGNOSIS — L821 Other seborrheic keratosis: Secondary | ICD-10-CM | POA: Diagnosis not present

## 2016-03-06 DIAGNOSIS — L570 Actinic keratosis: Secondary | ICD-10-CM | POA: Diagnosis not present

## 2016-03-06 DIAGNOSIS — Z85828 Personal history of other malignant neoplasm of skin: Secondary | ICD-10-CM | POA: Diagnosis not present

## 2016-03-06 DIAGNOSIS — L812 Freckles: Secondary | ICD-10-CM | POA: Diagnosis not present

## 2016-03-07 ENCOUNTER — Other Ambulatory Visit: Payer: Self-pay | Admitting: Gynecology

## 2016-03-21 ENCOUNTER — Encounter: Payer: Self-pay | Admitting: Gynecology

## 2016-03-21 DIAGNOSIS — Z1231 Encounter for screening mammogram for malignant neoplasm of breast: Secondary | ICD-10-CM | POA: Diagnosis not present

## 2016-03-22 ENCOUNTER — Ambulatory Visit (INDEPENDENT_AMBULATORY_CARE_PROVIDER_SITE_OTHER): Payer: Medicare HMO | Admitting: Gynecology

## 2016-03-22 ENCOUNTER — Encounter: Payer: Self-pay | Admitting: Gynecology

## 2016-03-22 VITALS — BP 118/78 | Ht 63.0 in | Wt 118.0 lb

## 2016-03-22 DIAGNOSIS — Z01411 Encounter for gynecological examination (general) (routine) with abnormal findings: Secondary | ICD-10-CM

## 2016-03-22 DIAGNOSIS — M8580 Other specified disorders of bone density and structure, unspecified site: Secondary | ICD-10-CM

## 2016-03-22 MED ORDER — ESTRADIOL 0.05 MG/24HR TD PTTW: 1.0000 | MEDICATED_PATCH | TRANSDERMAL | 4 refills | Status: DC

## 2016-03-22 NOTE — Patient Instructions (Signed)
Bone Densitometry Introduction Bone densitometry is an imaging test that uses a special X-ray to measure the amount of calcium and other minerals in your bones (bone density). This test is also known as a bone mineral density test or dual-energy X-ray absorptiometry (DXA). The test can measure bone density at your hip and your spine. It is similar to having a regular X-ray. You may have this test to:  Diagnose a condition that causes weak or thin bones (osteoporosis).  Predict your risk of a broken bone (fracture).  Determine how well osteoporosis treatment is working. Tell a health care provider about:  Any allergies you have.  All medicines you are taking, including vitamins, herbs, eye drops, creams, and over-the-counter medicines.  Any problems you or family members have had with anesthetic medicines.  Any blood disorders you have.  Any surgeries you have had.  Any medical conditions you have.  Possibility of pregnancy.  Any other medical test you had within the previous 14 days that used contrast material. What are the risks? Generally, this is a safe procedure. However, problems can occur and may include the following:  This test exposes you to a very small amount of radiation.  The risks of radiation exposure may be greater to unborn children. What happens before the procedure?  Do not take any calcium supplements for 24 hours before having the test. You can otherwise eat and drink what you usually do.  Take off all metal jewelry, eyeglasses, dental appliances, and any other metal objects. What happens during the procedure?  You may lie on an exam table. There will be an X-ray generator below you and an imaging device above you.  Other devices, such as boxes or braces, may be used to position your body properly for the scan.  You will need to lie still while the machine slowly scans your body.  The images will show up on a computer monitor. What happens after the  procedure? You may need more testing at a later time. This information is not intended to replace advice given to you by your health care provider. Make sure you discuss any questions you have with your health care provider. Document Released: 03/07/2004 Document Revised: 07/22/2015 Document Reviewed: 07/24/2013  2017 Elsevier  

## 2016-03-22 NOTE — Progress Notes (Signed)
Isabel Christensen 02-03-1946 [id number]   History:    71 y.o.  for annual gyn exam with no complaints today. Patient was seen in the office in February 2017 to discuss her bone density study that had been done on January 2016. Her bone density study had demonstrated the following:  Patient's lowest T score was at the right femoral neck with a value of -1.4 but with a Frax analysis indicating a 6.7% risk of fracture of her hip above threshold cut off range.  Patient smokes half a pack cigarette per day and consumes 2-3 alcoholic beverages a day. Patient also has a sister with osteoporosis. Patient with low body mass index increasing her risk of osteoporosis and fractures even more. She states that she is taking her calcium and vitamin D. Patient's mother also had history of osteoporosis. I have recommended that she wait until after she completes her dental work this year and then perhaps worsening of the year early part of next year then begin the Prolia since she suffers from gastroesophageal reflux she cannot take oral bisphosphonate.  She is no longer pursuing dental implants and will have a bone density study here in the next few weeks to once again consider prescribing Prolia 60 mg subcutaneous every 6 months. She was treated for vitamin D deficiency and is currently taking vitamin D 2000 units daily.  Patient continues to use Vivelle Dot 0.05 mg transdermal patch twice a week which she has tried on numerous occasions to discontinued but has not wanted to. She has been counseled on numerous occasions detrimental effects such as DVT, pulmonary embolism and breast cancer. Patient had a normal colonoscopy in 2011. Her primary care physician has been doing her blood work her shingles vaccine is up-to-date but she declined the flu vaccine.Patient denies any prior history of abnormal Pap smears. She did bring up to my attention in both her mother and sister both had breast cancer at the age of 57. No family  history of ovarian or colorectal cancer reported. She also has had history in the past of microscopic hematuria and had been followed by the urologist. She states that this issue has been addressed and corrected and she is no longer had any episodes of hematuria.   Past medical history,surgical history, family history and social history were all reviewed and documented in the EPIC chart.  Gynecologic History No LMP recorded. Patient has had a hysterectomy. Contraception: status post hysterectomy Last Pap: 2012. Results were: normal Last mammogram: 2018. Results were: normal  Obstetric History OB History  Gravida Para Term Preterm AB Living  1       1 0  SAB TAB Ectopic Multiple Live Births               # Outcome Date GA Lbr Len/2nd Weight Sex Delivery Anes PTL Lv  1 AB                ROS: A ROS was performed and pertinent positives and negatives are included in the history.  GENERAL: No fevers or chills. HEENT: No change in vision, no earache, sore throat or sinus congestion. NECK: No pain or stiffness. CARDIOVASCULAR: No chest pain or pressure. No palpitations. PULMONARY: No shortness of breath, cough or wheeze. GASTROINTESTINAL: No abdominal pain, nausea, vomiting or diarrhea, melena or bright red blood per rectum. GENITOURINARY: No urinary frequency, urgency, hesitancy or dysuria. MUSCULOSKELETAL: No joint or muscle pain, no back pain, no recent trauma. DERMATOLOGIC: No rash, no itching,  no lesions. ENDOCRINE: No polyuria, polydipsia, no heat or cold intolerance. No recent change in weight. HEMATOLOGICAL: No anemia or easy bruising or bleeding. NEUROLOGIC: No headache, seizures, numbness, tingling or weakness. PSYCHIATRIC: No depression, no loss of interest in normal activity or change in sleep pattern.     Exam: chaperone present  BP 118/78   Ht 5\' 3"  (1.6 m)   Wt 118 lb (53.5 kg)   BMI 20.90 kg/m   Body mass index is 20.9 kg/m.  General appearance : Well developed well  nourished female. No acute distress HEENT: Eyes: no retinal hemorrhage or exudates,  Neck supple, trachea midline, no carotid bruits, no thyroidmegaly Lungs: Clear to auscultation, no rhonchi or wheezes, or rib retractions  Heart: Regular rate and rhythm, no murmurs or gallops Breast:Examined in sitting and supine position were symmetrical in appearance, no palpable masses or tenderness,  no skin retraction, no nipple inversion, no nipple discharge, no skin discoloration, no axillary or supraclavicular lymphadenopathy Abdomen: no palpable masses or tenderness, no rebound or guarding Extremities: no edema or skin discoloration or tenderness  Pelvic:  Bartholin, Urethra, Skene Glands: Within normal limits             Vagina: No gross lesions or discharge  Cervix: Absent  Uterus  absent  Adnexa  Without masses or tenderness  Anus and perineum  normal   Rectovaginal  normal sphincter tone without palpated masses or tenderness             Hemoccult PCP provides     Assessment/Plan:  71 y.o. female for annual exam with osteopenia but high-risk for fracture based on the following: Thin with low body mass index her weight 114 pounds Chronic smoker Consumption of 3 alcoholic beverages per day Family history of osteoporosis Frax analysis exceeding threshold cut off. Increase fracture risk of 6.7% in the next 10 years  Patient with history of esophageal reflux we'll not be a candidate for oral bisphosphonate. I'm going to recommend subcutaneous Prolia 60 mg subcutaneous every 6 months after we evaluate the bone density study in the next couple of weeks.we'll need to check a calcium vitamin D level at that time as well. Patient was once again counseled on the detrimental effects of low dose estrogen at the age of 47 and been a smoker. She's going to try to see if she can cut the transdermal patch in half and use half the dose twice a week and begin to taper off. We discussed importance of calcium  vitamin D and weightbearing exercise for osteoporosis prevention. He counseled her also on  smoking.   Terrance Mass MD, 10:56 AM 03/22/2016

## 2016-03-31 DIAGNOSIS — H2513 Age-related nuclear cataract, bilateral: Secondary | ICD-10-CM | POA: Diagnosis not present

## 2016-03-31 DIAGNOSIS — H43822 Vitreomacular adhesion, left eye: Secondary | ICD-10-CM | POA: Diagnosis not present

## 2016-03-31 DIAGNOSIS — H43813 Vitreous degeneration, bilateral: Secondary | ICD-10-CM | POA: Diagnosis not present

## 2016-03-31 DIAGNOSIS — H353131 Nonexudative age-related macular degeneration, bilateral, early dry stage: Secondary | ICD-10-CM | POA: Diagnosis not present

## 2016-04-18 ENCOUNTER — Encounter: Payer: Self-pay | Admitting: Family Medicine

## 2016-04-18 ENCOUNTER — Ambulatory Visit (INDEPENDENT_AMBULATORY_CARE_PROVIDER_SITE_OTHER): Payer: Medicare HMO | Admitting: Family Medicine

## 2016-04-18 VITALS — BP 120/80 | HR 89 | Temp 98.4°F | Resp 12 | Ht 63.0 in | Wt 115.4 lb

## 2016-04-18 DIAGNOSIS — R6889 Other general symptoms and signs: Secondary | ICD-10-CM

## 2016-04-18 DIAGNOSIS — J069 Acute upper respiratory infection, unspecified: Secondary | ICD-10-CM

## 2016-04-18 DIAGNOSIS — R55 Syncope and collapse: Secondary | ICD-10-CM | POA: Diagnosis not present

## 2016-04-18 DIAGNOSIS — R110 Nausea: Secondary | ICD-10-CM

## 2016-04-18 LAB — POC INFLUENZA A&B (BINAX/QUICKVUE)
INFLUENZA A, POC: NEGATIVE
INFLUENZA B, POC: NEGATIVE

## 2016-04-18 MED ORDER — OSELTAMIVIR PHOSPHATE 75 MG PO CAPS: 75.0000 mg | ORAL_CAPSULE | Freq: Two times a day (BID) | ORAL | 0 refills | Status: AC

## 2016-04-18 MED ORDER — ONDANSETRON HCL 4 MG PO TABS: 4.0000 mg | ORAL_TABLET | Freq: Three times a day (TID) | ORAL | 0 refills | Status: AC | PRN

## 2016-04-18 MED ORDER — BENZONATATE 100 MG PO CAPS: 200.0000 mg | ORAL_CAPSULE | Freq: Two times a day (BID) | ORAL | 0 refills | Status: AC | PRN

## 2016-04-18 NOTE — Patient Instructions (Addendum)
  [name] I have seen you today for an acute visit.  A few things to remember from today's visit:   Syncope, unspecified syncope type  Nausea without vomiting  URI, acute  viral infections are self-limited and we treat each symptom depending of severity.   Flu test was negative, we agreed with Tamiflu.  Monitor for new symptoms.  Rest. If another episode of passing out please follow with PCP.   Over the counter medications as decongestants and cold medications usually help, they need to be taken with caution if there is a history of high blood pressure or palpitations. Tylenol and/or Ibuprofen also helps with most symptoms (headache, muscle aching, fever,etc) Plenty of fluids. Honey helps with cough. Steam inhalations helps with runny nose, nasal congestion, and may prevent sinus infections. Cough and nasal congestion could last a few days and sometimes weeks. Please follow in not any better in 1-2 weeks or if symptoms get worse.    Medications prescribed today are intended for short period of time and will not be refill upon request, a follow up appointment might be necessary to discuss continuation of of treatment if appropriate.     In general please monitor for signs of worsening symptoms and seek immediate medical attention if any concerning.  If symptoms are not resolved in 1-2 weeks you should schedule a follow up appointment with your doctor, before if needed.  Please be sure you have an appointment already scheduled with your PCP before you leave today.

## 2016-04-18 NOTE — Progress Notes (Signed)
HPI:   ACUTE VISIT:  Chief Complaint  Patient presents with  . Chills  . Nausea    Ms.Blessyn Zweifel is a 71 y.o. female, who is here today complaining of nausea and chills that started today around 5 am. Around 5 AM today she cannot to the bathroom and had an episode of syncope. According to patient, her husband was with her and kept her from falling, episode lasted about 10 seconds.  She denies associated focal weakness, chest pain, palpitations, visual changes, urine/bowel incontinence, or seizure-like activity. No prior history of syncope. She also has had frontal headache "not real bad", pressure-like sensation.  Mild nasal congestion, rhinorrhea, and sore throat. She has not checked temp, not sure about fever. + Body aches. She denies dyspnea or wheezing.  No Hx of recent travel. Sick contact:Father in low has similar symptoms, she is taking care of him. No known insect bite. No Hx of allergies. + Smoker.  Medication OTC for this problem:None Symptoms otherwise stable.  Hx of GERD on PPI.   Review of Systems  Constitutional: Positive for appetite change, chills and fatigue.  HENT: Positive for congestion, postnasal drip and sore throat. Negative for ear pain, mouth sores, nosebleeds, rhinorrhea, sinus pressure, sneezing, trouble swallowing and voice change.   Eyes: Negative for discharge, redness and itching.  Respiratory: Positive for cough. Negative for shortness of breath and wheezing.   Cardiovascular: Negative for chest pain, palpitations and leg swelling.  Gastrointestinal: Positive for abdominal pain (upper burning sensation when first started with nasuea.) and nausea. Negative for diarrhea and vomiting.  Genitourinary: Negative for decreased urine volume and dysuria.  Musculoskeletal: Positive for myalgias. Negative for gait problem and neck pain.  Skin: Negative for rash.  Neurological: Positive for syncope and headaches. Negative for tremors, facial  asymmetry, speech difficulty, weakness and numbness.  Hematological: Negative for adenopathy. Does not bruise/bleed easily.  Psychiatric/Behavioral: Negative for confusion. The patient is not nervous/anxious.       Current Outpatient Prescriptions on File Prior to Visit  Medication Sig Dispense Refill  . Biotin 1 MG CAPS Take 1 capsule by mouth daily.    . cycloSPORINE (RESTASIS) 0.05 % ophthalmic emulsion 1 drop 2 (two) times daily.    Marland Kitchen estradiol (VIVELLE-DOT) 0.05 MG/24HR patch Place 1 patch (0.05 mg total) onto the skin 2 (two) times a week. 24 patch 4  . lansoprazole (PREVACID) 15 MG capsule TAKE 1 CAPSULE EVERY DAY 90 capsule 3  . Misc Natural Products (LUTEIN 20) CAPS Take 1 tablet by mouth daily.    . Multiple Vitamin (MULTIVITAMIN) tablet Take 1 tablet by mouth daily.    . psyllium (METAMUCIL) 58.6 % packet Take 1 packet by mouth daily.     No current facility-administered medications on file prior to visit.      Past Medical History:  Diagnosis Date  . Brain aneurysm   . DIVERTICULOSIS, COLON 06/05/2007   Qualifier: Diagnosis of  By: Jenny Reichmann MD, Hunt Oris   . GERD 06/05/2007   Qualifier: Diagnosis of  By: Elveria Royals   . GOITER 03/03/2008   Qualifier: Diagnosis of  By: Jenny Reichmann MD, Hunt Oris   . Hematuria    FOLLOWED BY UROLOGIST FOR THIS  . HYPERLIPIDEMIA 06/05/2007   Qualifier: Diagnosis of  By: Jenny Reichmann MD, Hunt Oris   . Osteopenia   . Vitamin D deficiency    Allergies  Allergen Reactions  . Aspirin     REACTION: brain aneurysm  . Black  Cohosh     REACTION: hives  . Promethazine Hcl     REACTION: rash    Social History   Social History  . Marital status: Married    Spouse name: N/A  . Number of children: N/A  . Years of education: N/A   Social History Main Topics  . Smoking status: Current Every Day Smoker    Packs/day: 0.50    Types: Cigarettes  . Smokeless tobacco: Never Used  . Alcohol use 6.0 oz/week    10 Standard drinks or equivalent per week  .  Drug use: No  . Sexual activity: Yes    Birth control/ protection: Surgical   Other Topics Concern  . None   Social History Narrative  . None    Vitals:   04/18/16 1003  BP: 120/80  Pulse: 89  Resp: 12  Temp: 98.4 F (36.9 C)  O2 sat 97% at RA.  Body mass index is 20.44 kg/m.   Physical Exam  Nursing note and vitals reviewed. Constitutional: She is oriented to person, place, and time. She appears well-developed and well-nourished. She does not appear ill. No distress.  HENT:  Head: Atraumatic.  Right Ear: External ear and ear canal normal. Tympanic membrane is not erythematous.  Left Ear: External ear and ear canal normal. Tympanic membrane is not erythematous.  Nose: Rhinorrhea present. Right sinus exhibits no maxillary sinus tenderness and no frontal sinus tenderness. Left sinus exhibits no maxillary sinus tenderness and no frontal sinus tenderness.  Mouth/Throat: Uvula is midline and mucous membranes are normal. Posterior oropharyngeal erythema (mild) present. No oropharyngeal exudate or posterior oropharyngeal edema.  Excess cerumen bilateral, TM seen partially.  Eyes: Conjunctivae and EOM are normal. Pupils are equal, round, and reactive to light.  Neck: Neck supple. No muscular tenderness present.  Cardiovascular: Normal rate and regular rhythm.   No murmur heard. Respiratory: Effort normal and breath sounds normal. No stridor. No respiratory distress.  GI: Soft. She exhibits no mass. There is no hepatomegaly. There is no tenderness.  Musculoskeletal: She exhibits no edema.  Lymphadenopathy:    She has no cervical adenopathy.  Neurological: She is alert and oriented to person, place, and time. She has normal strength. No cranial nerve deficit. Coordination and gait normal.  Skin: Skin is warm. No rash noted. No erythema.  Psychiatric: She has a normal mood and affect. Her speech is normal.  Well groomed, good eye contact.      ASSESSMENT AND PLAN:   Sherece  was seen today for chills and nausea.  Diagnoses and all orders for this visit:  Syncope, unspecified syncope type  Most likely vasovagal syncope.  We discussed other possible etiologies. She has not had labs in about a year, so we can do some today, I explaned. She is not interested in doing lab work today. Adequate hydration. Clearly instructed about warning signs.  Nausea without vomiting  Small and frequent sips of clear fluids. Instructed about warning signs. Zofran to take as needed.  -     ondansetron (ZOFRAN) 4 MG tablet; Take 1 tablet (4 mg total) by mouth every 8 (eight) hours as needed for nausea or vomiting.  URI, acute  Symptoms suggests a viral etiology, symptomatic treatment recommended.  Instructed to monitor for signs of complications,clearly instructed about warning signs. I also explained that cough and nasal congestion can last a few days and sometimes weeks. F/U as needed.   -     POC Influenza A&B (Binax test) -  oseltamivir (TAMIFLU) 75 MG capsule; Take 1 capsule (75 mg total) by mouth 2 (two) times daily. -     benzonatate (TESSALON) 100 MG capsule; Take 2 capsules (200 mg total) by mouth 2 (two) times daily as needed for cough.  Flu-like symptoms  Rapid flu test here in the office negative. We discussed risk vs benefit of treating empirically with Tamiflu, given her risk factors for complications from Influenza. We discussed some side effects of medication. She would like to try Tamiflu.  -     oseltamivir (TAMIFLU) 75 MG capsule; Take 1 capsule (75 mg total) by mouth 2 (two) times daily.      Return if symptoms worsen or fail to improve.     -Ms.Torria Grealish was advised to return or notify a doctor immediately if symptoms worsen or new concerns arise.       Rubel Heckard G. Martin[place], MD  [facility]. [place] office.

## 2016-04-18 NOTE — Progress Notes (Signed)
Pre visit review using our clinic review tool, if applicable. No additional management support is needed unless otherwise documented below in the visit note. 

## 2016-05-15 ENCOUNTER — Encounter: Payer: Self-pay | Admitting: Family

## 2016-05-15 ENCOUNTER — Ambulatory Visit (INDEPENDENT_AMBULATORY_CARE_PROVIDER_SITE_OTHER): Payer: Medicare HMO | Admitting: Family

## 2016-05-15 VITALS — BP 138/78 | HR 73 | Temp 98.2°F | Resp 16 | Ht 63.0 in | Wt 118.0 lb

## 2016-05-15 DIAGNOSIS — J0140 Acute pansinusitis, unspecified: Secondary | ICD-10-CM | POA: Diagnosis not present

## 2016-05-15 MED ORDER — AMOXICILLIN-POT CLAVULANATE 875-125 MG PO TABS: 1.0000 | ORAL_TABLET | Freq: Two times a day (BID) | ORAL | 0 refills | Status: DC

## 2016-05-15 NOTE — Assessment & Plan Note (Signed)
Symptoms and exam consistent with sinusitis cannot rule out viral infection. Written prescription for Augmentin provided with instructions for watchful waiting for the next 24-48 hours. Continue over-the-counter medications as needed for symptom relief and supportive care. Follow-up if symptoms worsen or do not improve.

## 2016-05-15 NOTE — Patient Instructions (Addendum)
Thank you for choosing [facility].  SUMMARY AND INSTRUCTIONS:  Recommend Aleve-D.  Filll antibiotic if no improvement in the next 24-48 hours.   Medication:  Your prescription(s) have been submitted to your pharmacy or been printed and provided for you. Please take as directed and contact our office if you believe you are having problem(s) with the medication(s) or have any questions.  Follow up:  If your symptoms worsen or fail to improve, please contact our office for further instruction, or in case of emergency go directly to the emergency room at the closest medical facility.   General Recommendations:    Please drink plenty of fluids.  Get plenty of rest   Sleep in humidified air  Use saline nasal sprays  Netti pot   OTC Medications:  Decongestants - helps relieve congestion   Flonase (generic fluticasone) or Nasacort (generic triamcinolone) - please make sure to use the "cross-over" technique at a 45 degree angle towards the opposite eye as opposed to straight up the nasal passageway.   Sudafed (generic pseudoephedrine - Note this is the one that is available behind the pharmacy counter); Products with phenylephrine (-PE) may also be used but is often not as effective as pseudoephedrine.   If you have HIGH BLOOD PRESSURE - Coricidin HBP; AVOID any product that is -D as this contains pseudoephedrine which may increase your blood pressure.  Afrin (oxymetazoline) every 6-8 hours for up to 3 days.   Allergies - helps relieve runny nose, itchy eyes and sneezing   Claritin (generic loratidine), Allegra (fexofenidine), or Zyrtec (generic cyrterizine) for runny nose. These medications should not cause drowsiness.  Note - Benadryl (generic diphenhydramine) may be used however may cause drowsiness  Cough -   Delsym or Robitussin (generic dextromethorphan)  Expectorants - helps loosen mucus to ease removal   Mucinex (generic guaifenesin) as directed on the  package.  Headaches / General Aches   Tylenol (generic acetaminophen) - DO NOT EXCEED 3 grams (3,000 mg) in a 24 hour time period  Advil/Motrin (generic ibuprofen)   Sore Throat -   Salt water gargle   Chloraseptic (generic benzocaine) spray or lozenges / Sucrets (generic dyclonine)

## 2016-05-15 NOTE — Progress Notes (Signed)
Subjective:    Patient ID: Isabel Christensen, female    DOB: [birth year], 71 y.o.   MRN: [id number]  Chief Complaint  Patient presents with  . Nasal Congestion    congestion, some nausea, weak, little cough, x5 days    HPI:  Isabel Christensen is a 71 y.o. female who  has a past medical history of Brain aneurysm; DIVERTICULOSIS, COLON (06/05/2007); GERD (06/05/2007); GOITER (03/03/2008); Hematuria; HYPERLIPIDEMIA (06/05/2007); Osteopenia; and Vitamin D deficiency. and presents today for an acute office visit.  This is a new problem. Associated symptom of congestion, nausea, cough and feeling weak have been going on for about 5 days. Describes that she feels like her head is going to explode. Modifying factors include Sudafed which did seem to help with her symptoms. Denies fevers. Symptoms are generally worse at night. Course of the symptoms have slightly worsening since initial onset. No recent antibiotics.   Allergies  Allergen Reactions  . Aspirin     REACTION: brain aneurysm  . Black Cohosh     REACTION: hives  . Promethazine Hcl     REACTION: rash     Outpatient Medications Prior to Visit  Medication Sig Dispense Refill  . Biotin 1 MG CAPS Take 1 capsule by mouth daily.    . cycloSPORINE (RESTASIS) 0.05 % ophthalmic emulsion 1 drop 2 (two) times daily.    Marland Kitchen estradiol (VIVELLE-DOT) 0.05 MG/24HR patch Place 1 patch (0.05 mg total) onto the skin 2 (two) times a week. 24 patch 4  . lansoprazole (PREVACID) 15 MG capsule TAKE 1 CAPSULE EVERY DAY 90 capsule 3  . Misc Natural Products (LUTEIN 20) CAPS Take 1 tablet by mouth daily.    . Multiple Vitamin (MULTIVITAMIN) tablet Take 1 tablet by mouth daily.    . psyllium (METAMUCIL) 58.6 % packet Take 1 packet by mouth daily.     No facility-administered medications prior to visit.     Review of Systems  Constitutional: Negative for chills and fever.  HENT: Positive for congestion, sinus pain and sinus pressure. Negative for ear pain and sore  throat.   Respiratory: Positive for cough. Negative for chest tightness and shortness of breath.   Neurological: Negative for headaches.      Objective:    BP 138/78 (BP Location: Left Arm, Patient Position: Sitting, Cuff Size: Normal)   Pulse 73   Temp 98.2 F (36.8 C) (Oral)   Resp 16   Ht 5\' 3"  (1.6 m)   Wt 118 lb (53.5 kg)   SpO2 96%   BMI 20.90 kg/m  Nursing note and vital signs reviewed.  Physical Exam  Constitutional: She is oriented to person, place, and time. She appears well-developed and well-nourished.  HENT:  Right Ear: Hearing, tympanic membrane, external ear and ear canal normal.  Left Ear: Hearing, tympanic membrane, external ear and ear canal normal.  Nose: Right sinus exhibits no maxillary sinus tenderness and no frontal sinus tenderness. Left sinus exhibits no maxillary sinus tenderness and no frontal sinus tenderness.  Mouth/Throat: Uvula is midline, oropharynx is clear and moist and mucous membranes are normal.  Neck: Neck supple.  Cardiovascular: Normal rate, regular rhythm, normal heart sounds and intact distal pulses.   Pulmonary/Chest: Effort normal and breath sounds normal.  Neurological: She is alert and oriented to person, place, and time.  Skin: Skin is warm and dry.       Assessment & Plan:   Problem List Items Addressed This Visit      Respiratory  Acute non-recurrent pansinusitis - Primary    Symptoms and exam consistent with sinusitis cannot rule out viral infection. Written prescription for Augmentin provided with instructions for watchful waiting for the next 24-48 hours. Continue over-the-counter medications as needed for symptom relief and supportive care. Follow-up if symptoms worsen or do not improve.      Relevant Medications   amoxicillin-clavulanate (AUGMENTIN) 875-125 MG tablet       I am having Ms. Kopke start on amoxicillin-clavulanate. I am also having her maintain her multivitamin, Biotin, psyllium, lansoprazole, LUTEIN  20, cycloSPORINE, and estradiol.   Meds ordered this encounter  Medications  . amoxicillin-clavulanate (AUGMENTIN) 875-125 MG tablet    Sig: Take 1 tablet by mouth 2 (two) times daily.    Dispense:  14 tablet    Refill:  0    Order Specific Question:   Supervising Provider    Answer:   Pricilla Holm A [[id number]]     Follow-up: Return if symptoms worsen or fail to improve.  Mauricio Po, FNP

## 2016-07-12 ENCOUNTER — Encounter: Payer: Self-pay | Admitting: Gynecology

## 2016-08-04 DIAGNOSIS — Z85828 Personal history of other malignant neoplasm of skin: Secondary | ICD-10-CM | POA: Diagnosis not present

## 2016-08-04 DIAGNOSIS — L570 Actinic keratosis: Secondary | ICD-10-CM | POA: Diagnosis not present

## 2016-08-04 DIAGNOSIS — D485 Neoplasm of uncertain behavior of skin: Secondary | ICD-10-CM | POA: Diagnosis not present

## 2016-11-15 DIAGNOSIS — K219 Gastro-esophageal reflux disease without esophagitis: Secondary | ICD-10-CM | POA: Diagnosis not present

## 2016-11-15 DIAGNOSIS — Z972 Presence of dental prosthetic device (complete) (partial): Secondary | ICD-10-CM | POA: Diagnosis not present

## 2016-11-15 DIAGNOSIS — Z681 Body mass index (BMI) 19 or less, adult: Secondary | ICD-10-CM | POA: Diagnosis not present

## 2016-11-15 DIAGNOSIS — H9311 Tinnitus, right ear: Secondary | ICD-10-CM | POA: Diagnosis not present

## 2016-11-15 DIAGNOSIS — R69 Illness, unspecified: Secondary | ICD-10-CM | POA: Diagnosis not present

## 2016-11-15 DIAGNOSIS — Z0000 Encounter for general adult medical examination without abnormal findings: Secondary | ICD-10-CM | POA: Diagnosis not present

## 2016-11-15 DIAGNOSIS — K08409 Partial loss of teeth, unspecified cause, unspecified class: Secondary | ICD-10-CM | POA: Diagnosis not present

## 2016-11-15 DIAGNOSIS — Z79899 Other long term (current) drug therapy: Secondary | ICD-10-CM | POA: Diagnosis not present

## 2016-11-15 DIAGNOSIS — Z79890 Hormone replacement therapy (postmenopausal): Secondary | ICD-10-CM | POA: Diagnosis not present

## 2016-11-15 DIAGNOSIS — N951 Menopausal and female climacteric states: Secondary | ICD-10-CM | POA: Diagnosis not present

## 2017-01-24 ENCOUNTER — Ambulatory Visit (INDEPENDENT_AMBULATORY_CARE_PROVIDER_SITE_OTHER)
Admission: RE | Admit: 2017-01-24 | Discharge: 2017-01-24 | Disposition: A | Discharge status: Home / Self Care | Payer: Medicare HMO | Source: Ambulatory Visit | Attending: Family | Admitting: Family

## 2017-01-24 ENCOUNTER — Other Ambulatory Visit (INDEPENDENT_AMBULATORY_CARE_PROVIDER_SITE_OTHER): Payer: Medicare HMO

## 2017-01-24 ENCOUNTER — Ambulatory Visit (INDEPENDENT_AMBULATORY_CARE_PROVIDER_SITE_OTHER): Payer: Medicare HMO | Admitting: Family

## 2017-01-24 ENCOUNTER — Encounter: Payer: Self-pay | Admitting: Family

## 2017-01-24 VITALS — BP 108/68 | HR 74 | Temp 97.9°F | Ht 63.0 in | Wt 114.0 lb

## 2017-01-24 DIAGNOSIS — R05 Cough: Secondary | ICD-10-CM

## 2017-01-24 DIAGNOSIS — Z720 Tobacco use: Secondary | ICD-10-CM | POA: Diagnosis not present

## 2017-01-24 DIAGNOSIS — R194 Change in bowel habit: Secondary | ICD-10-CM

## 2017-01-24 DIAGNOSIS — R058 Other specified cough: Secondary | ICD-10-CM

## 2017-01-24 LAB — COMPREHENSIVE METABOLIC PANEL
ALT: 18 U/L (ref 0–35)
AST: 28 U/L (ref 0–37)
Albumin: 4 g/dL (ref 3.5–5.2)
Alkaline Phosphatase: 76 U/L (ref 39–117)
BUN: 6 mg/dL (ref 6–23)
CHLORIDE: 102 meq/L (ref 96–112)
CO2: 29 meq/L (ref 19–32)
Calcium: 9.7 mg/dL (ref 8.4–10.5)
Creatinine, Ser: 0.64 mg/dL (ref 0.40–1.20)
GFR: 96.99 mL/min (ref >60.00)
GLUCOSE: 134 mg/dL — AB (ref 70–99)
POTASSIUM: 3.9 meq/L (ref 3.5–5.1)
SODIUM: 138 meq/L (ref 135–145)
TOTAL PROTEIN: 7.2 g/dL (ref 6.0–8.3)
Total Bilirubin: 0.3 mg/dL (ref 0.2–1.2)

## 2017-01-24 LAB — CBC
HEMATOCRIT: 39.4 % (ref 36.0–46.0)
Hemoglobin: 13.6 g/dL (ref 12.0–15.0)
MCHC: 34.4 g/dL (ref 30.0–36.0)
MCV: 97.1 fl (ref 78.0–100.0)
Platelets: 473 10*3/uL — ABNORMAL HIGH (ref 150.0–400.0)
RBC: 4.06 Mil/uL (ref 3.87–5.11)
RDW: 12.4 % (ref 11.5–15.5)
WBC: 6.3 10*3/uL (ref 4.0–10.5)

## 2017-01-24 MED ORDER — DOXYCYCLINE HYCLATE 100 MG PO TABS
100.0000 mg | ORAL_TABLET | Freq: Two times a day (BID) | ORAL | 0 refills | Status: DC
Start: 2017-01-24 — End: 2017-02-13

## 2017-01-24 MED ORDER — FLUTICASONE PROPIONATE 50 MCG/ACT NA SUSP: 2.0000 | Freq: Every day | NASAL | 6 refills | Status: DC

## 2017-01-24 NOTE — Patient Instructions (Signed)
I will be in touch with results from CXR; GI should be contacting you. Please quit smoking; please let me know if you aren't feeling better.

## 2017-01-24 NOTE — Progress Notes (Signed)
Isabel Christensen is a 71 y.o. female with the following history as recorded in Epic[employer]:  Patient Active Problem List   Diagnosis Date Noted  . Acute non-recurrent pansinusitis 05/15/2016  . Acute upper respiratory infection 01/03/2016  . H/O vitamin D deficiency 04/01/2015  . Osteopenia 01/20/2015  . Vaginal atrophy 01/15/2014  . Smoking 04/23/2012  . Preventative health care 03/07/2012  . Brain aneurysm 03/07/2012  . Hypertrophy of salivary gland 01/26/2010  . BACK PAIN 11/17/2008  . GOITER 03/03/2008  . Hyperlipidemia 06/05/2007  . GERD 06/05/2007  . DIVERTICULOSIS, COLON 06/05/2007    Current Outpatient Medications  Medication Sig Dispense Refill  . Biotin 1 MG CAPS Take 1 capsule by mouth daily.    Marland Kitchen estradiol (VIVELLE-DOT) 0.05 MG/24HR patch Place 1 patch (0.05 mg total) onto the skin 2 (two) times a week. 24 patch 4  . lansoprazole (PREVACID) 15 MG capsule TAKE 1 CAPSULE EVERY DAY 90 capsule 3  . Misc Natural Products (LUTEIN 20) CAPS Take 1 tablet by mouth daily.    . Multiple Vitamin (MULTIVITAMIN) tablet Take 1 tablet by mouth daily.    . psyllium (METAMUCIL) 58.6 % packet Take 1 packet by mouth daily.    Marland Kitchen doxycycline (VIBRA-TABS) 100 MG tablet Take 1 tablet (100 mg total) by mouth 2 (two) times daily. 20 tablet 0  . fluticasone (FLONASE) 50 MCG/ACT nasal spray Place 2 sprays into both nostrils daily. 16 g 6   No current facility-administered medications for this visit.     Allergies: Aspirin; Black cohosh; and Promethazine hcl  Past Medical History:  Diagnosis Date  . Brain aneurysm   . DIVERTICULOSIS, COLON 06/05/2007   Qualifier: Diagnosis of  By: Jenny Reichmann MD, Hunt Oris   . GERD 06/05/2007   Qualifier: Diagnosis of  By: Elveria Royals   . GOITER 03/03/2008   Qualifier: Diagnosis of  By: Jenny Reichmann MD, Hunt Oris   . Hematuria    FOLLOWED BY UROLOGIST FOR THIS  . HYPERLIPIDEMIA 06/05/2007   Qualifier: Diagnosis of  By: Jenny Reichmann MD, Hunt Oris   . Osteopenia   . Vitamin D  deficiency     Past Surgical History:  Procedure Laterality Date  . NOSE SURGERY    . OOPHORECTOMY    . VAGINAL HYSTERECTOMY  1976   FOR STERILIZATION    Family History  Problem Relation Age of Onset  . Diabetes Mother   . Hypertension Mother   . Heart disease Mother   . Breast cancer Mother        Age 4  . Cancer Sister        MELANOMA  . Breast cancer Sister        Age 41  . Cancer Sister        Melanoma    Social History   Tobacco Use  . Smoking status: Current Every Day Smoker    Packs/day: 0.50    Types: Cigarettes  . Smokeless tobacco: Never Used  Substance Use Topics  . Alcohol use: Yes    Alcohol/week: 6.0 oz    Types: 10 Standard drinks or equivalent per week    Subjective:  Patient presents with 2 main concerns today:  1) Had swelling/ bloating/ LLQ abdominal pain last week; does have history of diverticulosis; notes she thinks it is time to schedule colonoscopy; denies any blood in stool; did use OTC Milk of Magnesia and got relief; per medical history, has had oophorectomy;  2) Also complaining of 2 month history of sinus  pressure/ cough/ congestion; denies any fever or shortness of breath or difficulty breathing; smokes at least 1/2 ppd; took 10 days of Augmentin in early October with no change in symptoms;   Objective:  Vitals:   01/24/17 0911  BP: 108/68  Pulse: 74  Temp: 97.9 F (36.6 C)  TempSrc: Oral  SpO2: 100%  Weight: 114 lb (51.7 kg)  Height: 5\' 3"  (1.6 m)    General: Well developed, well nourished, in no acute distress  Skin : Warm and dry.  Head: Normocephalic and atraumatic  Eyes: Sclera and conjunctiva clear; pupils round and reactive to light; extraocular movements intact  Ears: External normal; canals clear; tympanic membranes congested bilaterally  Oropharynx: Pink, supple. No suspicious lesions  Neck: Supple without thyromegaly, adenopathy  Lungs: Respirations unlabored; clear to auscultation bilaterally without wheeze,  rales, rhonchi  CVS exam: normal rate, regular rhythm,  Abdomen: Soft; nontender; nondistended; normoactive bowel sounds; no masses or hepatosplenomegaly  Neurologic: Alert and oriented; speech intact; face symmetrical; moves all extremities well; CNII-XII intact without focal deficit  Assessment:  1. Bowel habit changes   2. Cough present for greater than 3 weeks   3. Tobacco abuse     Plan:  1. ? If patient had recent diverticulitis flare; she is asymptomatic at this time so will hold imaging today; check CBC, CMP today;  will refer to GI for evaluation; if symptoms recur or she is unable to see GI in the next 1-2 weeks, she is to call back and will plan for abd/ pelvic CT; work on increasing fiber intake/ water; 2. & 3. Will treat for sinusitis/ bronchitis; Update CXR today; encouraged to quit smoking- patient notes she is not ready; Rx for Doxcycline 100 mg bid x 10 days and Flonase; follow-up worse, no better.   No Follow-up on file.  Orders Placed This Encounter  Procedures  . DG Chest 2 View    Standing Status:   Future    Standing Expiration Date:   03/26/2018    Order Specific Question:   Reason for Exam (SYMPTOM  OR DIAGNOSIS REQUIRED)    Answer:   Cough x 2 months; + smoker    Order Specific Question:   Preferred imaging location?    Answer:   [facility]    Order Specific Question:   Radiology Contrast Protocol - do NOT remove file path    Answer:   [email or web address]  . CBC    Standing Status:   Future    Standing Expiration Date:   01/24/2018  . Comprehensive metabolic panel    Standing Status:   Future    Standing Expiration Date:   01/24/2018  . Ambulatory referral to Gastroenterology    Referral Priority:   Routine    Referral Type:   Consultation    Referral Reason:   Specialty Services Required    Number of Visits Requested:   1    Requested Prescriptions   Signed Prescriptions Disp Refills  . doxycycline  (VIBRA-TABS) 100 MG tablet 20 tablet 0    Sig: Take 1 tablet (100 mg total) by mouth 2 (two) times daily.  . fluticasone (FLONASE) 50 MCG/ACT nasal spray 16 g 6    Sig: Place 2 sprays into both nostrils daily.

## 2017-01-26 ENCOUNTER — Other Ambulatory Visit: Payer: Self-pay | Admitting: Family

## 2017-01-26 DIAGNOSIS — R899 Unspecified abnormal finding in specimens from other organs, systems and tissues: Secondary | ICD-10-CM

## 2017-01-29 ENCOUNTER — Other Ambulatory Visit (INDEPENDENT_AMBULATORY_CARE_PROVIDER_SITE_OTHER): Payer: Medicare HMO

## 2017-01-29 ENCOUNTER — Other Ambulatory Visit: Payer: Self-pay | Admitting: Family

## 2017-01-29 DIAGNOSIS — R7989 Other specified abnormal findings of blood chemistry: Secondary | ICD-10-CM

## 2017-01-29 DIAGNOSIS — R899 Unspecified abnormal finding in specimens from other organs, systems and tissues: Secondary | ICD-10-CM | POA: Diagnosis not present

## 2017-01-29 LAB — CBC
HEMATOCRIT: 40.7 % (ref 36.0–46.0)
HEMOGLOBIN: 14 g/dL (ref 12.0–15.0)
MCHC: 34.4 g/dL (ref 30.0–36.0)
MCV: 97.1 fl (ref 78.0–100.0)
Platelets: 537 10*3/uL — ABNORMAL HIGH (ref 150.0–400.0)
RBC: 4.19 Mil/uL (ref 3.87–5.11)
RDW: 12.2 % (ref 11.5–15.5)
WBC: 7.2 10*3/uL (ref 4.0–10.5)

## 2017-01-29 LAB — GLUCOSE, RANDOM: Glucose, Bld: 87 mg/dL (ref 70–99)

## 2017-01-29 LAB — HEMOGLOBIN A1C: Hgb A1c MFr Bld: 5.8 % (ref 4.6–6.5)

## 2017-01-29 NOTE — Addendum Note (Signed)
Addended by: Kathlyn Sacramento on: 01/29/2017 08:10 AM   Modules accepted: Orders

## 2017-01-29 NOTE — Addendum Note (Signed)
Addended by: Kathlyn Sacramento on: 01/29/2017 08:09 AM   Modules accepted: Orders

## 2017-02-01 ENCOUNTER — Encounter: Payer: Self-pay | Admitting: Nurse Practitioner

## 2017-02-09 ENCOUNTER — Telehealth: Payer: Self-pay | Admitting: Hematology and Oncology

## 2017-02-09 NOTE — Telephone Encounter (Signed)
Spoke with patient and gave all info regarding appointment date/time/location & phone number.

## 2017-02-13 ENCOUNTER — Ambulatory Visit: Payer: Medicare HMO | Admitting: Nurse Practitioner

## 2017-02-13 ENCOUNTER — Encounter: Payer: Self-pay | Admitting: Nurse Practitioner

## 2017-02-13 VITALS — BP 130/70 | HR 68 | Ht 63.0 in | Wt 115.0 lb

## 2017-02-13 DIAGNOSIS — R1032 Left lower quadrant pain: Secondary | ICD-10-CM | POA: Diagnosis not present

## 2017-02-13 DIAGNOSIS — R1013 Epigastric pain: Secondary | ICD-10-CM | POA: Diagnosis not present

## 2017-02-13 NOTE — Patient Instructions (Signed)
If you are age 71 or older, your body mass index should be between 23-30. Your Body mass index is 20.37 kg/m. If this is out of the aforementioned range listed, please consider follow up with your Primary Care Provider.  If you are age 66 or younger, your body mass index should be between 19-25. Your Body mass index is 20.37 kg/m. If this is out of the aformentioned range listed, please consider follow up with your Primary Care Provider.   Call for recurrent symptoms.  Thank you for choosing me and [facility].   Tye Savoy, NP

## 2017-02-13 NOTE — Progress Notes (Signed)
Chief Complaint:  Abdominal pain / bowel changes  HPI:  Patient is a 71 year old female known remotely to Dr. Deatra Ina . Patient is referred by PCP, Jodi Mourning, FNP for bowel changes and abdominal pain. Approx 1.5 months ago patient developed intermittent LLQ discomfort lasting 4-5 days. Had a low grade fever but had a concurrent sinus infection. She takes Metamucil and usually has no problems with BM but volume of stool was less and abdomen became distended. Took MOM, had a large BM with resolution of pain and distention.   Over the last several months patient has been having sensation of pressure just below xiphoid process. Discomfort intermittent, non-radiating, not really related to eating. Discomfort worse in evening before laying down. No associated SOB or nausea. She takes Prevacid daily for hx of GERD. A few days ago she increased dose to BID and the discomfort has greatly improved. Of note, she took Doxycycline a month ago for sinus infection but was already having the discomfort well before then. No NSAID use. No GERD sx such as regurgitation or reflux on Prevacid.   Labs 11/28 : normal liver chemistries Abnormal platelets, sees Hematology soon. Platelets normal in 2017, now 537.    Past Medical History:  Diagnosis Date  . Brain aneurysm   . DIVERTICULOSIS, COLON 06/05/2007   Qualifier: Diagnosis of  By: Jenny Reichmann MD, Hunt Oris   . GERD 06/05/2007   Qualifier: Diagnosis of  By: Elveria Royals   . GOITER 03/03/2008   Qualifier: Diagnosis of  By: Jenny Reichmann MD, Hunt Oris   . Hematuria    FOLLOWED BY UROLOGIST FOR THIS  . HYPERLIPIDEMIA 06/05/2007   Qualifier: Diagnosis of  By: Jenny Reichmann MD, Hunt Oris   . Osteopenia   . Vitamin D deficiency      Past Surgical History:  Procedure Laterality Date  . NOSE SURGERY    . OOPHORECTOMY    . VAGINAL HYSTERECTOMY  1976   FOR STERILIZATION   Family History  Problem Relation Age of Onset  . Diabetes Mother   . Hypertension Mother   . Heart  disease Mother   . Breast cancer Mother        Age 85  . Cancer Sister        MELANOMA  . Breast cancer Sister        Age 33  . Cancer Sister        Melanoma   Social History   Tobacco Use  . Smoking status: Current Every Day Smoker    Packs/day: 0.50    Types: Cigarettes  . Smokeless tobacco: Never Used  Substance Use Topics  . Alcohol use: Yes    Alcohol/week: 6.0 oz    Types: 10 Standard drinks or equivalent per week  . Drug use: No   Current Outpatient Medications  Medication Sig Dispense Refill  . Biotin 1 MG CAPS Take 1 capsule by mouth daily.    Marland Kitchen estradiol (VIVELLE-DOT) 0.05 MG/24HR patch Place 1 patch (0.05 mg total) onto the skin 2 (two) times a week. 24 patch 4  . lansoprazole (PREVACID) 15 MG capsule TAKE 1 CAPSULE EVERY DAY 90 capsule 3  . Misc Natural Products (LUTEIN 20) CAPS Take 1 tablet by mouth daily.    . Multiple Vitamin (MULTIVITAMIN) tablet Take 1 tablet by mouth daily.    . psyllium (METAMUCIL) 58.6 % packet Take 1 packet by mouth daily.     No current facility-administered medications for this visit.    Allergies  Allergen Reactions  . Aspirin     REACTION: brain aneurysm  . Black Cohosh     REACTION: hives  . Promethazine Hcl     REACTION: rash     Review of Systems: All systems reviewed and negative except where noted in HPI.    Physical Exam: BP 130/70   Pulse 68   Ht 5\' 3"  (1.6 m)   Wt 115 lb (52.2 kg)   BMI 20.37 kg/m  Constitutional:  Well-developed, white female in no acute distress. Psychiatric: Normal mood and affect. Behavior is normal. EENT: Pupils normal.  Conjunctivae are normal. No scleral icterus. Neck supple.  Cardiovascular: Normal rate, regular rhythm. No edema Pulmonary/chest: Effort normal and breath sounds normal. No wheezing, rales or rhonchi. Abdominal: Soft, nondistended. Nontender. Bowel sounds active throughout. There are no masses palpable. No hepatomegaly.  Physical Exam  Abdominal:       Abdomen: Abdomen soft, nondistended, nontender. Normal bowel sounds. No hepatomegaly. No masses felt. Lymphadenopathy: No cervical adenopathy noted. Neurological: Alert and oriented to person place and time. Skin: Skin is warm and dry. No rashes noted.   ASSESSMENT AND PLAN:  25. 71 year old female with episode of LLQ pain and distention approx 1 month ago.  Pain and distention resolved after dose of milk of magnesia with resulting large bowel movement. Suspect her symptoms were secondary to constipation. Patient has been doing fine since, she typically does not have constipation problems -Continue daily Metamucil -Let us know if left lower quadrant pain recurs  2. Epigastric discomfort. Patient takes a PPI for GERD. Recently increased dose to twice daily with resolution of her symptoms. Symptoms sound peptic in nature especially given response to increasing the PPI. Patient has already returned to her once daily dosing of PPI.  -If she has recurrent upper abdominal pain patient will call the office at which time we will proceed with workup. Right now she looks fine, abdominal exam is unremarkable, white count is normal, hemoglobin and liver chemistries are normal  Tye Savoy, NP  02/13/2017, 9:54 AM  Cc: Sherlene Shams, FNP

## 2017-02-14 NOTE — Progress Notes (Signed)
Agree with assessment and plan. Last colonoscopy in 2011 without any significant findings. Sounds like both symptoms have improved. She can follow up as needed if symptoms recur.

## 2017-03-02 ENCOUNTER — Inpatient Hospital Stay: Payer: Medicare HMO | Attending: Hematology and Oncology | Admitting: Hematology and Oncology

## 2017-03-02 ENCOUNTER — Encounter: Payer: Self-pay | Admitting: Hematology and Oncology

## 2017-03-02 ENCOUNTER — Other Ambulatory Visit: Payer: Self-pay

## 2017-03-02 ENCOUNTER — Ambulatory Visit (HOSPITAL_BASED_OUTPATIENT_CLINIC_OR_DEPARTMENT_OTHER): Payer: Medicare HMO

## 2017-03-02 VITALS — BP 159/92 | HR 77 | Temp 98.3°F | Resp 18 | Wt 114.6 lb

## 2017-03-02 DIAGNOSIS — D75839 Thrombocytosis, unspecified: Secondary | ICD-10-CM

## 2017-03-02 DIAGNOSIS — M8580 Other specified disorders of bone density and structure, unspecified site: Secondary | ICD-10-CM | POA: Diagnosis not present

## 2017-03-02 DIAGNOSIS — D473 Essential (hemorrhagic) thrombocythemia: Secondary | ICD-10-CM

## 2017-03-02 DIAGNOSIS — Z803 Family history of malignant neoplasm of breast: Secondary | ICD-10-CM

## 2017-03-02 DIAGNOSIS — Z808 Family history of malignant neoplasm of other organs or systems: Secondary | ICD-10-CM | POA: Diagnosis not present

## 2017-03-02 DIAGNOSIS — Z7289 Other problems related to lifestyle: Secondary | ICD-10-CM

## 2017-03-02 DIAGNOSIS — Z720 Tobacco use: Secondary | ICD-10-CM

## 2017-03-02 LAB — CBC WITH DIFFERENTIAL/PLATELET
BASO%: 0.4 % (ref 0.0–2.0)
BASOS ABS: 0 10*3/uL (ref 0.0–0.1)
EOS%: 2.7 % (ref 0.0–7.0)
Eosinophils Absolute: 0.2 10*3/uL (ref 0.0–0.5)
HEMATOCRIT: 39.3 % (ref 34.8–46.6)
HGB: 13.3 g/dL (ref 11.6–15.9)
LYMPH%: 33.9 % (ref 14.0–49.7)
MCH: 32.5 pg (ref 25.1–34.0)
MCHC: 33.9 g/dL (ref 31.5–36.0)
MCV: 95.7 fL (ref 79.5–101.0)
MONO#: 0.5 10*3/uL (ref 0.1–0.9)
MONO%: 8 % (ref 0.0–14.0)
NEUT#: 3.6 10*3/uL (ref 1.5–6.5)
NEUT%: 55 % (ref 38.4–76.8)
Platelets: 275 10*3/uL (ref 145–400)
RBC: 4.11 10*6/uL (ref 3.70–5.45)
RDW: 12.9 % (ref 11.2–14.5)
WBC: 6.6 10*3/uL (ref 3.9–10.3)
lymph#: 2.2 10*3/uL (ref 0.9–3.3)

## 2017-03-12 DIAGNOSIS — D473 Essential (hemorrhagic) thrombocythemia: Secondary | ICD-10-CM | POA: Insufficient documentation

## 2017-03-12 DIAGNOSIS — D75839 Thrombocytosis, unspecified: Secondary | ICD-10-CM | POA: Insufficient documentation

## 2017-03-12 NOTE — Assessment & Plan Note (Signed)
72 y.o. who is an active smoker female referred to our evaluation for an isolated minimal thrombocytosis detected in early December concurrent with a viral infection.  Repeat platelet value today is back down to normal.  Most likely, elevated platelet count was a reactive change due to inflammatory state in the body due to viral infection.  Smokers also may have elevated platelet counts due to inflammatory changes introduced by tobacco abuse.  Plan: -- Return to care of the primary care provider with routine lab work based on other comorbidities --Return to hematology clinic as needed if new hematological abnormalities are discovered in the future.

## 2017-03-12 NOTE — Progress Notes (Signed)
[facility] New Visit:  Assessment: Thrombocytosis ([place]) 72 y.o. who is an active smoker female referred to our evaluation for an isolated minimal thrombocytosis detected in early December concurrent with a viral infection.  Repeat platelet value today is back down to normal.  Most likely, elevated platelet count was a reactive change due to inflammatory state in the body due to viral infection.  Smokers also may have elevated platelet counts due to inflammatory changes introduced by tobacco abuse.  Plan: -- Return to care of the primary care provider with routine lab work based on other comorbidities --Return to hematology clinic as needed if new hematological abnormalities are discovered in the future.  Voice recognition software was used and creation of this note. Despite my best effort at editing the text, some misspelling/errors may have occurred.  Orders Placed This Encounter  Procedures  . CBC with Differential    Standing Status:   Future    Number of Occurrences:   1    Standing Expiration Date:   03/02/2018    All questions were answered.  . The patient knows to call the clinic with any problems, questions or concerns.  This note was electronically signed.    History of Presenting Illness Isabel Christensen 72 y.o. presenting to the [facility] for elevated platelet count, referred by Sherlene Shams, FNP.  Please see hematological trends below for details.  Patient's past medical history significant for osteopenia, ongoing tobacco abuse, GERD, and history of colon diverticulosis.  Patient's family history is remarkable for mother with breast cancer at 39, sister #1 with melanoma, sister #2 with breast cancer at 87, and sister #3 with melanoma diagnosis.  Patient is current and active tobacco smoker with 0.5-1.0 packs/day, she also drinks approximately 10 alcoholic beverages per week.  At the time of abnormal platelet count in early December, patient reports  suffering from a viral infection with upper respiratory tract symptoms.  Symptoms have resolved completely since then.  Currently patient is feeling back to her baseline but no active complaints.  She denies any petechiae, swollen lymph nodes, fevers, chills, night sweats.  Energy level is excellent and she denies any nausea, vomiting, early satiety, abdominal pain.  Oncological/hematological History: --Labs, 03/03/12: WBC   9.5, Hgb 14.9, Plt 276 --Labs, 04/28/14: WBC   8.3, Hgb 13.8, Plt 302 --Labs, 05/12/15: WBC 11.4, Hgb 15.2, Plt 318 --Labs, 01/24/17: WBC   6.3, Hgb 13.6, Plt 473 --Labs, 01/29/17: WBC   7.2, Hgb 14.0, Plt 537  Medical History: Past Medical History:  Diagnosis Date  . Brain aneurysm   . DIVERTICULOSIS, COLON 06/05/2007   Qualifier: Diagnosis of  By: Jenny Reichmann MD, Hunt Oris   . GERD 06/05/2007   Qualifier: Diagnosis of  By: Elveria Royals   . GOITER 03/03/2008   Qualifier: Diagnosis of  By: Jenny Reichmann MD, Hunt Oris   . Hematuria    FOLLOWED BY UROLOGIST FOR THIS  . HYPERLIPIDEMIA 06/05/2007   Qualifier: Diagnosis of  By: Jenny Reichmann MD, Hunt Oris   . Osteopenia   . Vitamin D deficiency     Surgical History: Past Surgical History:  Procedure Laterality Date  . NOSE SURGERY    . OOPHORECTOMY    . VAGINAL HYSTERECTOMY  1976   FOR STERILIZATION    Family History: Family History  Problem Relation Age of Onset  . Diabetes Mother   . Hypertension Mother   . Heart disease Mother   . Breast cancer Mother  Age 24  . Cancer Sister        MELANOMA  . Breast cancer Sister        Age 78  . Cancer Sister        Melanoma    Social History: Social History   Socioeconomic History  . Marital status: Married    Spouse name: Not on file  . Number of children: Not on file  . Years of education: Not on file  . Highest education level: Not on file  Social Needs  . Financial resource strain: Not on file  . Food insecurity - worry: Not on file  . Food insecurity - inability:  Not on file  . Transportation needs - medical: Not on file  . Transportation needs - non-medical: Not on file  Occupational History  . Not on file  Tobacco Use  . Smoking status: Current Every Day Smoker    Packs/day: 0.50    Types: Cigarettes  . Smokeless tobacco: Never Used  Substance and Sexual Activity  . Alcohol use: Yes    Alcohol/week: 6.0 oz    Types: 10 Standard drinks or equivalent per week  . Drug use: No  . Sexual activity: Yes    Birth control/protection: Surgical  Other Topics Concern  . Not on file  Social History Narrative  . Not on file    Allergies: Allergies  Allergen Reactions  . Aspirin     REACTION: brain aneurysm  . Black Cohosh     REACTION: hives  . Promethazine Hcl     REACTION: rash    Medications:  Current Outpatient Medications  Medication Sig Dispense Refill  . Biotin 1 MG CAPS Take 1 capsule by mouth daily.    Marland Kitchen estradiol (VIVELLE-DOT) 0.05 MG/24HR patch Place 1 patch (0.05 mg total) onto the skin 2 (two) times a week. 24 patch 4  . lansoprazole (PREVACID) 15 MG capsule TAKE 1 CAPSULE EVERY DAY 90 capsule 3  . Misc Natural Products (LUTEIN 20) CAPS Take 1 tablet by mouth daily.    . Multiple Vitamin (MULTIVITAMIN) tablet Take 1 tablet by mouth daily.    . psyllium (METAMUCIL) 58.6 % packet Take 1 packet by mouth daily.     No current facility-administered medications for this visit.     Review of Systems: Review of Systems  All other systems reviewed and are negative.    PHYSICAL EXAMINATION Blood pressure (!) 159/92, pulse 77, temperature 98.3 F (36.8 C), temperature source Oral, resp. rate 18, weight 114 lb 9.6 oz (52 kg), SpO2 99 %.  ECOG PERFORMANCE STATUS: 0 - Asymptomatic  Physical Exam  Constitutional: She is oriented to person, place, and time and well-developed, well-nourished, and in no distress. No distress.  HENT:  Head: Normocephalic and atraumatic.  Mouth/Throat: Oropharynx is clear and moist. No  oropharyngeal exudate.  Eyes: Conjunctivae and EOM are normal. Pupils are equal, round, and reactive to light. No scleral icterus.  Neck: No thyromegaly present.  Cardiovascular: Normal rate, regular rhythm and normal heart sounds.  No murmur heard. Pulmonary/Chest: Effort normal and breath sounds normal. No respiratory distress. She has no wheezes. She has no rales.  Abdominal: Soft. Bowel sounds are normal. She exhibits no distension. There is no tenderness. There is no rebound.  Musculoskeletal: She exhibits no edema.  Lymphadenopathy:    She has no cervical adenopathy.  Neurological: She is alert and oriented to person, place, and time. She has normal reflexes. No cranial nerve deficit.  Skin: Skin  is warm and dry. No rash noted. She is not diaphoretic. No erythema.     LABORATORY DATA: I have personally reviewed the data as listed: Appointment on 03/02/2017  Component Date Value Ref Range Status  . WBC 03/02/2017 6.6  3.9 - 10.3 10e3/uL Final  . NEUT# 03/02/2017 3.6  1.5 - 6.5 10e3/uL Final  . HGB 03/02/2017 13.3  11.6 - 15.9 g/dL Final  . HCT 03/02/2017 39.3  34.8 - 46.6 % Final  . Platelets 03/02/2017 275  145 - 400 10e3/uL Final  . MCV 03/02/2017 95.7  79.5 - 101.0 fL Final  . MCH 03/02/2017 32.5  25.1 - 34.0 pg Final  . MCHC 03/02/2017 33.9  31.5 - 36.0 g/dL Final  . RBC 03/02/2017 4.11  3.70 - 5.45 10e6/uL Final  . RDW 03/02/2017 12.9  11.2 - 14.5 % Final  . lymph# 03/02/2017 2.2  0.9 - 3.3 10e3/uL Final  . MONO# 03/02/2017 0.5  0.1 - 0.9 10e3/uL Final  . Eosinophils Absolute 03/02/2017 0.2  0.0 - 0.5 10e3/uL Final  . Basophils Absolute 03/02/2017 0.0  0.0 - 0.1 10e3/uL Final  . NEUT% 03/02/2017 55.0  38.4 - 76.8 % Final  . LYMPH% 03/02/2017 33.9  14.0 - 49.7 % Final  . MONO% 03/02/2017 8.0  0.0 - 14.0 % Final  . EOS% 03/02/2017 2.7  0.0 - 7.0 % Final  . BASO% 03/02/2017 0.4  0.0 - 2.0 % Final         Ardath Sax, MD

## 2017-04-16 ENCOUNTER — Encounter: Payer: Medicare HMO | Admitting: Gynecology

## 2017-04-18 ENCOUNTER — Encounter: Payer: Medicare HMO | Admitting: Gynecology

## 2017-04-19 DIAGNOSIS — D1801 Hemangioma of skin and subcutaneous tissue: Secondary | ICD-10-CM | POA: Diagnosis not present

## 2017-04-19 DIAGNOSIS — L84 Corns and callosities: Secondary | ICD-10-CM | POA: Diagnosis not present

## 2017-04-19 DIAGNOSIS — L821 Other seborrheic keratosis: Secondary | ICD-10-CM | POA: Diagnosis not present

## 2017-04-19 DIAGNOSIS — L812 Freckles: Secondary | ICD-10-CM | POA: Diagnosis not present

## 2017-04-19 DIAGNOSIS — Z85828 Personal history of other malignant neoplasm of skin: Secondary | ICD-10-CM | POA: Diagnosis not present

## 2017-04-19 DIAGNOSIS — I788 Other diseases of capillaries: Secondary | ICD-10-CM | POA: Diagnosis not present

## 2017-04-25 ENCOUNTER — Encounter: Payer: Self-pay | Admitting: Gynecology

## 2017-04-25 ENCOUNTER — Ambulatory Visit: Payer: Medicare HMO | Admitting: Gynecology

## 2017-04-25 VITALS — BP 122/78 | Ht 63.0 in | Wt 114.0 lb

## 2017-04-25 DIAGNOSIS — Z01411 Encounter for gynecological examination (general) (routine) with abnormal findings: Secondary | ICD-10-CM | POA: Diagnosis not present

## 2017-04-25 DIAGNOSIS — M8580 Other specified disorders of bone density and structure, unspecified site: Secondary | ICD-10-CM

## 2017-04-25 DIAGNOSIS — Z79890 Hormone replacement therapy (postmenopausal): Secondary | ICD-10-CM

## 2017-04-25 DIAGNOSIS — N952 Postmenopausal atrophic vaginitis: Secondary | ICD-10-CM

## 2017-04-25 MED ORDER — ESTRADIOL 0.05 MG/24HR TD PTTW: 1.0000 | MEDICATED_PATCH | TRANSDERMAL | 4 refills | Status: DC

## 2017-04-25 NOTE — Patient Instructions (Signed)
Follow-up for the bone density as scheduled. 

## 2017-04-25 NOTE — Progress Notes (Signed)
    Isabel Christensen 06/13/1945 [id number]        72 y.o.  G1P0010 for breast and pelvic exam.  Former patient of Dr. Toney Rakes.  Several issues noted below.  Past medical history,surgical history, problem list, medications, allergies, family history and social history were all reviewed and documented as reviewed in the EPIC chart.  ROS:  Performed with pertinent positives and negatives included in the history, assessment and plan.   Additional significant findings : None   Exam: Caryn Bee assistant Vitals:   04/25/17 1219  BP: 122/78  Weight: 114 lb (51.7 kg)  Height: 5\' 3"  (1.6 m)   Body mass index is 20.19 kg/m.  General appearance:  Normal affect, orientation and appearance. Skin: Grossly normal HEENT: Without gross lesions.  No cervical or supraclavicular adenopathy. Thyroid normal.  Lungs:  Clear without wheezing, rales or rhonchi Cardiac: RR, without RMG Abdominal:  Soft, nontender, without masses, guarding, rebound, organomegaly or hernia Breasts:  Examined lying and sitting without masses, retractions, discharge or axillary adenopathy. Pelvic:  Ext, BUS, Vagina: With atrophic changes  Adnexa: Without masses or tenderness    Anus and perineum: Normal   Rectovaginal: Normal sphincter tone without palpated masses or tenderness.    Assessment/Plan:  72 y.o. G33P0010 female for breast and pelvic exam status post TVH BSO in the past.   1. Osteopenia.  DEXA 02/2014 T score -1.4 FRAX 6.7% at the hip.  Had discussed treatment options with Dr. Toney Rakes 2018 and was to start Prolia but never followed up for this.  I rediscussed the issues of increased fracture risk particularly given her smoking history and thin Caucasian status.  Recommended baseline DEXA now for most current information and then we will rediscuss her treatment options.  Patient agrees with the plan and will schedule her bone density. 2. Postmenopausal/atrophic genital changes/HRT.  Patient is on estradiol 0.05 mg  patch.  Has hot flashes sweats and mood instability when stopping.  I reviewed the risks of the patch to include a realistic discussion of thrombosis given her smoking history.  Stroke heart attack DVT reviewed.  The breast cancer issue with HRT was also discussed.  At this point the patient wants to continue and I refilled her times 1 year. 3. Pap smear 2012.  No Pap smear done today.  Patient reports no history of abnormal Pap smears.  We both agree to stop screening based on current guidelines and her history of hysterectomy and age. 4. Colonoscopy 2011.  Repeat at their recommended interval. 5. Mammography due now on patient knows to schedule.  Breast exam normal today. 6. Health maintenance.  No routine lab work done as patient reports this done elsewhere.  Follow-up for bone density.  Follow-up in 1 year for annual exam.   Anastasio Auerbach MD, 12:53 PM 04/25/2017

## 2017-05-22 ENCOUNTER — Telehealth: Payer: Self-pay | Admitting: *Deleted

## 2017-05-22 NOTE — Telephone Encounter (Signed)
Prior authorization done via cover my meds for estradiol patch 0.05 mg, will wait for response.

## 2017-05-22 NOTE — Telephone Encounter (Signed)
Patch approved until 02/26/18, pharmacy informed as well.

## 2017-11-19 DIAGNOSIS — Z1231 Encounter for screening mammogram for malignant neoplasm of breast: Secondary | ICD-10-CM | POA: Diagnosis not present

## 2017-12-11 DIAGNOSIS — H43822 Vitreomacular adhesion, left eye: Secondary | ICD-10-CM | POA: Diagnosis not present

## 2017-12-11 DIAGNOSIS — H353131 Nonexudative age-related macular degeneration, bilateral, early dry stage: Secondary | ICD-10-CM | POA: Diagnosis not present

## 2017-12-11 DIAGNOSIS — H43813 Vitreous degeneration, bilateral: Secondary | ICD-10-CM | POA: Diagnosis not present

## 2017-12-11 DIAGNOSIS — H2513 Age-related nuclear cataract, bilateral: Secondary | ICD-10-CM | POA: Diagnosis not present

## 2018-01-02 DIAGNOSIS — M5136 Other intervertebral disc degeneration, lumbar region: Secondary | ICD-10-CM | POA: Diagnosis not present

## 2018-01-02 DIAGNOSIS — M25551 Pain in right hip: Secondary | ICD-10-CM | POA: Diagnosis not present

## 2018-04-19 DIAGNOSIS — Z85828 Personal history of other malignant neoplasm of skin: Secondary | ICD-10-CM | POA: Diagnosis not present

## 2018-04-19 DIAGNOSIS — I788 Other diseases of capillaries: Secondary | ICD-10-CM | POA: Diagnosis not present

## 2018-04-19 DIAGNOSIS — D225 Melanocytic nevi of trunk: Secondary | ICD-10-CM | POA: Diagnosis not present

## 2018-04-19 DIAGNOSIS — L218 Other seborrheic dermatitis: Secondary | ICD-10-CM | POA: Diagnosis not present

## 2018-04-19 DIAGNOSIS — D2271 Melanocytic nevi of right lower limb, including hip: Secondary | ICD-10-CM | POA: Diagnosis not present

## 2018-04-19 DIAGNOSIS — L820 Inflamed seborrheic keratosis: Secondary | ICD-10-CM | POA: Diagnosis not present

## 2018-04-19 DIAGNOSIS — D1801 Hemangioma of skin and subcutaneous tissue: Secondary | ICD-10-CM | POA: Diagnosis not present

## 2018-04-19 DIAGNOSIS — L821 Other seborrheic keratosis: Secondary | ICD-10-CM | POA: Diagnosis not present

## 2018-04-26 ENCOUNTER — Ambulatory Visit: Payer: Medicare HMO | Admitting: Gynecology

## 2018-04-26 ENCOUNTER — Encounter: Payer: Self-pay | Admitting: Gynecology

## 2018-04-26 VITALS — BP 124/78 | Ht 63.0 in | Wt 115.0 lb

## 2018-04-26 DIAGNOSIS — N952 Postmenopausal atrophic vaginitis: Secondary | ICD-10-CM | POA: Diagnosis not present

## 2018-04-26 DIAGNOSIS — Z79890 Hormone replacement therapy (postmenopausal): Secondary | ICD-10-CM | POA: Diagnosis not present

## 2018-04-26 DIAGNOSIS — Z01419 Encounter for gynecological examination (general) (routine) without abnormal findings: Secondary | ICD-10-CM | POA: Diagnosis not present

## 2018-04-26 DIAGNOSIS — M8580 Other specified disorders of bone density and structure, unspecified site: Secondary | ICD-10-CM | POA: Diagnosis not present

## 2018-04-26 MED ORDER — ESTRADIOL 0.05 MG/24HR TD PTTW: 1.0000 | MEDICATED_PATCH | TRANSDERMAL | 4 refills | Status: DC

## 2018-04-26 NOTE — Patient Instructions (Signed)
Schedule and follow-up for the bone density

## 2018-04-26 NOTE — Progress Notes (Signed)
    Jaymarie Yeakel April 03, 1945 [id number]        73 y.o.  G1P0010 for breast and pelvic exam.  Without gynecologic complaints  Past medical history,surgical history, problem list, medications, allergies, family history and social history were all reviewed and documented as reviewed in the EPIC chart.  ROS:  Performed with pertinent positives and negatives included in the history, assessment and plan.   Additional significant findings : None   Exam: Caryn Bee assistant Vitals:   04/26/18 1115  BP: 124/78  Weight: 115 lb (52.2 kg)  Height: 5\' 3"  (1.6 m)   Body mass index is 20.37 kg/m.  General appearance:  Normal affect, orientation and appearance. Skin: Grossly normal HEENT: Without gross lesions.  No cervical or supraclavicular adenopathy. Thyroid normal.  Lungs:  Clear without wheezing, rales or rhonchi Cardiac: RR, without RMG Abdominal:  Soft, nontender, without masses, guarding, rebound, organomegaly or hernia Breasts:  Examined lying and sitting without masses, retractions, discharge or axillary adenopathy. Pelvic:  Ext, BUS, Vagina: With atrophic changes  Adnexa: Without masses or tenderness    Anus and perineum: Normal   Rectovaginal: Normal sphincter tone without palpated masses or tenderness.    Assessment/Plan:  73 y.o. G21P0010 female for breast and pelvic exam.  Status post TVH BSO in the past  1. Postmenopausal/atrophic genital changes/HRT.  Continues on 0.05 mg estradiol patch.  We again discussed the risks versus benefits to include realistic risk of thrombosis such as stroke heart attack DVT in the breast cancer issue.  Benefits to include symptom relief, bone health and possible cardiovascular when started and continued early.  Patient strongly wants to continue on her estradiol.  Feels better on this and feels it is a quality of life issue.  She has a realistic understanding of her risks.  Refill x1 year provided. 2. Osteopenia.  DEXA 2016 T score -1.4 FRAX  6.7% at the hip.  Had discussed treatment options and patient had declined treatment other than HRT.  Had plan on DEXA last year but failed to follow-up for this.  I again strongly recommended patient schedule her DEXA.  She is at risk given her thin status Caucasian and smoking.  Patient agrees to schedule. 3. Pap smear 2012.  No Pap smear done today.  No history of significant abnormal Pap smears.  We both agree to stop screening per current screening guidelines. 4. Colonoscopy 2011 with reported repeat interval 10 years. 5. Mammography 10/2017.  Continue with annual mammography when due.  Breast exam normal today. 6. Health maintenance.  No routine lab work done as patient does this elsewhere.  Follow-up for bone density otherwise follow-up in 1 year, sooner as needed.   Anastasio Auerbach MD, 11:40 AM 04/26/2018

## 2018-04-28 DIAGNOSIS — M8580 Other specified disorders of bone density and structure, unspecified site: Secondary | ICD-10-CM

## 2018-04-28 HISTORY — DX: Other specified disorders of bone density and structure, unspecified site: M85.80

## 2018-04-29 DIAGNOSIS — H43822 Vitreomacular adhesion, left eye: Secondary | ICD-10-CM | POA: Diagnosis not present

## 2018-04-29 DIAGNOSIS — H353131 Nonexudative age-related macular degeneration, bilateral, early dry stage: Secondary | ICD-10-CM | POA: Diagnosis not present

## 2018-04-29 DIAGNOSIS — H2513 Age-related nuclear cataract, bilateral: Secondary | ICD-10-CM | POA: Diagnosis not present

## 2018-04-29 DIAGNOSIS — H43813 Vitreous degeneration, bilateral: Secondary | ICD-10-CM | POA: Diagnosis not present

## 2018-05-01 ENCOUNTER — Other Ambulatory Visit: Payer: Self-pay | Admitting: Gynecology

## 2018-05-01 ENCOUNTER — Encounter: Payer: Self-pay | Admitting: Gynecology

## 2018-05-01 ENCOUNTER — Ambulatory Visit (INDEPENDENT_AMBULATORY_CARE_PROVIDER_SITE_OTHER): Payer: Medicare HMO

## 2018-05-01 DIAGNOSIS — M85852 Other specified disorders of bone density and structure, left thigh: Secondary | ICD-10-CM

## 2018-05-01 DIAGNOSIS — Z780 Asymptomatic menopausal state: Secondary | ICD-10-CM

## 2018-05-01 DIAGNOSIS — M8580 Other specified disorders of bone density and structure, unspecified site: Secondary | ICD-10-CM

## 2018-05-01 DIAGNOSIS — M8588 Other specified disorders of bone density and structure, other site: Secondary | ICD-10-CM

## 2018-07-23 DIAGNOSIS — R69 Illness, unspecified: Secondary | ICD-10-CM | POA: Diagnosis not present

## 2018-07-29 DIAGNOSIS — H2512 Age-related nuclear cataract, left eye: Secondary | ICD-10-CM | POA: Diagnosis not present

## 2018-08-13 DIAGNOSIS — H25812 Combined forms of age-related cataract, left eye: Secondary | ICD-10-CM | POA: Diagnosis not present

## 2018-08-13 DIAGNOSIS — H2512 Age-related nuclear cataract, left eye: Secondary | ICD-10-CM | POA: Diagnosis not present

## 2018-09-23 IMAGING — DX DG CHEST 2V
2 series · 2 of 2 positions shown · non-contrast
Comparison: 03/03/2012

CLINICAL DATA: Cough, congestion, wheezing

EXAM:
CHEST  2 VIEW

[chest pa]
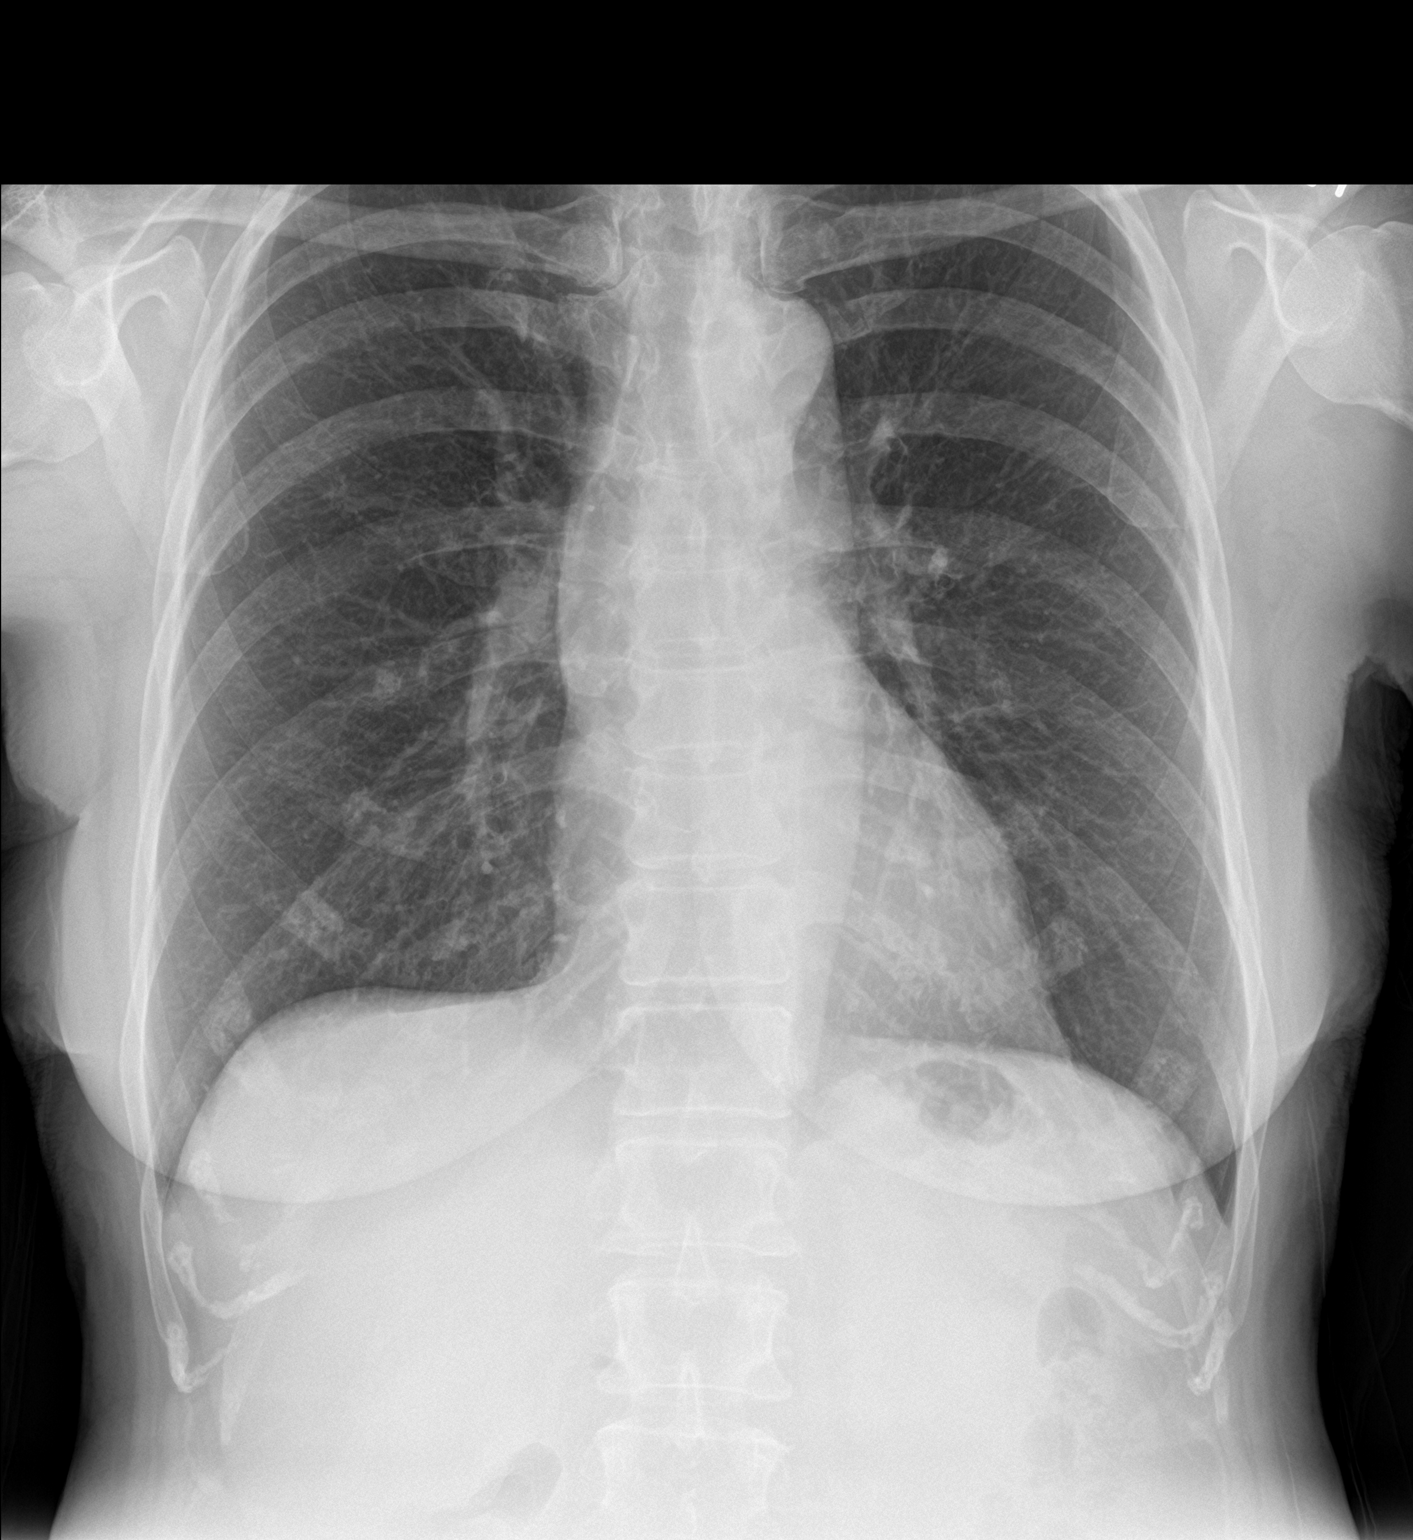

[chest lat]
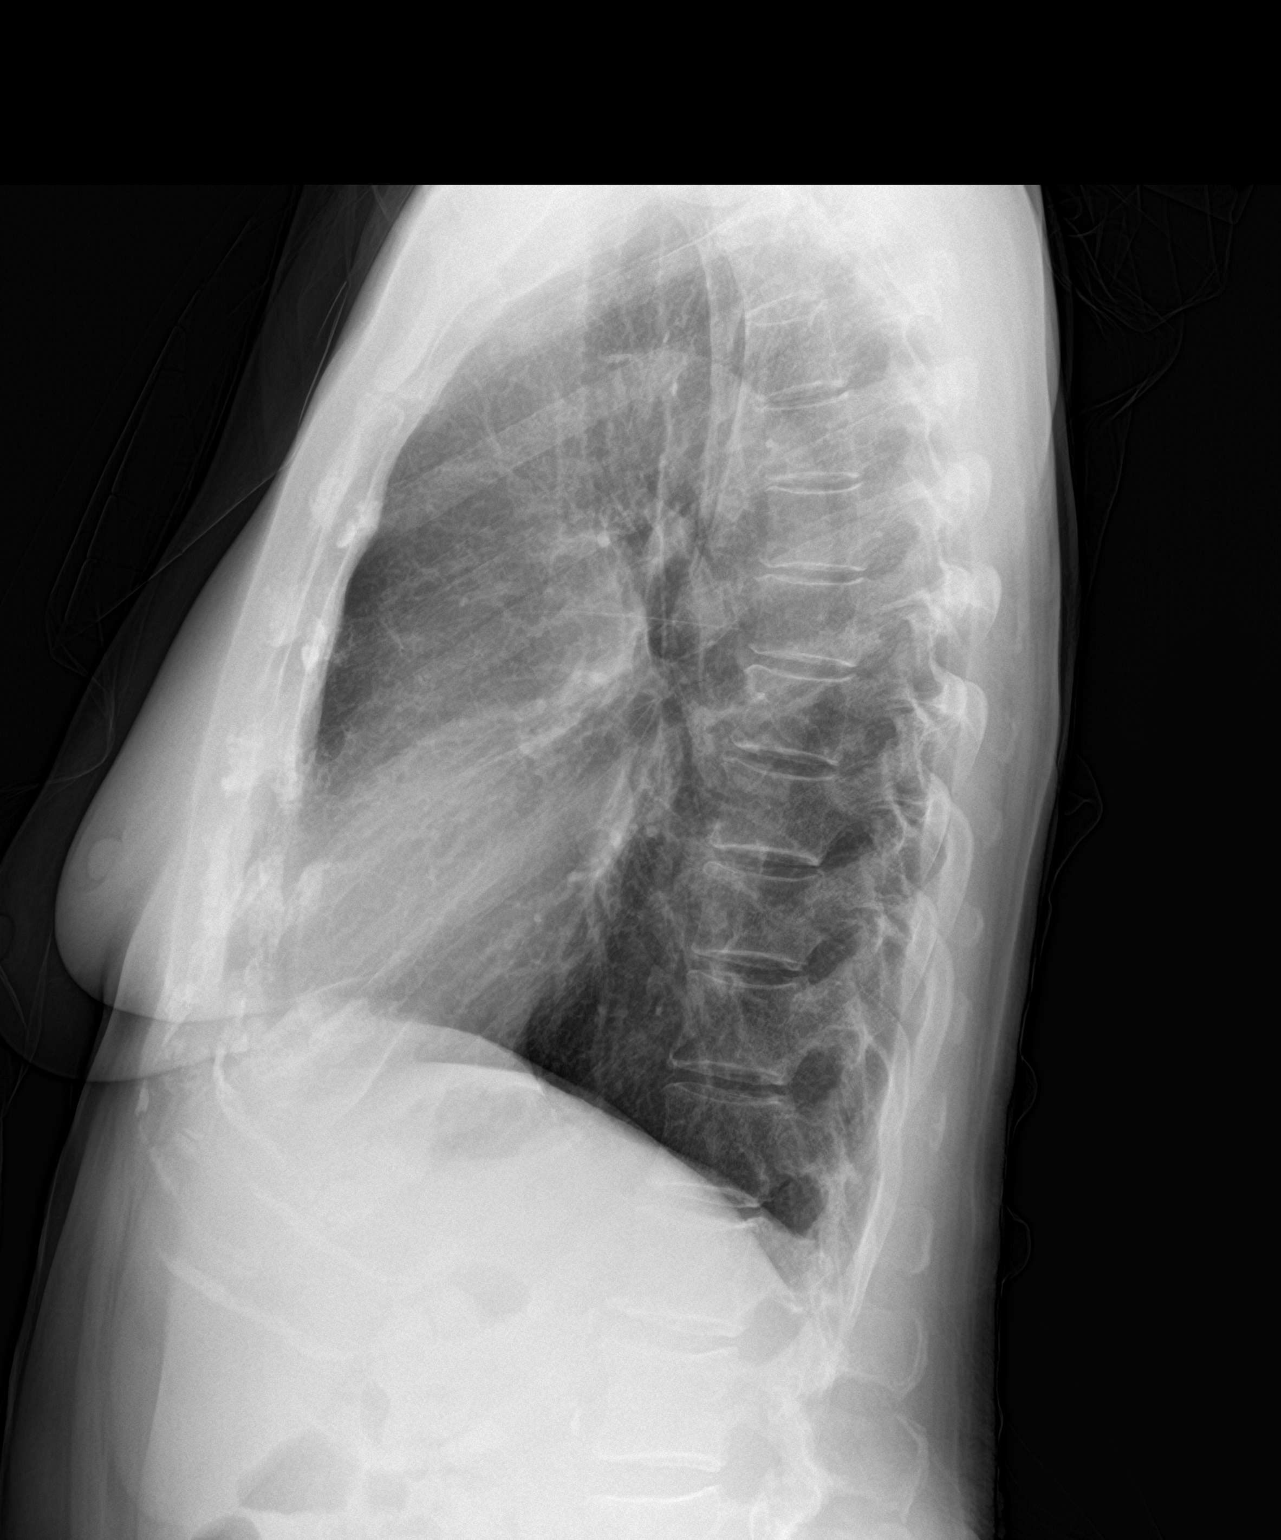

[2 of 2 positions shown; findings below may reference images not displayed]

FINDINGS: Heart and mediastinal contours are within normal limits. No focal
opacities or effusions. No acute bony abnormality.
IMPRESSION: No active cardiopulmonary disease.

## 2018-11-21 DIAGNOSIS — Z1231 Encounter for screening mammogram for malignant neoplasm of breast: Secondary | ICD-10-CM | POA: Diagnosis not present

## 2018-11-25 DIAGNOSIS — H2511 Age-related nuclear cataract, right eye: Secondary | ICD-10-CM | POA: Diagnosis not present

## 2018-12-03 DIAGNOSIS — H2511 Age-related nuclear cataract, right eye: Secondary | ICD-10-CM | POA: Diagnosis not present

## 2018-12-03 DIAGNOSIS — H25811 Combined forms of age-related cataract, right eye: Secondary | ICD-10-CM | POA: Diagnosis not present

## 2018-12-04 ENCOUNTER — Encounter: Payer: Self-pay | Admitting: Gynecology

## 2019-03-11 DIAGNOSIS — R69 Illness, unspecified: Secondary | ICD-10-CM | POA: Diagnosis not present

## 2019-04-22 DIAGNOSIS — D485 Neoplasm of uncertain behavior of skin: Secondary | ICD-10-CM | POA: Diagnosis not present

## 2019-04-22 DIAGNOSIS — L570 Actinic keratosis: Secondary | ICD-10-CM | POA: Diagnosis not present

## 2019-04-22 DIAGNOSIS — D1801 Hemangioma of skin and subcutaneous tissue: Secondary | ICD-10-CM | POA: Diagnosis not present

## 2019-04-22 DIAGNOSIS — D225 Melanocytic nevi of trunk: Secondary | ICD-10-CM | POA: Diagnosis not present

## 2019-04-22 DIAGNOSIS — D0472 Carcinoma in situ of skin of left lower limb, including hip: Secondary | ICD-10-CM | POA: Diagnosis not present

## 2019-04-22 DIAGNOSIS — I788 Other diseases of capillaries: Secondary | ICD-10-CM | POA: Diagnosis not present

## 2019-04-22 DIAGNOSIS — L821 Other seborrheic keratosis: Secondary | ICD-10-CM | POA: Diagnosis not present

## 2019-04-22 DIAGNOSIS — Z85828 Personal history of other malignant neoplasm of skin: Secondary | ICD-10-CM | POA: Diagnosis not present

## 2019-04-28 ENCOUNTER — Other Ambulatory Visit: Payer: Self-pay

## 2019-04-28 ENCOUNTER — Ambulatory Visit (INDEPENDENT_AMBULATORY_CARE_PROVIDER_SITE_OTHER): Payer: Medicare HMO | Admitting: Obstetrics and Gynecology

## 2019-04-28 ENCOUNTER — Encounter: Payer: Self-pay | Admitting: Obstetrics and Gynecology

## 2019-04-28 VITALS — BP 122/76 | Ht 64.0 in | Wt 113.0 lb

## 2019-04-28 DIAGNOSIS — Z01419 Encounter for gynecological examination (general) (routine) without abnormal findings: Secondary | ICD-10-CM

## 2019-04-28 DIAGNOSIS — M8580 Other specified disorders of bone density and structure, unspecified site: Secondary | ICD-10-CM

## 2019-04-28 DIAGNOSIS — Z79890 Hormone replacement therapy (postmenopausal): Secondary | ICD-10-CM

## 2019-04-28 MED ORDER — ESTRADIOL 0.05 MG/24HR TD PTTW: 1.0000 | MEDICATED_PATCH | TRANSDERMAL | 4 refills | Status: DC

## 2019-04-28 NOTE — Progress Notes (Signed)
Isabel Christensen 1945-05-16 [id number]  SUBJECTIVE:  74 y.o. G1P0010 female for annual routine gynecologic exam. She has no gynecologic concerns.  Current Outpatient Medications  Medication Sig Dispense Refill  . estradiol (VIVELLE-DOT) 0.05 MG/24HR patch Place 1 patch (0.05 mg total) onto the skin 2 (two) times a week. 24 patch 4  . lansoprazole (PREVACID) 15 MG capsule TAKE 1 CAPSULE EVERY DAY 90 capsule 3  . Misc Natural Products (LUTEIN 20) CAPS Take 1 tablet by mouth daily.    . Multiple Vitamin (MULTIVITAMIN) tablet Take 1 tablet by mouth daily.    . psyllium (METAMUCIL) 58.6 % packet Take 1 packet by mouth daily.     No current facility-administered medications for this visit.   Allergies: Aspirin, Black cohosh, and Promethazine hcl  No LMP recorded. Patient has had a hysterectomy.  Past medical history,surgical history, problem list, medications, allergies, family history and social history were all reviewed and documented as reviewed in the EPIC chart.  ROS:  Feeling well. No dyspnea or chest pain on exertion.  No abdominal pain, change in bowel habits, black or bloody stools.  No urinary tract symptoms. GYN ROS: no abnormal bleeding, pelvic pain or discharge, no breast pain or new or enlarging lumps on self exam. No neurological complaints.  OBJECTIVE:  BP 122/76   Ht 5\' 4"  (1.626 m)   Wt 113 lb (51.3 kg)   BMI 19.40 kg/m  The patient appears well, alert, oriented x 3, in no distress. ENT normal.  Neck supple. No cervical or supraclavicular adenopathy or thyromegaly.  Lungs are clear, good air entry, no wheezes, rhonchi or rales. S1 and S2 normal, no murmurs, regular rate and rhythm.  Abdomen soft without tenderness, guarding, mass or organomegaly.  Neurological is normal, no focal findings.  BREAST EXAM: breasts appear normal, no suspicious masses, no skin or nipple changes or axillary nodes  PELVIC EXAM: VULVA: normal appearing vulva with no masses, tenderness or  lesions, VAGINA: normal appearing vagina with normal color and discharge, no lesions, CERVIX: surgically absent, UTERUS: surgically absent, vaginal cuff well healed, ADNEXA: normal adnexa in size, nontender and no masses, no masses, RECTAL: normal rectal, no masses  Chaperone: Caryn Bee present during the examination  ASSESSMENT:  74 y.o. G1P0010 here for annual gynecologic exam  PLAN:   1. Postmenopausal/HRT.  Continues on 0.05 mg estradiol patch.  Refill provided for 1 year.  Prior TVH BSO.  Risks of estrogen use including thrombotic diseases such as heart attack, stroke, DVT, PE, increased risk of breast cancer are reviewed.  Due to a quality of life issue when she has not used the estrogen in the past, she would like to continue understanding these risks. 2. Pap smear 04/2010.  No significant history of abnormal Pap smears.  He is comfortable no longer screening given previous hysterectomy and age per the guidelines. 3. Mammogram 10/2018.  Normal breast exam today.  She is reminded to schedule an annual mammogram this year when due. 4. Colonoscopy 2011.  Recommended that she follow up at the recommended interval as she is probably about due now.  She will plan to get this scheduled 5. Osteopenia.  DEXA 04/2018.  Stable bone density.  T-score 0.6 at left femoral neck, FRAX 9.5% / 2.6%, other sites normal range.  Next DEXA recommended 2022 so she plans to schedule this when due.    6. Health maintenance.  No labs today as she normally has these completed with her primary care provider.   Return  annually or sooner, prn.  Joseph Pierini MD  04/28/19

## 2019-06-12 ENCOUNTER — Encounter: Payer: Self-pay | Admitting: Gastroenterology

## 2019-07-01 ENCOUNTER — Other Ambulatory Visit: Payer: Self-pay

## 2019-07-01 MED ORDER — ESTRADIOL 0.05 MG/24HR TD PTTW: 1.0000 | MEDICATED_PATCH | TRANSDERMAL | 4 refills | Status: DC

## 2019-07-09 DIAGNOSIS — H43813 Vitreous degeneration, bilateral: Secondary | ICD-10-CM | POA: Diagnosis not present

## 2019-07-09 DIAGNOSIS — Z961 Presence of intraocular lens: Secondary | ICD-10-CM | POA: Diagnosis not present

## 2019-07-09 DIAGNOSIS — H43822 Vitreomacular adhesion, left eye: Secondary | ICD-10-CM | POA: Diagnosis not present

## 2019-07-09 DIAGNOSIS — H353131 Nonexudative age-related macular degeneration, bilateral, early dry stage: Secondary | ICD-10-CM | POA: Diagnosis not present

## 2019-07-10 ENCOUNTER — Telehealth: Payer: Self-pay | Admitting: Internal Medicine

## 2019-07-10 NOTE — Telephone Encounter (Signed)
Ok with me 

## 2019-07-10 NOTE — Telephone Encounter (Signed)
   Are you willing to see patient? Last seen 2017.  Patient needing a physical

## 2019-07-18 ENCOUNTER — Ambulatory Visit (INDEPENDENT_AMBULATORY_CARE_PROVIDER_SITE_OTHER): Payer: Medicare HMO | Admitting: Internal Medicine

## 2019-07-18 ENCOUNTER — Encounter: Payer: Self-pay | Admitting: Internal Medicine

## 2019-07-18 ENCOUNTER — Other Ambulatory Visit: Payer: Self-pay

## 2019-07-18 ENCOUNTER — Other Ambulatory Visit: Payer: Self-pay | Admitting: Internal Medicine

## 2019-07-18 VITALS — BP 142/80 | HR 68 | Temp 98.3°F | Ht 64.0 in | Wt 111.0 lb

## 2019-07-18 DIAGNOSIS — I10 Essential (primary) hypertension: Secondary | ICD-10-CM

## 2019-07-18 DIAGNOSIS — Z0001 Encounter for general adult medical examination with abnormal findings: Secondary | ICD-10-CM | POA: Diagnosis not present

## 2019-07-18 DIAGNOSIS — M51369 Other intervertebral disc degeneration, lumbar region without mention of lumbar back pain or lower extremity pain: Secondary | ICD-10-CM | POA: Insufficient documentation

## 2019-07-18 DIAGNOSIS — E785 Hyperlipidemia, unspecified: Secondary | ICD-10-CM

## 2019-07-18 DIAGNOSIS — Z8639 Personal history of other endocrine, nutritional and metabolic disease: Secondary | ICD-10-CM | POA: Diagnosis not present

## 2019-07-18 DIAGNOSIS — E538 Deficiency of other specified B group vitamins: Secondary | ICD-10-CM | POA: Diagnosis not present

## 2019-07-18 DIAGNOSIS — E559 Vitamin D deficiency, unspecified: Secondary | ICD-10-CM | POA: Diagnosis not present

## 2019-07-18 DIAGNOSIS — Z0000 Encounter for general adult medical examination without abnormal findings: Secondary | ICD-10-CM

## 2019-07-18 DIAGNOSIS — Z1159 Encounter for screening for other viral diseases: Secondary | ICD-10-CM

## 2019-07-18 DIAGNOSIS — F17200 Nicotine dependence, unspecified, uncomplicated: Secondary | ICD-10-CM

## 2019-07-18 DIAGNOSIS — R69 Illness, unspecified: Secondary | ICD-10-CM | POA: Diagnosis not present

## 2019-07-18 DIAGNOSIS — M2550 Pain in unspecified joint: Secondary | ICD-10-CM

## 2019-07-18 DIAGNOSIS — M5136 Other intervertebral disc degeneration, lumbar region: Secondary | ICD-10-CM | POA: Insufficient documentation

## 2019-07-18 LAB — LDL CHOLESTEROL, DIRECT: Direct LDL: 126 mg/dL

## 2019-07-18 LAB — CBC WITH DIFFERENTIAL/PLATELET
Basophils Absolute: 0.1 10*3/uL (ref 0.0–0.1)
Basophils Relative: 0.8 % (ref 0.0–3.0)
Eosinophils Absolute: 0.1 10*3/uL (ref 0.0–0.7)
Eosinophils Relative: 1.4 % (ref 0.0–5.0)
HCT: 40.7 % (ref 36.0–46.0)
Hemoglobin: 14.1 g/dL (ref 12.0–15.0)
Lymphocytes Relative: 26.7 % (ref 12.0–46.0)
Lymphs Abs: 2.6 10*3/uL (ref 0.7–4.0)
MCHC: 34.6 g/dL (ref 30.0–36.0)
MCV: 97.5 fl (ref 78.0–100.0)
Monocytes Absolute: 0.6 10*3/uL (ref 0.1–1.0)
Monocytes Relative: 6.1 % (ref 3.0–12.0)
Neutro Abs: 6.3 10*3/uL (ref 1.4–7.7)
Neutrophils Relative %: 65 % (ref 43.0–77.0)
Platelets: 288 10*3/uL (ref 150.0–400.0)
RBC: 4.17 Mil/uL (ref 3.87–5.11)
RDW: 12.6 % (ref 11.5–15.5)
WBC: 9.8 10*3/uL (ref 4.0–10.5)

## 2019-07-18 LAB — URINALYSIS, ROUTINE W REFLEX MICROSCOPIC
Bilirubin Urine: NEGATIVE
Leukocytes,Ua: NEGATIVE
Nitrite: NEGATIVE
Specific Gravity, Urine: <=1.005 — AB (ref 1.000–1.030)
Total Protein, Urine: NEGATIVE
Urine Glucose: NEGATIVE
Urobilinogen, UA: 0.2 (ref 0.0–1.0)
WBC, UA: NONE SEEN (ref 0–?)
pH: 6.5 (ref 5.0–8.0)

## 2019-07-18 LAB — BASIC METABOLIC PANEL
BUN: 8 mg/dL (ref 6–23)
CO2: 27 mEq/L (ref 19–32)
Calcium: 9.4 mg/dL (ref 8.4–10.5)
Chloride: 98 mEq/L (ref 96–112)
Creatinine, Ser: 0.61 mg/dL (ref 0.40–1.20)
GFR: 95.8 mL/min (ref >60.00)
Glucose, Bld: 89 mg/dL (ref 70–99)
Potassium: 4 mEq/L (ref 3.5–5.1)
Sodium: 129 mEq/L — ABNORMAL LOW (ref 135–145)

## 2019-07-18 LAB — VITAMIN D 25 HYDROXY (VIT D DEFICIENCY, FRACTURES): VITD: 25.56 ng/mL — ABNORMAL LOW (ref 30.00–100.00)

## 2019-07-18 LAB — HEPATIC FUNCTION PANEL
ALT: 13 U/L (ref 0–35)
AST: 20 U/L (ref 0–37)
Albumin: 4.4 g/dL (ref 3.5–5.2)
Alkaline Phosphatase: 71 U/L (ref 39–117)
Bilirubin, Direct: 0.1 mg/dL (ref 0.0–0.3)
Total Bilirubin: 0.4 mg/dL (ref 0.2–1.2)
Total Protein: 7.1 g/dL (ref 6.0–8.3)

## 2019-07-18 LAB — LIPID PANEL
Cholesterol: 246 mg/dL — ABNORMAL HIGH (ref 0–200)
HDL: 81.9 mg/dL (ref >39.00)
NonHDL: 163.95
Total CHOL/HDL Ratio: 3
Triglycerides: 216 mg/dL — ABNORMAL HIGH (ref 0.0–149.0)
VLDL: 43.2 mg/dL — ABNORMAL HIGH (ref 0.0–40.0)

## 2019-07-18 LAB — SEDIMENTATION RATE: Sed Rate: 16 mm/hr (ref 0–30)

## 2019-07-18 LAB — C-REACTIVE PROTEIN: CRP: <1 mg/dL (ref 0.5–20.0)

## 2019-07-18 LAB — TSH: TSH: 2.43 u[IU]/mL (ref 0.35–4.50)

## 2019-07-18 LAB — VITAMIN B12: Vitamin B-12: 503 pg/mL (ref 211–911)

## 2019-07-18 MED ORDER — CELECOXIB 200 MG PO CAPS: 200.0000 mg | ORAL_CAPSULE | Freq: Two times a day (BID) | ORAL | 5 refills | Status: DC | PRN

## 2019-07-18 MED ORDER — VITAMIN D (ERGOCALCIFEROL) 1.25 MG (50000 UNIT) PO CAPS: 50000.0000 [IU] | ORAL_CAPSULE | ORAL | 0 refills | Status: DC

## 2019-07-18 MED ORDER — AMLODIPINE BESYLATE 5 MG PO TABS: 5.0000 mg | ORAL_TABLET | Freq: Every day | ORAL | 3 refills | Status: DC

## 2019-07-18 MED ORDER — ATORVASTATIN CALCIUM 10 MG PO TABS: 10.0000 mg | ORAL_TABLET | Freq: Every day | ORAL | 3 refills | Status: DC

## 2019-07-18 NOTE — Progress Notes (Signed)
Subjective:    Patient ID: Isabel Christensen, female    DOB: 04/16/1945, 74 y.o.   MRN: [id number]  HPI  Here for wellness and re-establish new pt;  Overall doing ok;  Pt denies Chest pain, worsening SOB, DOE, wheezing, orthopnea, PND, worsening LE edema, palpitations, dizziness or syncope.  Pt denies neurological change such as new headache, facial or extremity weakness.  Pt denies polydipsia, polyuria, or low sugar symptoms. Pt states overall good compliance with treatment and medications, good tolerability, and has been trying to follow appropriate diet.  Pt denies worsening depressive symptoms, suicidal ideation or panic. No fever, night sweats, wt loss, loss of appetite, or other constitutional symptoms.  Pt states good ability with ADL's, has low fall risk, home safety reviewed and adequate, no other significant changes in hearing or vision, and only occasionally active with exercise.  Pt continues to have recurring LBP without change in severity, bowel or bladder change, fever, wt loss,  worsening LE pain/numbness/weakness, gait change or falls. Also -  June 30 - appt for colonoscopy prep.  BP at home has been 150/90 or higher on average - new problem for her lifetime.  Also c/o diffuse polyarthralgias without swelling but has always been very active and physical with work and play and now slower.   Past Medical History:  Diagnosis Date  . Brain aneurysm   . DIVERTICULOSIS, COLON 06/05/2007   Qualifier: Diagnosis of  By: Jenny Reichmann MD, Hunt Oris   . GERD 06/05/2007   Qualifier: Diagnosis of  By: Elveria Royals   . GOITER 03/03/2008   Qualifier: Diagnosis of  By: Jenny Reichmann MD, Hunt Oris   . Hematuria    FOLLOWED BY UROLOGIST FOR THIS  . HYPERLIPIDEMIA 06/05/2007   Qualifier: Diagnosis of  By: Jenny Reichmann MD, Hunt Oris   . Osteopenia 04/2018   T score -1.4 FRAX 9.5% / 2.6%  . Vitamin D deficiency    Past Surgical History:  Procedure Laterality Date  . dental implant    . NOSE SURGERY    . OOPHORECTOMY    .  VAGINAL HYSTERECTOMY  1976   FOR STERILIZATION    reports that she has been smoking cigarettes. She has been smoking about 0.50 packs per day. She has never used smokeless tobacco. She reports current alcohol use of about 10.0 standard drinks of alcohol per week. She reports that she does not use drugs. family history includes Breast cancer in her mother and sister; Cancer in her sister and sister; Diabetes in her mother; Heart disease in her mother; Hypertension in her mother. Allergies  Allergen Reactions  . Aspirin     REACTION: brain aneurysm  . Black Cohosh     REACTION: hives  . Promethazine Hcl     REACTION: rash   Current Outpatient Medications on File Prior to Visit  Medication Sig Dispense Refill  . estradiol (VIVELLE-DOT) 0.05 MG/24HR patch Place 1 patch (0.05 mg total) onto the skin 2 (two) times a week. 24 patch 4  . lansoprazole (PREVACID) 15 MG capsule TAKE 1 CAPSULE EVERY DAY 90 capsule 3  . Misc Natural Products (LUTEIN 20) CAPS Take 1 tablet by mouth daily.    . Multiple Vitamin (MULTIVITAMIN) tablet Take 1 tablet by mouth daily.    . psyllium (METAMUCIL) 58.6 % packet Take 1 packet by mouth daily.     No current facility-administered medications on file prior to visit.   Review of Systems All otherwise neg per pt  Objective:   Physical Exam BP (!) 142/80 (BP Location: Left Arm, Patient Position: Sitting, Cuff Size: Large)   Pulse 68   Temp 98.3 F (36.8 C) (Oral)   Ht 5\' 4"  (1.626 m)   Wt 111 lb (50.3 kg)   SpO2 98%   BMI 19.05 kg/m  VS noted,  Constitutional: Pt appears in NAD HENT: Head: NCAT.  Right Ear: External ear normal.  Left Ear: External ear normal.  Eyes: . Pupils are equal, round, and reactive to light. Conjunctivae and EOM are normal Nose: without d/c or deformity Neck: Neck supple. Gross normal ROM Cardiovascular: Normal rate and regular rhythm.   Pulmonary/Chest: Effort normal and breath sounds without rales or wheezing.  Abd:   Soft, NT, ND, + BS, no organomegaly Neurological: Pt is alert. At baseline orientation, motor grossly intact Skin: Skin is warm. No rashes, other new lesions, no LE edema Psychiatric: Pt behavior is normal without agitation  All otherwise neg per pt Lab Results  Component Value Date   WBC 9.8 07/18/2019   HGB 14.1 07/18/2019   HCT 40.7 07/18/2019   PLT 288.0 07/18/2019   GLUCOSE 89 07/18/2019   CHOL 246 (H) 07/18/2019   TRIG 216.0 (H) 07/18/2019   HDL 81.90 07/18/2019   LDLDIRECT 126.0 07/18/2019   ALT 13 07/18/2019   AST 20 07/18/2019   NA 129 (L) 07/18/2019   K 4.0 07/18/2019   CL 98 07/18/2019   CREATININE 0.61 07/18/2019   BUN 8 07/18/2019   CO2 27 07/18/2019   TSH 2.43 07/18/2019   HGBA1C 5.8 01/29/2017      Assessment & Plan:

## 2019-07-18 NOTE — Patient Instructions (Signed)
Please take all new medication as prescribed - the celebrex as needed, and the amlodipine for blood pressure  Please continue all other medications as before, and refills have been done if requested.  Please have the pharmacy call with any other refills you may need.  Please continue your efforts at being more active, low cholesterol diet, and weight control.  You are otherwise up to date with prevention measures today.  Please keep your appointments with your specialists as you may have planned  Please go to the LAB at the blood drawing area for the tests to be done  You will be contacted by phone if any changes need to be made immediately.  Otherwise, you will receive a letter about your results with an explanation, but please check with MyChart first.  Please remember to sign up for MyChart if you have not done so, as this will be important to you in the future with finding out test results, communicating by private email, and scheduling acute appointments online when needed.  Please make an Appointment to return for your 1 year visit, or sooner if needed, with Lab testing by Appointment as well, to be done about 3-5 days before at the [facility] (so this is for TWO appointments - please see the scheduling desk as you leave)

## 2019-07-19 ENCOUNTER — Encounter: Payer: Self-pay | Admitting: Internal Medicine

## 2019-07-19 NOTE — Assessment & Plan Note (Signed)
New worsening, for amlodipine 5 qd

## 2019-07-19 NOTE — Assessment & Plan Note (Addendum)
Suspect msk strain and djd/non inflammatory but new for her, for esr/crp/rA labs, tylenol prn,  to f/u any worsening symptoms or concerns  I spent 31 minutes in addition to time for CPX wellness examination in preparing to see the patient by review of recent labs, imaging and procedures, obtaining and reviewing separately obtained history, communicating with the patient and family or caregiver, ordering medications, tests or procedures, and documenting clinical information in the EHR including the differential Dx, treatment, and any further evaluation and other management of polyarhtralgia, hld, htn new onset, vit d def, smoking

## 2019-07-19 NOTE — Assessment & Plan Note (Signed)

## 2019-07-19 NOTE — Assessment & Plan Note (Signed)
Goal ldl < 100, for fu lab, low chol diet

## 2019-07-19 NOTE — Assessment & Plan Note (Signed)
For f/u vit lab

## 2019-07-19 NOTE — Assessment & Plan Note (Signed)
Counseled to quit 

## 2019-07-19 NOTE — Addendum Note (Signed)
Addended by: [name] on: 07/19/2019 05:44 AM   Modules accepted: Orders

## 2019-07-21 LAB — CYCLIC CITRUL PEPTIDE ANTIBODY, IGG: Cyclic Citrullin Peptide Ab: <16 UNITS

## 2019-07-21 LAB — HEPATITIS C ANTIBODY
Hepatitis C Ab: NONREACTIVE
SIGNAL TO CUT-OFF: 0.01 (ref <1.00)

## 2019-08-13 ENCOUNTER — Other Ambulatory Visit: Payer: Self-pay

## 2019-08-13 ENCOUNTER — Ambulatory Visit (AMBULATORY_SURGERY_CENTER): Payer: Self-pay

## 2019-08-13 VITALS — Ht 64.0 in | Wt 110.4 lb

## 2019-08-13 DIAGNOSIS — Z1211 Encounter for screening for malignant neoplasm of colon: Secondary | ICD-10-CM

## 2019-08-13 MED ORDER — SUTAB 1479-225-188 MG PO TABS: 12.0000 | ORAL_TABLET | ORAL | 0 refills | Status: DC

## 2019-08-13 NOTE — Progress Notes (Signed)
No allergies to soy or egg Pt is not on blood thinners or diet pills Denies issues with sedation/intubation Denies atrial flutter/fib Denies constipation   Pt is aware of Covid safety and care partner requirements.      

## 2019-08-15 ENCOUNTER — Encounter: Payer: Self-pay | Admitting: Gastroenterology

## 2019-08-25 ENCOUNTER — Ambulatory Visit (INDEPENDENT_AMBULATORY_CARE_PROVIDER_SITE_OTHER): Payer: Medicare HMO

## 2019-08-25 ENCOUNTER — Other Ambulatory Visit: Payer: Self-pay | Admitting: Gastroenterology

## 2019-08-25 DIAGNOSIS — Z1159 Encounter for screening for other viral diseases: Secondary | ICD-10-CM

## 2019-08-26 LAB — SARS CORONAVIRUS 2 (TAT 6-24 HRS): SARS Coronavirus 2: NEGATIVE

## 2019-08-27 ENCOUNTER — Encounter: Payer: Self-pay | Admitting: Gastroenterology

## 2019-08-27 ENCOUNTER — Ambulatory Visit (AMBULATORY_SURGERY_CENTER): Payer: Medicare HMO | Admitting: Gastroenterology

## 2019-08-27 ENCOUNTER — Other Ambulatory Visit: Payer: Self-pay

## 2019-08-27 VITALS — BP 178/85 | HR 62 | Temp 97.3°F | Resp 12 | Ht 64.0 in | Wt 110.0 lb

## 2019-08-27 DIAGNOSIS — Z1211 Encounter for screening for malignant neoplasm of colon: Secondary | ICD-10-CM | POA: Diagnosis not present

## 2019-08-27 DIAGNOSIS — D122 Benign neoplasm of ascending colon: Secondary | ICD-10-CM

## 2019-08-27 DIAGNOSIS — D124 Benign neoplasm of descending colon: Secondary | ICD-10-CM | POA: Diagnosis not present

## 2019-08-27 MED ORDER — SODIUM CHLORIDE 0.9 % IV SOLN
500.0000 mL | Freq: Once | INTRAVENOUS | Status: DC
Start: 2019-08-27 — End: 2019-08-27

## 2019-08-27 NOTE — Op Note (Signed)
[name] Procedure Date: 08/27/2019 10:52 AM MRN: [id number] Endoscopist: Remo Lipps P. Havery Moros , MD Age: 74 Referring MD:  Date of Birth: 12-02-1945 Gender: Female Account #: [id number] Procedure:                Colonoscopy Indications:              Screening for colorectal malignant neoplasm Medicines:                Monitored Anesthesia Care Procedure:                Pre-Anesthesia Assessment:                           - Prior to the procedure, a History and Physical                            was performed, and patient medications and                            allergies were reviewed. The patient's tolerance of                            previous anesthesia was also reviewed. The risks                            and benefits of the procedure and the sedation                            options and risks were discussed with the patient.                            All questions were answered, and informed consent                            was obtained. Prior Anticoagulants: The patient has                            taken no previous anticoagulant or antiplatelet                            agents. ASA Grade Assessment: II - A patient with                            mild systemic disease. After reviewing the risks                            and benefits, the patient was deemed in                            satisfactory condition to undergo the procedure.                           After obtaining informed consent, the colonoscope  was passed under direct vision. Throughout the                            procedure, the patient's blood pressure, pulse, and                            oxygen saturations were monitored continuously. The                            Colonoscope was introduced through the anus and                            advanced to the the cecum, identified by                            appendiceal orifice  and ileocecal valve. The                            colonoscopy was performed without difficulty. The                            patient tolerated the procedure well. The quality                            of the bowel preparation was good. The ileocecal                            valve, appendiceal orifice, and rectum were                            photographed. Scope In: 10:58:38 AM Scope Out: 11:20:24 AM Scope Withdrawal Time: 0 hours 16 minutes 24 seconds  Total Procedure Duration: 0 hours 21 minutes 46 seconds  Findings:                 The perianal and digital rectal examinations were                            normal.                           Three sessile polyps were found in the ascending                            colon. The polyps were 3 to 7 mm in size. These                            polyps were removed with a cold snare. Resection                            and retrieval were complete.                           A 4-5 mm polyp was found in the descending colon.  The polyp was sessile. The polyp was removed with a                            cold snare. Resection and retrieval were complete.                           Multiple small-mouthed diverticula were found in                            the sigmoid colon.                           The colon was tortuous.                           Anal papilla(e) were hypertrophied.                           The exam was otherwise without abnormality. Complications:            No immediate complications. Estimated blood loss:                            Minimal. Estimated Blood Loss:     Estimated blood loss was minimal. Impression:               - Three 3 to 7 mm polyps in the ascending colon,                            removed with a cold snare. Resected and retrieved.                           - One 4-5 mm polyp in the descending colon, removed                            with a cold snare. Resected and  retrieved.                           - Diverticulosis in the sigmoid colon.                           - Tortuous colon.                           - Anal papilla(e) were hypertrophied.                           - The examination was otherwise normal. Recommendation:           - Patient has a contact number available for                            emergencies. The signs and symptoms of potential                            delayed complications were discussed with the  patient. Return to normal activities tomorrow.                            Written discharge instructions were provided to the                            patient.                           - Resume previous diet.                           - Continue present medications.                           - Await pathology results. Remo Lipps P. Rabecca Birge, MD 08/27/2019 11:24:26 AM This report has been signed electronically.

## 2019-08-27 NOTE — Progress Notes (Signed)
A and O x3. Report to RN. Tolerated MAC anesthesia well. 

## 2019-08-27 NOTE — Progress Notes (Signed)
Called to room to assist during endoscopic procedure.  Patient ID and intended procedure confirmed with present staff. Received instructions for my participation in the procedure from the performing physician.  

## 2019-08-27 NOTE — Progress Notes (Signed)
Pt's states no medical or surgical changes since previsit or office visit. 

## 2019-08-27 NOTE — Patient Instructions (Signed)
Please read handouts provided. Continue present medications. Await pathology results.   YOU HAD AN ENDOSCOPIC PROCEDURE TODAY AT THE [facility]:   Refer to the procedure report that was given to you for any specific questions about what was found during the examination.  If the procedure report does not answer your questions, please call your gastroenterologist to clarify.  If you requested that your care partner not be given the details of your procedure findings, then the procedure report has been included in a sealed envelope for you to review at your convenience later.  YOU SHOULD EXPECT: Some feelings of bloating in the abdomen. Passage of more gas than usual.  Walking can help get rid of the air that was put into your GI tract during the procedure and reduce the bloating. If you had a lower endoscopy (such as a colonoscopy or flexible sigmoidoscopy) you may notice spotting of blood in your stool or on the toilet paper. If you underwent a bowel prep for your procedure, you may not have a normal bowel movement for a few days.  Please Note:  You might notice some irritation and congestion in your nose or some drainage.  This is from the oxygen used during your procedure.  There is no need for concern and it should clear up in a day or so.  SYMPTOMS TO REPORT IMMEDIATELY:  Following lower endoscopy (colonoscopy or flexible sigmoidoscopy):  Excessive amounts of blood in the stool  Significant tenderness or worsening of abdominal pains  Swelling of the abdomen that is new, acute  Fever of 100F or higher   For urgent or emergent issues, a gastroenterologist can be reached at any hour by calling [phone]. Do not use MyChart messaging for urgent concerns.    DIET:  We do recommend a small meal at first, but then you may proceed to your regular diet.  Drink plenty of fluids but you should avoid alcoholic beverages for 24 hours.  ACTIVITY:  You should plan to take it easy  for the rest of today and you should NOT DRIVE or use heavy machinery until tomorrow (because of the sedation medicines used during the test).    FOLLOW UP: Our staff will call the number listed on your records 48-72 hours following your procedure to check on you and address any questions or concerns that you may have regarding the information given to you following your procedure. If we do not reach you, we will leave a message.  We will attempt to reach you two times.  During this call, we will ask if you have developed any symptoms of COVID 19. If you develop any symptoms (ie: fever, flu-like symptoms, shortness of breath, cough etc.) before then, please call [phone].  If you test positive for Covid 19 in the 2 weeks post procedure, please call and report this information to us.    If any biopsies were taken you will be contacted by phone or by letter within the next 1-3 weeks.  Please call us at [phone] if you have not heard about the biopsies in 3 weeks.    SIGNATURES/CONFIDENTIALITY: You and/or your care partner have signed paperwork which will be entered into your electronic medical record.  These signatures attest to the fact that that the information above on your After Visit Summary has been reviewed and is understood.  Full responsibility of the confidentiality of this discharge information lies with you and/or your care-partner.  

## 2019-08-29 ENCOUNTER — Telehealth: Payer: Self-pay

## 2019-08-29 NOTE — Telephone Encounter (Signed)
°  Follow up Call-  Call back number 08/27/2019  Post procedure Call Back phone  # [phone]  Permission to leave phone message Yes  Some recent data might be hidden     Patient questions:  Do you have a fever, pain , or abdominal swelling? No. Pain Score  0 *  Have you tolerated food without any problems? Yes.    Have you been able to return to your normal activities? Yes.    Do you have any questions about your discharge instructions: Diet   No. Medications  No. Follow up visit  No.  Do you have questions or concerns about your Care? No.  Actions: * If pain score is 4 or above: No action needed, pain <4. 1. Have you developed a fever since your procedure? no  2.   Have you had an respiratory symptoms (SOB or cough) since your procedure? no  3.   Have you tested positive for COVID 19 since your procedure no  4.   Have you had any family members/close contacts diagnosed with the COVID 19 since your procedure?  no   If yes to any of these questions please route to Joylene John, RN and Erenest Rasher, RN

## 2019-09-03 ENCOUNTER — Encounter: Payer: Self-pay | Admitting: Gastroenterology

## 2019-09-26 ENCOUNTER — Ambulatory Visit: Payer: Medicare HMO

## 2019-09-26 ENCOUNTER — Telehealth: Payer: Self-pay | Admitting: Internal Medicine

## 2019-09-26 NOTE — Telephone Encounter (Signed)
° ° °  Please return call to patient °

## 2019-10-16 ENCOUNTER — Ambulatory Visit (INDEPENDENT_AMBULATORY_CARE_PROVIDER_SITE_OTHER): Payer: Medicare HMO

## 2019-10-16 DIAGNOSIS — Z0000 Encounter for general adult medical examination without abnormal findings: Secondary | ICD-10-CM

## 2019-10-16 NOTE — Progress Notes (Signed)
I connected with [name] today by telephone and verified that I am speaking with the correct person using two identifiers. Location patient: home Location provider: work Persons participating in the virtual visit: Chey Rachels and Lisette Abu, LPN  I discussed the limitations, risks, security and privacy concerns of performing an evaluation and management service by telephone and the availability of in person appointments. I also discussed with the patient that there may be a patient responsible charge related to this service. The patient expressed understanding and verbally consented to this telephonic visit.    Interactive audio and video telecommunications were attempted between this provider and patient, however failed, due to patient having technical difficulties OR patient did not have access to video capability.  We continued and completed visit with audio only.  Some vital signs may be absent or patient reported.   Time Spent with patient on telephone encounter: 20 minutes  Subjective:   Isabel Christensen is a 74 y.o. female who presents for Medicare Annual (Subsequent) preventive examination.  Review of Systems    No ROS. Medicare Wellness Virtual Visit. Additional risk factors are reflected in social history. Cardiac Risk Factors include: advanced age (>46men, >55 women);dyslipidemia;family history of premature cardiovascular disease;hypertension;smoking/ tobacco exposure     Objective:    There were no vitals filed for this visit. There is no height or weight on file to calculate BMI.  Advanced Directives 10/16/2019  Does Patient Have a Medical Advance Directive? No  Would patient like information on creating a medical advance directive? No - Patient declined    Current Medications (verified) Outpatient Encounter Medications as of 10/16/2019  Medication Sig  . celecoxib (CELEBREX) 200 MG capsule Take 1 capsule (200 mg total) by mouth 2 (two) times daily as needed.    Marland Kitchen estradiol (VIVELLE-DOT) 0.05 MG/24HR patch Place 1 patch (0.05 mg total) onto the skin 2 (two) times a week.  . lansoprazole (PREVACID) 15 MG capsule TAKE 1 CAPSULE EVERY DAY  . Misc Natural Products (LUTEIN 20) CAPS Take 1 tablet by mouth daily.  . Multiple Vitamin (MULTIVITAMIN) tablet Take 1 tablet by mouth daily.  . psyllium (METAMUCIL) 58.6 % packet Take 1 packet by mouth daily.  Marland Kitchen amLODipine (NORVASC) 5 MG tablet Take 1 tablet (5 mg total) by mouth daily. (Patient not taking: Reported on 08/13/2019)  . [DISCONTINUED] Vitamin D, Ergocalciferol, (DRISDOL) 1.25 MG (50000 UNIT) CAPS capsule Take 1 capsule (50,000 Units total) by mouth every 7 (seven) days.   No facility-administered encounter medications on file as of 10/16/2019.    Allergies (verified) Aspirin, Black cohosh, and Promethazine hcl   History: Past Medical History:  Diagnosis Date  . Brain aneurysm   . DIVERTICULOSIS, COLON 06/05/2007   Qualifier: Diagnosis of  By: Jenny Reichmann MD, Hunt Oris   . GERD 06/05/2007   Qualifier: Diagnosis of  By: Elveria Royals   . GOITER 03/03/2008   Qualifier: Diagnosis of  By: Jenny Reichmann MD, Hunt Oris   . Hematuria    FOLLOWED BY UROLOGIST FOR THIS  . HYPERLIPIDEMIA 06/05/2007   Qualifier: Diagnosis of  By: Jenny Reichmann MD, Hunt Oris   . Hypertension   . Osteopenia 04/2018   T score -1.4 FRAX 9.5% / 2.6%  . Vitamin D deficiency    Past Surgical History:  Procedure Laterality Date  . cateract surgery    . COLONOSCOPY  2011  . dental implant    . NOSE SURGERY    . OOPHORECTOMY    . VAGINAL HYSTERECTOMY  1976   FOR STERILIZATION   Family History  Problem Relation Age of Onset  . Diabetes Mother   . Hypertension Mother   . Heart disease Mother   . Breast cancer Mother        Age 24  . Cancer Sister        MELANOMA  . Breast cancer Sister        Age 3  . Cancer Sister        Melanoma,Lymphoma  . Colon cancer Neg Hx   . Colon polyps Neg Hx   . Esophageal cancer Neg Hx   . Rectal cancer Neg  Hx   . Stomach cancer Neg Hx    Social History   Socioeconomic History  . Marital status: Married    Spouse name: Not on file  . Number of children: Not on file  . Years of education: Not on file  . Highest education level: Not on file  Occupational History  . Not on file  Tobacco Use  . Smoking status: Current Every Day Smoker    Packs/day: 0.50    Types: Cigarettes  . Smokeless tobacco: Never Used  Vaping Use  . Vaping Use: Never used  Substance and Sexual Activity  . Alcohol use: Not on file  . Drug use: No  . Sexual activity: Yes    Birth control/protection: Surgical    Comment: 1st intercourse 74 yo-Fewer than 5 partners  Other Topics Concern  . Not on file  Social History Narrative  . Not on file   Social Determinants of Health   Financial Resource Strain: Low Risk   . Difficulty of Paying Living Expenses: Not hard at all  Food Insecurity: No Food Insecurity  . Worried About Charity fundraiser in the Last Year: Never true  . Ran Out of Food in the Last Year: Never true  Transportation Needs: No Transportation Needs  . Lack of Transportation (Medical): No  . Lack of Transportation (Non-Medical): No  Physical Activity: Sufficiently Active  . Days of Exercise per Week: 5 days  . Minutes of Exercise per Session: 30 min  Stress: No Stress Concern Present  . Feeling of Stress : Not at all  Social Connections: Socially Integrated  . Frequency of Communication with Friends and Family: More than three times a week  . Frequency of Social Gatherings with Friends and Family: More than three times a week  . Attends Religious Services: More than 4 times per year  . Active Member of Clubs or Organizations: Yes  . Attends Archivist Meetings: More than 4 times per year  . Marital Status: Married    Tobacco Counseling Ready to quit: Not Answered Counseling given: Not Answered   Clinical Intake:  Pre-visit preparation completed: Yes  Pain : No/denies  pain     Nutritional Risks: None Diabetes: No  How often do you need to have someone help you when you read instructions, pamphlets, or other written materials from your doctor or pharmacy?: 1 - Never What is the last grade level you completed in school?: High School Graduate  Diabetic? no  Interpreter Needed?: No  Information entered by :: Coltyn Hanning N. Jaymz Traywick, LPN   Activities of Daily Living In your present state of health, do you have any difficulty performing the following activities: 10/16/2019  Hearing? N  Vision? N  Difficulty concentrating or making decisions? N  Walking or climbing stairs? N  Dressing or bathing? N  Doing errands, shopping? N  Preparing Food and eating ? N  Using the Toilet? N  In the past six months, have you accidently leaked urine? N  Do you have problems with loss of bowel control? N  Managing your Medications? N  Managing your Finances? N  Housekeeping or managing your Housekeeping? N  Some recent data might be hidden    Patient Care Team: [name], MD as PCP - General  Indicate any recent Medical Services you may have received from other than [facility] providers in the past year (date may be approximate).     Assessment:   This is a routine wellness examination for [place].  Hearing/Vision screen No exam data present  Dietary issues and exercise activities discussed: Current Exercise Habits: Home exercise routine, Type of exercise: walking;Other - see comments (yard work; gardening and landscapings; picks up 80 pound rocks for [employer]), Time (Minutes): 60, Frequency (Times/Week): 7, Weekly Exercise (Minutes/Week): 420, Intensity: Moderate, Exercise limited by: None identified  Goals    .  Patient Stated (pt-stated)      My goal is to maintain my current health status by continuing to eat healthy and stay physically active & socially active. And make it beyond age 29.      Depression Screen PHQ 2/9 Scores 10/16/2019  07/18/2019 07/18/2019 01/24/2017 05/11/2015 05/11/2015 04/28/2014  PHQ - 2 Score 0 0 0 0 0 0 0    Fall Risk Fall Risk  10/16/2019 07/18/2019 07/18/2019 01/24/2017 05/11/2015  Falls in the past year? 0 0 0 No No  Number falls in past yr: 0 - 0 - -  Injury with Fall? 0 - 0 - -  Risk for fall due to : No Fall Risks - No Fall Risks - -  Follow up Falls evaluation completed - Falls evaluation completed - -    Any stairs in or around the home? Yes  If so, are there any without handrails? No  Home free of loose throw rugs in walkways, pet beds, electrical cords, etc? Yes  Adequate lighting in your home to reduce risk of falls? Yes   ASSISTIVE DEVICES UTILIZED TO PREVENT FALLS:  Life alert? No  Use of a cane, walker or w/c? No  Grab bars in the bathroom? Yes  Shower chair or bench in shower? No  Elevated toilet seat or a handicapped toilet? No   TIMED UP AND GO:  Was the test performed? No .  Length of time to ambulate 10 feet: 0 sec.   Gait steady and fast without use of assistive device  Cognitive Function: Patient is cogitatively intact.        Immunizations Immunization History  Administered Date(s) Administered  . Pneumococcal Conjugate-13 04/28/2014  . Td 02/13/2008  . Zoster 02/13/2008    TDAP status: Due, Education has been provided regarding the importance of this vaccine. Advised may receive this vaccine at local pharmacy or Health Dept. Aware to provide a copy of the vaccination record if obtained from local pharmacy or Health Dept. Verbalized acceptance and understanding. Flu Vaccine status: Declined, Education has been provided regarding the importance of this vaccine but patient still declined. Advised may receive this vaccine at local pharmacy or Health Dept. Aware to provide a copy of the vaccination record if obtained from local pharmacy or Health Dept. Verbalized acceptance and understanding. Pneumococcal vaccine status: Declined,  Education has been provided regarding  the importance of this vaccine but patient still declined. Advised may receive this vaccine at local pharmacy or Health Dept.  Aware to provide a copy of the vaccination record if obtained from local pharmacy or Health Dept. Verbalized acceptance and understanding.  Covid-19 vaccine status: Declined, Education has been provided regarding the importance of this vaccine but patient still declined. Advised may receive this vaccine at local pharmacy or Health Dept.or vaccine clinic. Aware to provide a copy of the vaccination record if obtained from local pharmacy or Health Dept. Verbalized acceptance and understanding.  Qualifies for Shingles Vaccine? Yes   Zostavax completed Yes   Shingrix Completed?: No.    Education has been provided regarding the importance of this vaccine. Patient has been advised to call insurance company to determine out of pocket expense if they have not yet received this vaccine. Advised may also receive vaccine at local pharmacy or Health Dept. Verbalized acceptance and understanding.  Screening Tests Health Maintenance  Topic Date Due  . COVID-19 Vaccine (1) Never done  . TETANUS/TDAP  02/12/2018  . INFLUENZA VACCINE  09/28/2019  . PNA vac Low Risk Adult (2 of 2 - PPSV23) 07/17/2020 (Originally 04/28/2015)  . MAMMOGRAM  11/20/2020  . COLONOSCOPY  08/26/2029  . DEXA SCAN  Completed  . Hepatitis C Screening  Completed    Health Maintenance  Health Maintenance Due  Topic Date Due  . COVID-19 Vaccine (1) Never done  . TETANUS/TDAP  02/12/2018  . INFLUENZA VACCINE  09/28/2019    Colorectal cancer screening: Completed 08/27/2019. Repeat every 3-5 years Mammogram status: Completed 11/21/2018. Repeat every year Bone Density status: Completed 05/01/2018. Results reflect: Bone density results: NORMAL. Repeat every 2-5 years.  Lung Cancer Screening: (Low Dose CT Chest recommended if Age 55-80 years, 30 pack-year currently smoking OR have quit w/in 15years.) does qualify.    Lung Cancer Screening Referral: no  Additional Screening:  Hepatitis C Screening: does qualify; Completed yes  Vision Screening: Recommended annual ophthalmology exams for early detection of glaucoma and other disorders of the eye. Is the patient up to date with their annual eye exam?  Yes  Who is the provider or what is the name of the office in which the patient attends annual eye exams? Gala Romney, MD If pt is not established with a provider, would they like to be referred to a provider to establish care? No .   Dental Screening: Recommended annual dental exams for proper oral hygiene  Community Resource Referral / Chronic Care Management: CRR required this visit?  No   CCM required this visit?  No      Plan:     I have personally reviewed and noted the following in the patient's chart:   . Medical and social history . Use of alcohol, tobacco or illicit drugs  . Current medications and supplements . Functional ability and status . Nutritional status . Physical activity . Advanced directives . List of other physicians . Hospitalizations, surgeries, and ER visits in previous 12 months . Vitals . Screenings to include cognitive, depression, and falls . Referrals and appointments  In addition, I have reviewed and discussed with patient certain preventive protocols, quality metrics, and best practice recommendations. A written personalized care plan for preventive services as well as general preventive health recommendations were provided to patient.     Sheral Flow, LPN   04/24/3333   Nurse Notes:  Patient is cogitatively intact. There were no vitals filed for this visit. There is no height or weight on file to calculate BMI. Patient stated that she has no issues with gait or balance; does not  use any assistive devices. Patient is very active and loves landscaping and gardening.

## 2019-10-16 NOTE — Patient Instructions (Addendum)
Ms. Maille , Thank you for taking time to come for your Medicare Wellness Visit. I appreciate your ongoing commitment to your health goals. Please review the following plan we discussed and let me know if I can assist you in the future.   Screening recommendations/referrals: Colonoscopy: 08/27/2019; due every 3-5 years Mammogram: 11/21/2018; due every year Bone Density: 05/01/2018; normal results Recommended yearly ophthalmology/optometry visit for glaucoma screening and checkup Recommended yearly dental visit for hygiene and checkup  Vaccinations: Influenza vaccine: declined Pneumococcal vaccine: need Pneumovax 23; Prevnar 13 done 04/28/2014 Tdap vaccine: overdue; will get at next appt Shingles vaccine: declined   Covid-19: declined  Advanced directives: Advance directive discussed with you today. Even though you declined this today please call our office should you change your mind and we can give you the proper paperwork for you to fill out.  Conditions/risks identified: Yes; Please continue to do your personal lifestyle choices by: daily care of teeth and gums, regular physical activity (goal should be 5 days a week for 30 minutes), eat a healthy diet, avoid tobacco and drug use, limiting any alcohol intake, taking a low-dose aspirin (if not allergic or have been advised by your provider otherwise) and taking vitamins and minerals as recommended by your provider. Continue doing brain stimulating activities (puzzles, reading, adult coloring books, staying active) to keep memory sharp. Continue to eat heart healthy diet (full of fruits, vegetables, whole grains, lean protein, water--limit salt, fat, and sugar intake) and increase physical activity as tolerated.  Next appointment: Please schedule your next Medicare Wellness Visit with your Nurse Health Advisor in 1 year.   Preventive Care 45 Years and Older, Female Preventive care refers to lifestyle choices and visits with your health care  provider that can promote health and wellness. What does preventive care include?  A yearly physical exam. This is also called an annual well check.  Dental exams once or twice a year.  Routine eye exams. Ask your health care provider how often you should have your eyes checked.  Personal lifestyle choices, including:  Daily care of your teeth and gums.  Regular physical activity.  Eating a healthy diet.  Avoiding tobacco and drug use.  Limiting alcohol use.  Practicing safe sex.  Taking low-dose aspirin every day.  Taking vitamin and mineral supplements as recommended by your health care provider. What happens during an annual well check? The services and screenings done by your health care provider during your annual well check will depend on your age, overall health, lifestyle risk factors, and family history of disease. Counseling  Your health care provider may ask you questions about your:  Alcohol use.  Tobacco use.  Drug use.  Emotional well-being.  Home and relationship well-being.  Sexual activity.  Eating habits.  History of falls.  Memory and ability to understand (cognition).  Work and work Statistician.  Reproductive health. Screening  You may have the following tests or measurements:  Height, weight, and BMI.  Blood pressure.  Lipid and cholesterol levels. These may be checked every 5 years, or more frequently if you are over 26 years old.  Skin check.  Lung cancer screening. You may have this screening every year starting at age 72 if you have a 30-pack-year history of smoking and currently smoke or have quit within the past 15 years.  Fecal occult blood test (FOBT) of the stool. You may have this test every year starting at age 57.  Flexible sigmoidoscopy or colonoscopy. You may have a  sigmoidoscopy every 5 years or a colonoscopy every 10 years starting at age 30.  Hepatitis C blood test.  Hepatitis B blood test.  Sexually  transmitted disease (STD) testing.  Diabetes screening. This is done by checking your blood sugar (glucose) after you have not eaten for a while (fasting). You may have this done every 1-3 years.  Bone density scan. This is done to screen for osteoporosis. You may have this done starting at age 20.  Mammogram. This may be done every 1-2 years. Talk to your health care provider about how often you should have regular mammograms. Talk with your health care provider about your test results, treatment options, and if necessary, the need for more tests. Vaccines  Your health care provider may recommend certain vaccines, such as:  Influenza vaccine. This is recommended every year.  Tetanus, diphtheria, and acellular pertussis (Tdap, Td) vaccine. You may need a Td booster every 10 years.  Zoster vaccine. You may need this after age 29.  Pneumococcal 13-valent conjugate (PCV13) vaccine. One dose is recommended after age 74.  Pneumococcal polysaccharide (PPSV23) vaccine. One dose is recommended after age 70. Talk to your health care provider about which screenings and vaccines you need and how often you need them. This information is not intended to replace advice given to you by your health care provider. Make sure you discuss any questions you have with your health care provider. Document Released: 03/12/2015 Document Revised: 11/03/2015 Document Reviewed: 12/15/2014 Elsevier Interactive Patient Education  2017 [place] Prevention in the Home Falls can cause injuries. They can happen to people of all ages. There are many things you can do to make your home safe and to help prevent falls. What can I do on the outside of my home?  Regularly fix the edges of walkways and driveways and fix any cracks.  Remove anything that might make you trip as you walk through a door, such as a raised step or threshold.  Trim any bushes or trees on the path to your home.  Use bright outdoor  lighting.  Clear any walking paths of anything that might make someone trip, such as rocks or tools.  Regularly check to see if handrails are loose or broken. Make sure that both sides of any steps have handrails.  Any raised decks and porches should have guardrails on the edges.  Have any leaves, snow, or ice cleared regularly.  Use sand or salt on walking paths during winter.  Clean up any spills in your garage right away. This includes oil or grease spills. What can I do in the bathroom?  Use night lights.  Install grab bars by the toilet and in the tub and shower. Do not use towel bars as grab bars.  Use non-skid mats or decals in the tub or shower.  If you need to sit down in the shower, use a plastic, non-slip stool.  Keep the floor dry. Clean up any water that spills on the floor as soon as it happens.  Remove soap buildup in the tub or shower regularly.  Attach bath mats securely with double-sided non-slip rug tape.  Do not have throw rugs and other things on the floor that can make you trip. What can I do in the bedroom?  Use night lights.  Make sure that you have a light by your bed that is easy to reach.  Do not use any sheets or blankets that are too big for your bed. They  should not hang down onto the floor.  Have a firm chair that has side arms. You can use this for support while you get dressed.  Do not have throw rugs and other things on the floor that can make you trip. What can I do in the kitchen?  Clean up any spills right away.  Avoid walking on wet floors.  Keep items that you use a lot in easy-to-reach places.  If you need to reach something above you, use a strong step stool that has a grab bar.  Keep electrical cords out of the way.  Do not use floor polish or wax that makes floors slippery. If you must use wax, use non-skid floor wax.  Do not have throw rugs and other things on the floor that can make you trip. What can I do with my  stairs?  Do not leave any items on the stairs.  Make sure that there are handrails on both sides of the stairs and use them. Fix handrails that are broken or loose. Make sure that handrails are as long as the stairways.  Check any carpeting to make sure that it is firmly attached to the stairs. Fix any carpet that is loose or worn.  Avoid having throw rugs at the top or bottom of the stairs. If you do have throw rugs, attach them to the floor with carpet tape.  Make sure that you have a light switch at the top of the stairs and the bottom of the stairs. If you do not have them, ask someone to add them for you. What else can I do to help prevent falls?  Wear shoes that:  Do not have high heels.  Have rubber bottoms.  Are comfortable and fit you well.  Are closed at the toe. Do not wear sandals.  If you use a stepladder:  Make sure that it is fully opened. Do not climb a closed stepladder.  Make sure that both sides of the stepladder are locked into place.  Ask someone to hold it for you, if possible.  Clearly mark and make sure that you can see:  Any grab bars or handrails.  First and last steps.  Where the edge of each step is.  Use tools that help you move around (mobility aids) if they are needed. These include:  Canes.  Walkers.  Scooters.  Crutches.  Turn on the lights when you go into a dark area. Replace any light bulbs as soon as they burn out.  Set up your furniture so you have a clear path. Avoid moving your furniture around.  If any of your floors are uneven, fix them.  If there are any pets around you, be aware of where they are.  Review your medicines with your doctor. Some medicines can make you feel dizzy. This can increase your chance of falling. Ask your doctor what other things that you can do to help prevent falls. This information is not intended to replace advice given to you by your health care provider. Make sure you discuss any  questions you have with your health care provider. Document Released: 12/10/2008 Document Revised: 07/22/2015 Document Reviewed: 03/20/2014 Elsevier Interactive Patient Education  2017 [employer].

## 2019-11-11 DIAGNOSIS — Z85828 Personal history of other malignant neoplasm of skin: Secondary | ICD-10-CM | POA: Diagnosis not present

## 2019-11-11 DIAGNOSIS — L218 Other seborrheic dermatitis: Secondary | ICD-10-CM | POA: Diagnosis not present

## 2019-12-19 ENCOUNTER — Telehealth (INDEPENDENT_AMBULATORY_CARE_PROVIDER_SITE_OTHER): Payer: Medicare HMO | Admitting: Internal Medicine

## 2019-12-19 DIAGNOSIS — J309 Allergic rhinitis, unspecified: Secondary | ICD-10-CM | POA: Diagnosis not present

## 2019-12-19 DIAGNOSIS — J0140 Acute pansinusitis, unspecified: Secondary | ICD-10-CM

## 2019-12-19 MED ORDER — FEXOFENADINE HCL 180 MG PO TABS
180.0000 mg | ORAL_TABLET | Freq: Every day | ORAL | 11 refills | Status: DC
Start: 2019-12-19 — End: 2020-07-19

## 2019-12-19 MED ORDER — TRIAMCINOLONE ACETONIDE 55 MCG/ACT NA AERO
2.0000 | INHALATION_SPRAY | Freq: Every day | NASAL | 12 refills | Status: DC
Start: 2019-12-19 — End: 2020-07-19

## 2019-12-19 MED ORDER — AZITHROMYCIN 250 MG PO TABS: ORAL_TABLET | ORAL | 0 refills | Status: DC

## 2019-12-19 MED ORDER — METHYLPREDNISOLONE 4 MG PO TBPK
ORAL_TABLET | ORAL | 0 refills | Status: DC
Start: 2019-12-19 — End: 2020-07-19

## 2019-12-19 NOTE — Progress Notes (Signed)
Patient ID: Isabel Christensen, female   DOB: 03/04/1945, 74 y.o.   MRN: [id number]  Cumulative time during 7-day interval 12 min, there was not an associated office visit for this concern within a 7 day period.  Verbal consent for services obtained from patient prior to services given.  Names of all persons present for services: Cathlean Cower, MD, patient  Chief complaint: congestion  History, background, results pertinent:   Here with 2-3 days acute onset fever, facial pain, pressure, headache, general weakness and malaise, and greenish d/c, with mild ST and cough, but pt denies chest pain, wheezing, increased sob or doe, orthopnea, PND, increased LE swelling, palpitations, dizziness or syncope.  Does have several wks ongoing nasal allergy symptoms with clearish congestion, itch and sneezing, without fever, pain, ST, cough, swelling or wheezing.  At home - BP 149/76, temp 97.6 Past Medical History:  Diagnosis Date  . Brain aneurysm   . DIVERTICULOSIS, COLON 06/05/2007   Qualifier: Diagnosis of  By: Jenny Reichmann MD, Hunt Oris   . GERD 06/05/2007   Qualifier: Diagnosis of  By: Elveria Royals   . GOITER 03/03/2008   Qualifier: Diagnosis of  By: Jenny Reichmann MD, Hunt Oris   . Hematuria    FOLLOWED BY UROLOGIST FOR THIS  . HYPERLIPIDEMIA 06/05/2007   Qualifier: Diagnosis of  By: Jenny Reichmann MD, Hunt Oris   . Hypertension   . Osteopenia 04/2018   T score -1.4 FRAX 9.5% / 2.6%  . Vitamin D deficiency    No results found for this or any previous visit (from the past 84 hour(s)). Current Outpatient Medications on File Prior to Visit  Medication Sig Dispense Refill  . amLODipine (NORVASC) 5 MG tablet Take 1 tablet (5 mg total) by mouth daily. (Patient not taking: Reported on 08/13/2019) 90 tablet 3  . celecoxib (CELEBREX) 200 MG capsule Take 1 capsule (200 mg total) by mouth 2 (two) times daily as needed. 60 capsule 5  . estradiol (VIVELLE-DOT) 0.05 MG/24HR patch Place 1 patch (0.05 mg total) onto the skin 2 (two) times a week.  24 patch 4  . lansoprazole (PREVACID) 15 MG capsule TAKE 1 CAPSULE EVERY DAY 90 capsule 3  . Misc Natural Products (LUTEIN 20) CAPS Take 1 tablet by mouth daily.    . Multiple Vitamin (MULTIVITAMIN) tablet Take 1 tablet by mouth daily.    . psyllium (METAMUCIL) 58.6 % packet Take 1 packet by mouth daily.     No current facility-administered medications on file prior to visit.   A/P/next steps:   Sinusitis - for zpack, and covid testing  Allergies- for allegra, nasacort asd,  to f/u any worsening symptoms or concerns  Cathlean Cower MD

## 2019-12-20 ENCOUNTER — Encounter: Payer: Self-pay | Admitting: Internal Medicine

## 2019-12-20 DIAGNOSIS — J309 Allergic rhinitis, unspecified: Secondary | ICD-10-CM | POA: Insufficient documentation

## 2019-12-20 NOTE — Patient Instructions (Signed)
Please take all new medication as prescribed 

## 2019-12-20 NOTE — Assessment & Plan Note (Signed)
For otc allegra, nasacort asd,  to f/u any worsening symptoms or concerns

## 2019-12-20 NOTE — Assessment & Plan Note (Signed)
Mild to mod, for antibx course,  to f/u any worsening symptoms or concerns 

## 2020-04-08 DIAGNOSIS — L821 Other seborrheic keratosis: Secondary | ICD-10-CM | POA: Diagnosis not present

## 2020-04-08 DIAGNOSIS — D2271 Melanocytic nevi of right lower limb, including hip: Secondary | ICD-10-CM | POA: Diagnosis not present

## 2020-04-08 DIAGNOSIS — Z85828 Personal history of other malignant neoplasm of skin: Secondary | ICD-10-CM | POA: Diagnosis not present

## 2020-04-08 DIAGNOSIS — D224 Melanocytic nevi of scalp and neck: Secondary | ICD-10-CM | POA: Diagnosis not present

## 2020-04-08 DIAGNOSIS — L218 Other seborrheic dermatitis: Secondary | ICD-10-CM | POA: Diagnosis not present

## 2020-04-08 DIAGNOSIS — D1801 Hemangioma of skin and subcutaneous tissue: Secondary | ICD-10-CM | POA: Diagnosis not present

## 2020-04-08 DIAGNOSIS — L570 Actinic keratosis: Secondary | ICD-10-CM | POA: Diagnosis not present

## 2020-04-08 DIAGNOSIS — D2272 Melanocytic nevi of left lower limb, including hip: Secondary | ICD-10-CM | POA: Diagnosis not present

## 2020-04-08 DIAGNOSIS — L111 Transient acantholytic dermatosis [Grover]: Secondary | ICD-10-CM | POA: Diagnosis not present

## 2020-07-07 DIAGNOSIS — H26493 Other secondary cataract, bilateral: Secondary | ICD-10-CM | POA: Diagnosis not present

## 2020-07-07 DIAGNOSIS — H43813 Vitreous degeneration, bilateral: Secondary | ICD-10-CM | POA: Diagnosis not present

## 2020-07-07 DIAGNOSIS — H353131 Nonexudative age-related macular degeneration, bilateral, early dry stage: Secondary | ICD-10-CM | POA: Diagnosis not present

## 2020-07-07 DIAGNOSIS — H524 Presbyopia: Secondary | ICD-10-CM | POA: Diagnosis not present

## 2020-07-19 ENCOUNTER — Encounter: Payer: Self-pay | Admitting: Internal Medicine

## 2020-07-19 ENCOUNTER — Other Ambulatory Visit: Payer: Self-pay

## 2020-07-19 ENCOUNTER — Ambulatory Visit (INDEPENDENT_AMBULATORY_CARE_PROVIDER_SITE_OTHER): Payer: Medicare HMO | Admitting: Internal Medicine

## 2020-07-19 VITALS — BP 152/70 | HR 69 | Temp 98.3°F | Ht 64.0 in | Wt 111.4 lb

## 2020-07-19 DIAGNOSIS — E559 Vitamin D deficiency, unspecified: Secondary | ICD-10-CM | POA: Diagnosis not present

## 2020-07-19 DIAGNOSIS — Z23 Encounter for immunization: Secondary | ICD-10-CM

## 2020-07-19 DIAGNOSIS — F17200 Nicotine dependence, unspecified, uncomplicated: Secondary | ICD-10-CM

## 2020-07-19 DIAGNOSIS — R42 Dizziness and giddiness: Secondary | ICD-10-CM | POA: Insufficient documentation

## 2020-07-19 DIAGNOSIS — Z0001 Encounter for general adult medical examination with abnormal findings: Secondary | ICD-10-CM

## 2020-07-19 DIAGNOSIS — E7800 Pure hypercholesterolemia, unspecified: Secondary | ICD-10-CM | POA: Diagnosis not present

## 2020-07-19 DIAGNOSIS — I10 Essential (primary) hypertension: Secondary | ICD-10-CM

## 2020-07-19 DIAGNOSIS — K219 Gastro-esophageal reflux disease without esophagitis: Secondary | ICD-10-CM | POA: Diagnosis not present

## 2020-07-19 DIAGNOSIS — M1990 Unspecified osteoarthritis, unspecified site: Secondary | ICD-10-CM | POA: Diagnosis not present

## 2020-07-19 DIAGNOSIS — R69 Illness, unspecified: Secondary | ICD-10-CM | POA: Diagnosis not present

## 2020-07-19 LAB — CBC WITH DIFFERENTIAL/PLATELET
Basophils Absolute: 0.1 10*3/uL (ref 0.0–0.1)
Basophils Relative: 0.7 % (ref 0.0–3.0)
Eosinophils Absolute: 0.2 10*3/uL (ref 0.0–0.7)
Eosinophils Relative: 1.8 % (ref 0.0–5.0)
HCT: 40.5 % (ref 36.0–46.0)
Hemoglobin: 13.9 g/dL (ref 12.0–15.0)
Lymphocytes Relative: 29.7 % (ref 12.0–46.0)
Lymphs Abs: 2.7 10*3/uL (ref 0.7–4.0)
MCHC: 34.4 g/dL (ref 30.0–36.0)
MCV: 97.1 fl (ref 78.0–100.0)
Monocytes Absolute: 0.7 10*3/uL (ref 0.1–1.0)
Monocytes Relative: 7.3 % (ref 3.0–12.0)
Neutro Abs: 5.5 10*3/uL (ref 1.4–7.7)
Neutrophils Relative %: 60.5 % (ref 43.0–77.0)
Platelets: 313 10*3/uL (ref 150.0–400.0)
RBC: 4.18 Mil/uL (ref 3.87–5.11)
RDW: 12.6 % (ref 11.5–15.5)
WBC: 9.1 10*3/uL (ref 4.0–10.5)

## 2020-07-19 LAB — BASIC METABOLIC PANEL
BUN: 10 mg/dL (ref 6–23)
CO2: 27 mEq/L (ref 19–32)
Calcium: 9.5 mg/dL (ref 8.4–10.5)
Chloride: 101 mEq/L (ref 96–112)
Creatinine, Ser: 0.59 mg/dL (ref 0.40–1.20)
GFR: 88.22 mL/min (ref >60.00)
Glucose, Bld: 89 mg/dL (ref 70–99)
Potassium: 4 mEq/L (ref 3.5–5.1)
Sodium: 137 mEq/L (ref 135–145)

## 2020-07-19 LAB — URINALYSIS, ROUTINE W REFLEX MICROSCOPIC
Bilirubin Urine: NEGATIVE
Ketones, ur: NEGATIVE
Leukocytes,Ua: NEGATIVE
Nitrite: NEGATIVE
Specific Gravity, Urine: <=1.005 — AB (ref 1.000–1.030)
Total Protein, Urine: NEGATIVE
Urine Glucose: NEGATIVE
Urobilinogen, UA: 0.2 (ref 0.0–1.0)
WBC, UA: NONE SEEN (ref 0–?)
pH: 6 (ref 5.0–8.0)

## 2020-07-19 LAB — HEPATIC FUNCTION PANEL
ALT: 11 U/L (ref 0–35)
AST: 18 U/L (ref 0–37)
Albumin: 4.4 g/dL (ref 3.5–5.2)
Alkaline Phosphatase: 68 U/L (ref 39–117)
Bilirubin, Direct: 0.1 mg/dL (ref 0.0–0.3)
Total Bilirubin: 0.4 mg/dL (ref 0.2–1.2)
Total Protein: 7.1 g/dL (ref 6.0–8.3)

## 2020-07-19 LAB — LIPID PANEL
Cholesterol: 246 mg/dL — ABNORMAL HIGH (ref 0–200)
HDL: 92.2 mg/dL (ref >39.00)
LDL Cholesterol: 118 mg/dL — ABNORMAL HIGH (ref 0–99)
NonHDL: 154
Total CHOL/HDL Ratio: 3
Triglycerides: 181 mg/dL — ABNORMAL HIGH (ref 0.0–149.0)
VLDL: 36.2 mg/dL (ref 0.0–40.0)

## 2020-07-19 LAB — TSH: TSH: 2.16 u[IU]/mL (ref 0.35–4.50)

## 2020-07-19 LAB — VITAMIN D 25 HYDROXY (VIT D DEFICIENCY, FRACTURES): VITD: 32.76 ng/mL (ref 30.00–100.00)

## 2020-07-19 NOTE — Progress Notes (Signed)
Patient ID: Isabel Christensen, female   DOB: 09-12-1945, 75 y.o.   MRN: [id number]         Chief Complaint:: wellness exam and low vit d, hld, gerd, djd, vertigo       HPI:  [name] is a 75 y.o. female here for wellness exam; due for tdap, o/w up to date with preventive referrals and immunizations                        Also trying to follow a lower chol diet.  Did not take statin, will consider maybe in the future.  Not Taking Vit d.  Denies worsening reflux, abd pain, dysphagia, n/v, bowel change or blood.  Only taking the celebrex a few times per wk for the arthritis.  Does also have mild intermittent positional vertigo worsening x 3-4 mo, but still mild.   Pt denies polydipsia, polyuria, or otgher new focal neuro s.s    Pt denies fever, wt loss, night sweats, loss of appetite, or other constitutional symptoms   Wt Readings from Last 3 Encounters:  07/19/20 111 lb 6.4 oz (50.5 kg)  08/27/19 110 lb (49.9 kg)  08/13/19 110 lb 6.4 oz (50.1 kg)   BP Readings from Last 3 Encounters:  07/19/20 (!) 152/70  08/27/19 (!) 178/85  07/18/19 (!) 142/80   Immunization History  Administered Date(s) Administered  . Pneumococcal Conjugate-13 04/28/2014  . Pneumococcal Polysaccharide-23 07/19/2020  . Td 02/13/2008  . Tdap 07/19/2020  . Zoster 02/13/2008   There are no preventive care reminders to display for this patient.   Past Medical History:  Diagnosis Date  . Brain aneurysm   . DIVERTICULOSIS, COLON 06/05/2007   Qualifier: Diagnosis of  By: Jenny Reichmann MD, Hunt Oris   . GERD 06/05/2007   Qualifier: Diagnosis of  By: Elveria Royals   . GOITER 03/03/2008   Qualifier: Diagnosis of  By: Jenny Reichmann MD, Hunt Oris   . Hematuria    FOLLOWED BY UROLOGIST FOR THIS  . HYPERLIPIDEMIA 06/05/2007   Qualifier: Diagnosis of  By: Jenny Reichmann MD, Hunt Oris   . Hypertension   . Osteopenia 04/2018   T score -1.4 FRAX 9.5% / 2.6%  . Vitamin D deficiency    Past Surgical History:  Procedure Laterality Date  . cateract surgery     . COLONOSCOPY  2011  . dental implant    . NOSE SURGERY    . OOPHORECTOMY    . VAGINAL HYSTERECTOMY  1976   FOR STERILIZATION    reports that she has been smoking cigarettes. She has been smoking about 0.50 packs per day. She has never used smokeless tobacco. She reports that she does not use drugs. No history on file for alcohol use. family history includes Breast cancer in her mother and sister; Cancer in her sister and sister; Diabetes in her mother; Heart disease in her mother; Hypertension in her mother. Allergies  Allergen Reactions  . Aspirin     REACTION: brain aneurysm  . Black Cohosh     REACTION: hives  . Promethazine Hcl     REACTION: rash   Current Outpatient Medications on File Prior to Visit  Medication Sig Dispense Refill  . amLODipine (NORVASC) 5 MG tablet Take 1 tablet (5 mg total) by mouth daily. 90 tablet 3  . celecoxib (CELEBREX) 200 MG capsule Take 1 capsule (200 mg total) by mouth 2 (two) times daily as needed. 60 capsule 5  . estradiol (VIVELLE-DOT) 0.05  MG/24HR patch Place 1 patch (0.05 mg total) onto the skin 2 (two) times a week. 24 patch 4  . lansoprazole (PREVACID) 15 MG capsule TAKE 1 CAPSULE EVERY DAY 90 capsule 3  . Misc Natural Products (LUTEIN 20) CAPS Take 1 tablet by mouth daily.    . Multiple Vitamin (MULTIVITAMIN) tablet Take 1 tablet by mouth daily.    . psyllium (METAMUCIL) 58.6 % packet Take 1 packet by mouth daily.     No current facility-administered medications on file prior to visit.        ROS:  All others reviewed and negative.  Objective        PE:  BP (!) 152/70 (BP Location: Right Arm, Patient Position: Sitting, Cuff Size: Normal)   Pulse 69   Temp 98.3 F (36.8 C) (Oral)   Ht 5\' 4"  (1.626 m)   Wt 111 lb 6.4 oz (50.5 kg)   SpO2 98%   BMI 19.12 kg/m                 Constitutional: Pt appears in NAD               HENT: Head: NCAT.                Right Ear: External ear normal.                 Left Ear: External ear  normal.                Eyes: . Pupils are equal, round, and reactive to light. Conjunctivae and EOM are normal               Nose: without d/c or deformity               Neck: Neck supple. Gross normal ROM               Cardiovascular: Normal rate and regular rhythm.                 Pulmonary/Chest: Effort normal and breath sounds without rales or wheezing.                Abd:  Soft, NT, ND, + BS, no organomegaly               Neurological: Pt is alert. At baseline orientation, motor grossly intact               Skin: Skin is warm. No rashes, no other new lesions, LE edema - none               Psychiatric: Pt behavior is normal without agitation   Micro: none  Cardiac tracings I have personally interpreted today:  none  Pertinent Radiological findings (summarize): none   Lab Results  Component Value Date   WBC 9.1 07/19/2020   HGB 13.9 07/19/2020   HCT 40.5 07/19/2020   PLT 313.0 07/19/2020   GLUCOSE 89 07/19/2020   CHOL 246 (H) 07/19/2020   TRIG 181.0 (H) 07/19/2020   HDL 92.20 07/19/2020   LDLDIRECT 126.0 07/18/2019   LDLCALC 118 (H) 07/19/2020   ALT 11 07/19/2020   AST 18 07/19/2020   NA 137 07/19/2020   K 4.0 07/19/2020   CL 101 07/19/2020   CREATININE 0.59 07/19/2020   BUN 10 07/19/2020   CO2 27 07/19/2020   TSH 2.16 07/19/2020   HGBA1C 5.8 01/29/2017   Assessment/Plan:  [name] is a 75 y.o. White or  Caucasian [1] female with  has a past medical history of Brain aneurysm, DIVERTICULOSIS, COLON (06/05/2007), GERD (06/05/2007), GOITER (03/03/2008), Hematuria, HYPERLIPIDEMIA (06/05/2007), Hypertension, Osteopenia (04/2018), and Vitamin D deficiency.  Vitamin D deficiency Last vitamin D Lab Results  Component Value Date   VD25OH 25.56 (L) 07/18/2019   Low, to start oral replacement   HTN (hypertension) BP Readings from Last 3 Encounters:  07/19/20 (!) 152/70  08/27/19 (!) 178/85  07/18/19 (!) 142/80   Stable, pt to continue medical treatment as declines med tx  change for now as BP at home < 140/90, norvasc   GERD For diet control and prevacid  Hyperlipidemia Lab Results  Component Value Date   LDLCALC 118 (H) 07/19/2020   Stable, pt to continue current low chol diet, declines statin   Smoking Counseled to quit, not ready  Arthritis Ok for celebrex prn,  to f/u any worsening symptoms or concerns  Vertigo C/w bppv - very mild, declines antivert prn  Encounter for well adult exam with abnormal findings Age and sex appropriate education and counseling updated with regular exercise and diet Referrals for preventative services - none needed Immunizations addressed - for Tdap Smoking counseling  - counseled to quit, pt not ready Evidence for depression or other mood disorder - none significant Most recent labs reviewed. I have personally reviewed and have noted: 1) the patient's medical and social history 2) The patient's current medications and supplements 3) The patient's height, weight, and BMI have been recorded in the chart   Followup: Return in about 1 year (around 07/19/2021).  Cathlean Cower, MD 07/19/2020 10:15 PM [facility]

## 2020-07-19 NOTE — Assessment & Plan Note (Addendum)
For diet control and prevacid

## 2020-07-19 NOTE — Assessment & Plan Note (Signed)
Counseled to quit, not ready

## 2020-07-19 NOTE — Assessment & Plan Note (Signed)
C/w bppv - very mild, declines antivert prn

## 2020-07-19 NOTE — Assessment & Plan Note (Signed)
Sandy for celebrex prn,  to f/u any worsening symptoms or concerns

## 2020-07-19 NOTE — Patient Instructions (Addendum)
You had the pneumovax and Tdap tetanus shots today  Please continue all other medications as before, and refills have been done if requested.  Please have the pharmacy call with any other refills you may need.  Please continue your efforts at being more active, low cholesterol diet, and weight control.  You are otherwise up to date with prevention measures today.  Please keep your appointments with your specialists as you may have planned  Please go to the LAB at the blood drawing area for the tests to be done  You will be contacted by phone if any changes need to be made immediately.  Otherwise, you will receive a letter about your results with an explanation, but please check with MyChart first.  Please remember to sign up for MyChart if you have not done so, as this will be important to you in the future with finding out test results, communicating by private email, and scheduling acute appointments online when needed.  Please make an Appointment to return for your 1 year visit, or sooner if needed

## 2020-07-19 NOTE — Assessment & Plan Note (Signed)
BP Readings from Last 3 Encounters:  07/19/20 (!) 152/70  08/27/19 (!) 178/85  07/18/19 (!) 142/80   Stable, pt to continue medical treatment as declines med tx change for now as BP at home < 140/90, norvasc

## 2020-07-19 NOTE — Assessment & Plan Note (Signed)
Last vitamin D Lab Results  Component Value Date   VD25OH 25.56 (L) 07/18/2019   Low, to start oral replacement

## 2020-07-19 NOTE — Assessment & Plan Note (Signed)
Age and sex appropriate education and counseling updated with regular exercise and diet Referrals for preventative services - none needed Immunizations addressed - for Tdap Smoking counseling  - counseled to quit, pt not ready Evidence for depression or other mood disorder - none significant Most recent labs reviewed. I have personally reviewed and have noted: 1) the patient's medical and social history 2) The patient's current medications and supplements 3) The patient's height, weight, and BMI have been recorded in the chart

## 2020-07-19 NOTE — Assessment & Plan Note (Signed)
Lab Results  Component Value Date   LDLCALC 118 (H) 07/19/2020   Stable, pt to continue current low chol diet, declines statin

## 2020-07-20 ENCOUNTER — Encounter: Payer: Self-pay | Admitting: Internal Medicine

## 2020-07-21 DIAGNOSIS — H26492 Other secondary cataract, left eye: Secondary | ICD-10-CM | POA: Diagnosis not present

## 2020-08-02 ENCOUNTER — Other Ambulatory Visit: Payer: Self-pay

## 2020-09-08 ENCOUNTER — Other Ambulatory Visit: Payer: Self-pay | Admitting: Internal Medicine

## 2020-09-09 NOTE — Telephone Encounter (Signed)
Please refill as per office routine med refill policy (all routine meds refilled for 3 mo or monthly per pt preference up to one year from last visit, then month to month grace period for 3 mo, then further med refills will have to be denied)  

## 2020-11-08 ENCOUNTER — Telehealth: Payer: Self-pay | Admitting: Internal Medicine

## 2020-11-08 NOTE — Telephone Encounter (Signed)
1.Medication Requested: estradiol (VIVELLE-DOT) 0.05 MG/24HR patch  2. Pharmacy (Name, Street, [place]): [facility] [address]  Phone:  [phone] Fax:  [phone]   3. On Med List: yes  4. Last Visit with PCP: 05.23.22  5. Next visit date with PCP: 05.25.23   Agent: Please be advised that RX refills may take up to 3 business days. We ask that you follow-up with your pharmacy.

## 2020-11-16 DIAGNOSIS — Z1231 Encounter for screening mammogram for malignant neoplasm of breast: Secondary | ICD-10-CM | POA: Diagnosis not present

## 2020-12-20 ENCOUNTER — Other Ambulatory Visit: Payer: Self-pay | Admitting: *Deleted

## 2020-12-20 NOTE — Telephone Encounter (Signed)
Patient overdue for annual exam.

## 2020-12-30 ENCOUNTER — Ambulatory Visit (INDEPENDENT_AMBULATORY_CARE_PROVIDER_SITE_OTHER): Payer: Medicare HMO

## 2020-12-30 DIAGNOSIS — Z0000 Encounter for general adult medical examination without abnormal findings: Secondary | ICD-10-CM

## 2020-12-30 NOTE — Patient Instructions (Signed)
Ms. Shimmel , Thank you for taking time to come for your Medicare Wellness Visit. I appreciate your ongoing commitment to your health goals. Please review the following plan we discussed and let me know if I can assist you in the future.   Screening recommendations/referrals: Colonoscopy: 08/27/2019; due every 10 years Mammogram: 11/2020; due every 1-2 years Bone Density: 05/01/2018; normal results Recommended yearly ophthalmology/optometry visit for glaucoma screening and checkup Recommended yearly dental visit for hygiene and checkup  Vaccinations: Influenza vaccine: declined Pneumococcal vaccine: 04/28/2014, 07/19/2020 Tdap vaccine: 07/19/2020; due every 10 years Shingles vaccine: never done; can check with local pharmacy   Covid-19: declined  Advanced directives: Advance directive discussed with you today. Even though you declined this today please call our office should you change your mind and we can give you the proper paperwork for you to fill out.  Conditions/risks identified: Yes; Client understands the importance of follow-up with providers by attending scheduled visits and discussed goals to eat healthier, increase physical activity, exercise the brain, socialize more, get enough sleep and make time for laughter.  Next appointment: Please schedule your next Medicare Wellness Visit with your Nurse Health Advisor in 1 year by calling [phone].   Preventive Care 23 Years and Older, Female Preventive care refers to lifestyle choices and visits with your health care provider that can promote health and wellness. What does preventive care include? A yearly physical exam. This is also called an annual well check. Dental exams once or twice a year. Routine eye exams. Ask your health care provider how often you should have your eyes checked. Personal lifestyle choices, including: Daily care of your teeth and gums. Regular physical activity. Eating a healthy diet. Avoiding tobacco and  drug use. Limiting alcohol use. Practicing safe sex. Taking low-dose aspirin every day. Taking vitamin and mineral supplements as recommended by your health care provider. What happens during an annual well check? The services and screenings done by your health care provider during your annual well check will depend on your age, overall health, lifestyle risk factors, and family history of disease. Counseling  Your health care provider may ask you questions about your: Alcohol use. Tobacco use. Drug use. Emotional well-being. Home and relationship well-being. Sexual activity. Eating habits. History of falls. Memory and ability to understand (cognition). Work and work Statistician. Reproductive health. Screening  You may have the following tests or measurements: Height, weight, and BMI. Blood pressure. Lipid and cholesterol levels. These may be checked every 5 years, or more frequently if you are over 19 years old. Skin check. Lung cancer screening. You may have this screening every year starting at age 42 if you have a 30-pack-year history of smoking and currently smoke or have quit within the past 15 years. Fecal occult blood test (FOBT) of the stool. You may have this test every year starting at age 35. Flexible sigmoidoscopy or colonoscopy. You may have a sigmoidoscopy every 5 years or a colonoscopy every 10 years starting at age 46. Hepatitis C blood test. Hepatitis B blood test. Sexually transmitted disease (STD) testing. Diabetes screening. This is done by checking your blood sugar (glucose) after you have not eaten for a while (fasting). You may have this done every 1-3 years. Bone density scan. This is done to screen for osteoporosis. You may have this done starting at age 27. Mammogram. This may be done every 1-2 years. Talk to your health care provider about how often you should have regular mammograms. Talk with your health  care provider about your test results, treatment  options, and if necessary, the need for more tests. Vaccines  Your health care provider may recommend certain vaccines, such as: Influenza vaccine. This is recommended every year. Tetanus, diphtheria, and acellular pertussis (Tdap, Td) vaccine. You may need a Td booster every 10 years. Zoster vaccine. You may need this after age 34. Pneumococcal 13-valent conjugate (PCV13) vaccine. One dose is recommended after age 35. Pneumococcal polysaccharide (PPSV23) vaccine. One dose is recommended after age 67. Talk to your health care provider about which screenings and vaccines you need and how often you need them. This information is not intended to replace advice given to you by your health care provider. Make sure you discuss any questions you have with your health care provider. Document Released: 03/12/2015 Document Revised: 11/03/2015 Document Reviewed: 12/15/2014 Elsevier Interactive Patient Education  2017 [place] Prevention in the Home Falls can cause injuries. They can happen to people of all ages. There are many things you can do to make your home safe and to help prevent falls. What can I do on the outside of my home? Regularly fix the edges of walkways and driveways and fix any cracks. Remove anything that might make you trip as you walk through a door, such as a raised step or threshold. Trim any bushes or trees on the path to your home. Use bright outdoor lighting. Clear any walking paths of anything that might make someone trip, such as rocks or tools. Regularly check to see if handrails are loose or broken. Make sure that both sides of any steps have handrails. Any raised decks and porches should have guardrails on the edges. Have any leaves, snow, or ice cleared regularly. Use sand or salt on walking paths during winter. Clean up any spills in your garage right away. This includes oil or grease spills. What can I do in the bathroom? Use night lights. Install grab  bars by the toilet and in the tub and shower. Do not use towel bars as grab bars. Use non-skid mats or decals in the tub or shower. If you need to sit down in the shower, use a plastic, non-slip stool. Keep the floor dry. Clean up any water that spills on the floor as soon as it happens. Remove soap buildup in the tub or shower regularly. Attach bath mats securely with double-sided non-slip rug tape. Do not have throw rugs and other things on the floor that can make you trip. What can I do in the bedroom? Use night lights. Make sure that you have a light by your bed that is easy to reach. Do not use any sheets or blankets that are too big for your bed. They should not hang down onto the floor. Have a firm chair that has side arms. You can use this for support while you get dressed. Do not have throw rugs and other things on the floor that can make you trip. What can I do in the kitchen? Clean up any spills right away. Avoid walking on wet floors. Keep items that you use a lot in easy-to-reach places. If you need to reach something above you, use a strong step stool that has a grab bar. Keep electrical cords out of the way. Do not use floor polish or wax that makes floors slippery. If you must use wax, use non-skid floor wax. Do not have throw rugs and other things on the floor that can make you trip. What can  I do with my stairs? Do not leave any items on the stairs. Make sure that there are handrails on both sides of the stairs and use them. Fix handrails that are broken or loose. Make sure that handrails are as long as the stairways. Check any carpeting to make sure that it is firmly attached to the stairs. Fix any carpet that is loose or worn. Avoid having throw rugs at the top or bottom of the stairs. If you do have throw rugs, attach them to the floor with carpet tape. Make sure that you have a light switch at the top of the stairs and the bottom of the stairs. If you do not have them,  ask someone to add them for you. What else can I do to help prevent falls? Wear shoes that: Do not have high heels. Have rubber bottoms. Are comfortable and fit you well. Are closed at the toe. Do not wear sandals. If you use a stepladder: Make sure that it is fully opened. Do not climb a closed stepladder. Make sure that both sides of the stepladder are locked into place. Ask someone to hold it for you, if possible. Clearly mark and make sure that you can see: Any grab bars or handrails. First and last steps. Where the edge of each step is. Use tools that help you move around (mobility aids) if they are needed. These include: Canes. Walkers. Scooters. Crutches. Turn on the lights when you go into a dark area. Replace any light bulbs as soon as they burn out. Set up your furniture so you have a clear path. Avoid moving your furniture around. If any of your floors are uneven, fix them. If there are any pets around you, be aware of where they are. Review your medicines with your doctor. Some medicines can make you feel dizzy. This can increase your chance of falling. Ask your doctor what other things that you can do to help prevent falls. This information is not intended to replace advice given to you by your health care provider. Make sure you discuss any questions you have with your health care provider. Document Released: 12/10/2008 Document Revised: 07/22/2015 Document Reviewed: 03/20/2014 Elsevier Interactive Patient Education  2017 [employer].

## 2020-12-30 NOTE — Progress Notes (Signed)
I connected with Asal Teas today by telephone and verified that I am speaking with the correct person using two identifiers. Location patient: home Location provider: work Persons participating in the virtual visit: patient, provider.   I discussed the limitations, risks, security and privacy concerns of performing an evaluation and management service by telephone and the availability of in person appointments. I also discussed with the patient that there may be a patient responsible charge related to this service. The patient expressed understanding and verbally consented to this telephonic visit.    Interactive audio and video telecommunications were attempted between this provider and patient, however failed, due to patient having technical difficulties OR patient did not have access to video capability.  We continued and completed visit with audio only.  Some vital signs may be absent or patient reported.   Time Spent with patient on telephone encounter: 40 minutes  Subjective:   Isabel Christensen is a 75 y.o. female who presents for Medicare Annual (Subsequent) preventive examination.  Review of Systems     Cardiac Risk Factors include: advanced age (>28men, >39 women);dyslipidemia;hypertension;family history of premature cardiovascular disease;smoking/ tobacco exposure     Objective:    There were no vitals filed for this visit. There is no height or weight on file to calculate BMI.  Advanced Directives 12/30/2020 10/16/2019  Does Patient Have a Medical Advance Directive? No No  Would patient like information on creating a medical advance directive? No - Patient declined No - Patient declined    Current Medications (verified) Outpatient Encounter Medications as of 12/30/2020  Medication Sig   amLODipine (NORVASC) 5 MG tablet Take 1 tablet by mouth once daily   celecoxib (CELEBREX) 200 MG capsule Take 1 capsule (200 mg total) by mouth 2 (two) times daily as needed.   estradiol  (VIVELLE-DOT) 0.05 MG/24HR patch Place 1 patch (0.05 mg total) onto the skin 2 (two) times a week.   lansoprazole (PREVACID) 15 MG capsule TAKE 1 CAPSULE EVERY DAY   Misc Natural Products (LUTEIN 20) CAPS Take 1 tablet by mouth daily.   Multiple Vitamin (MULTIVITAMIN) tablet Take 1 tablet by mouth daily.   psyllium (METAMUCIL) 58.6 % packet Take 1 packet by mouth daily.   No facility-administered encounter medications on file as of 12/30/2020.    Allergies (verified) Aspirin, Black cohosh, and Promethazine hcl   History: Past Medical History:  Diagnosis Date   Brain aneurysm    DIVERTICULOSIS, COLON 06/05/2007   Qualifier: Diagnosis of  By: Jenny Reichmann MD, Hunt Oris    GERD 06/05/2007   Qualifier: Diagnosis of  By: Elveria Royals    GOITER 03/03/2008   Qualifier: Diagnosis of  By: Jenny Reichmann MD, Hunt Oris    Hematuria    FOLLOWED BY UROLOGIST FOR THIS   HYPERLIPIDEMIA 06/05/2007   Qualifier: Diagnosis of  By: Jenny Reichmann MD, Hunt Oris    Hypertension    Osteopenia 04/2018   T score -1.4 FRAX 9.5% / 2.6%   Vitamin D deficiency    Past Surgical History:  Procedure Laterality Date   cateract surgery     COLONOSCOPY  2011   dental implant     NOSE SURGERY     OOPHORECTOMY     VAGINAL HYSTERECTOMY  1976   FOR STERILIZATION   Family History  Problem Relation Age of Onset   Diabetes Mother    Hypertension Mother    Heart disease Mother    Breast cancer Mother        Age  14   Cancer Sister        MELANOMA   Breast cancer Sister        Age 50   Cancer Sister        Melanoma,Lymphoma   Colon cancer Neg Hx    Colon polyps Neg Hx    Esophageal cancer Neg Hx    Rectal cancer Neg Hx    Stomach cancer Neg Hx    Social History   Socioeconomic History   Marital status: Married    Spouse name: Not on file   Number of children: Not on file   Years of education: 12   Highest education level: High school graduate  Occupational History   Not on file  Tobacco Use   Smoking status: Every Day     Packs/day: 0.50    Types: Cigarettes   Smokeless tobacco: Never  Vaping Use   Vaping Use: Never used  Substance and Sexual Activity   Alcohol use: Not on file   Drug use: No   Sexual activity: Yes    Birth control/protection: Surgical    Comment: 1st intercourse 75 yo-Fewer than 5 partners  Other Topics Concern   Not on file  Social History Narrative   Not on file   Social Determinants of Health   Financial Resource Strain: Low Risk    Difficulty of Paying Living Expenses: Not hard at all  Food Insecurity: No Food Insecurity   Worried About Charity fundraiser in the Last Year: Never true   [employer] in the Last Year: Never true  Transportation Needs: No Transportation Needs   Lack of Transportation (Medical): No   Lack of Transportation (Non-Medical): No  Physical Activity: Sufficiently Active   Days of Exercise per Week: 5 days   Minutes of Exercise per Session: 30 min  Stress: No Stress Concern Present   Feeling of Stress : Not at all  Social Connections: Socially Integrated   Frequency of Communication with Friends and Family: Not on file   Frequency of Social Gatherings with Friends and Family: More than three times a week   Attends Religious Services: More than 4 times per year   Active Member of [employer] or Organizations: Yes   Attends Music therapist: More than 4 times per year   Marital Status: Married    Tobacco Counseling Ready to quit: Not Answered Counseling given: Not Answered   Clinical Intake:  Pre-visit preparation completed: Yes  Pain : No/denies pain     Nutritional Risks: None Diabetes: No  How often do you need to have someone help you when you read instructions, pamphlets, or other written materials from your doctor or pharmacy?: 1 - Never What is the last grade level you completed in school?: High School Graduate  Diabetic? no  Interpreter Needed?: No  Information entered by :: Lisette Abu,  LPN.   Activities of Daily Living In your present state of health, do you have any difficulty performing the following activities: 12/30/2020  Hearing? N  Vision? N  Difficulty concentrating or making decisions? N  Walking or climbing stairs? N  Dressing or bathing? N  Doing errands, shopping? N  Preparing Food and eating ? N  Using the Toilet? N  In the past six months, have you accidently leaked urine? N  Do you have problems with loss of bowel control? N  Managing your Medications? N  Managing your Finances? N  Housekeeping or managing your Housekeeping? N  Some recent data might be hidden    Patient Care Team: [name], MD as PCP - [facility] Micah Flesher, MD as Consulting Physician (Ophthalmology)  Indicate any recent Medical Services you may have received from other than [facility] providers in the past year (date may be approximate).     Assessment:   This is a routine wellness examination for Isabel Christensen.  Hearing/Vision screen Hearing Screening - Comments:: Patient denied any hearing difficulty.   No hearing aids.  Vision Screening - Comments:: Patient wears readers.  Eye exam done annually by: Gala Romney, MD.  Dietary issues and exercise activities discussed: Current Exercise Habits: Home exercise routine, Type of exercise: walking, Time (Minutes): 30, Frequency (Times/Week): 5, Weekly Exercise (Minutes/Week): 150, Intensity: Moderate, Exercise limited by: None identified   Goals Addressed   None   Depression Screen PHQ 2/9 Scores 12/30/2020 07/19/2020 10/16/2019 07/18/2019 07/18/2019 01/24/2017 05/11/2015  PHQ - 2 Score 0 0 0 0 0 0 0    Fall Risk Fall Risk  12/30/2020 07/19/2020 07/19/2020 10/16/2019 07/18/2019  Falls in the past year? 0 0 1 0 0  Number falls in past yr: 0 0 0 0 -  Injury with Fall? 0 0 0 0 -  Risk for fall due to : No Fall Risks - - No Fall Risks -  Follow up Falls evaluation completed - - Falls evaluation completed -    FALL RISK PREVENTION  PERTAINING TO THE HOME:  Any stairs in or around the home? Yes  If so, are there any without handrails? No  Home free of loose throw rugs in walkways, pet beds, electrical cords, etc? Yes  Adequate lighting in your home to reduce risk of falls? Yes   ASSISTIVE DEVICES UTILIZED TO PREVENT FALLS:  Life alert? No  Use of a cane, walker or w/c? No  Grab bars in the bathroom? Yes  Shower chair or bench in shower? No  Elevated toilet seat or a handicapped toilet? No   TIMED UP AND GO:  Was the test performed? No .  Length of time to ambulate 10 feet: n/a sec.   Gait steady and fast without use of assistive device  Cognitive Function: Normal cognitive status assessed by direct observation by this Nurse Health Advisor. No abnormalities found.          Immunizations Immunization History  Administered Date(s) Administered   Pneumococcal Conjugate-13 04/28/2014   Pneumococcal Polysaccharide-23 07/19/2020   Td 02/13/2008   Tdap 07/19/2020   Zoster, Live 02/13/2008    TDAP status: Up to date  Flu Vaccine status: Declined, Education has been provided regarding the importance of this vaccine but patient still declined. Advised may receive this vaccine at local pharmacy or Health Dept. Aware to provide a copy of the vaccination record if obtained from local pharmacy or Health Dept. Verbalized acceptance and understanding.  Pneumococcal vaccine status: Up to date  Covid-19 vaccine status: Declined, Education has been provided regarding the importance of this vaccine but patient still declined. Advised may receive this vaccine at local pharmacy or Health Dept.or vaccine clinic. Aware to provide a copy of the vaccination record if obtained from local pharmacy or Health Dept. Verbalized acceptance and understanding.  Qualifies for Shingles Vaccine? Yes   Zostavax completed Yes   Shingrix Completed?: No.    Education has been provided regarding the importance of this vaccine. Patient has  been advised to call insurance company to determine out of pocket expense if they have not yet received  this vaccine. Advised may also receive vaccine at local pharmacy or Health Dept. Verbalized acceptance and understanding.  Screening Tests Health Maintenance  Topic Date Due   COVID-19 Vaccine (1) Never done   Zoster Vaccines- Shingrix (1 of 2) Never done   INFLUENZA VACCINE  Never done   COLONOSCOPY (Pts 45-67yrs Insurance coverage will need to be confirmed)  08/26/2029   TETANUS/TDAP  07/20/2030   Pneumonia Vaccine 49+ Years old  Completed   DEXA SCAN  Completed   Hepatitis C Screening  Completed   HPV VACCINES  Aged Out    Health Maintenance  Health Maintenance Due  Topic Date Due   COVID-19 Vaccine (1) Never done   Zoster Vaccines- Shingrix (1 of 2) Never done   INFLUENZA VACCINE  Never done    Colorectal cancer screening: Type of screening: Colonoscopy. Completed 08/27/2019. Repeat every 10 years  Mammogram status: Completed 11/2020. Repeat every year  Bone Density status: Completed 05/01/2018. Results reflect: Bone density results: NORMAL. Repeat every 0 years.  Lung Cancer Screening: (Low Dose CT Chest recommended if Age 69-80 years, 30 pack-year currently smoking OR have quit w/in 15years.) does qualify.   Lung Cancer Screening Referral: no  Additional Screening:  Hepatitis C Screening: does qualify; Completed yes  Vision Screening: Recommended annual ophthalmology exams for early detection of glaucoma and other disorders of the eye. Is the patient up to date with their annual eye exam?  Yes  Who is the provider or what is the name of the office in which the patient attends annual eye exams? Gala Romney, MD. If pt is not established with a provider, would they like to be referred to a provider to establish care? No .   Dental Screening: Recommended annual dental exams for proper oral hygiene  Community Resource Referral / Chronic Care Management: CRR required  this visit?  No   CCM required this visit?  No      Plan:     I have personally reviewed and noted the following in the patient's chart:   Medical and social history Use of alcohol, tobacco or illicit drugs  Current medications and supplements including opioid prescriptions.  Functional ability and status Nutritional status Physical activity Advanced directives List of other physicians Hospitalizations, surgeries, and ER visits in previous 12 months Vitals Screenings to include cognitive, depression, and falls Referrals and appointments  In addition, I have reviewed and discussed with patient certain preventive protocols, quality metrics, and best practice recommendations. A written personalized care plan for preventive services as well as general preventive health recommendations were provided to patient.     Sheral Flow, LPN   16/10/4501   Nurse Notes:  Patient is cogitatively intact. There were no vitals filed for this visit. There is no height or weight on file to calculate BMI. Patient stated that she has no issues with gait or balance; does not use any assistive devices. Medications reviewed with patient; no opioid use noted.

## 2021-01-13 DIAGNOSIS — Z20828 Contact with and (suspected) exposure to other viral communicable diseases: Secondary | ICD-10-CM | POA: Diagnosis not present

## 2021-01-13 DIAGNOSIS — J0100 Acute maxillary sinusitis, unspecified: Secondary | ICD-10-CM | POA: Diagnosis not present

## 2021-01-13 DIAGNOSIS — R051 Acute cough: Secondary | ICD-10-CM | POA: Diagnosis not present

## 2021-01-13 DIAGNOSIS — R0981 Nasal congestion: Secondary | ICD-10-CM | POA: Diagnosis not present

## 2021-01-17 ENCOUNTER — Other Ambulatory Visit: Payer: Self-pay | Admitting: Internal Medicine

## 2021-01-17 NOTE — Telephone Encounter (Signed)
Please refill as per office routine med refill policy (all routine meds to be refilled for 3 mo or monthly (per pt preference) up to one year from last visit, then month to month grace period for 3 mo, then further med refills will have to be denied) ? ?

## 2021-01-26 ENCOUNTER — Telehealth: Payer: Self-pay | Admitting: Internal Medicine

## 2021-01-26 NOTE — Telephone Encounter (Signed)
1.Medication Requested: estradiol (VIVELLE-DOT) 0.05 MG/24HR patch   2. Pharmacy (Name, Street, [place]): [facility], [place]  Phone:  [phone] Fax:  [phone]   3. On Med List: yes  4. Last Visit with PCP: 05.23.22  5. Next visit date with PCP: 05.25.23  **Rx was originally prescribed by patient Gynecologist but patient says she no longer sees provider**   Agent: Please be advised that RX refills may take up to 3 business days. We ask that you follow-up with your pharmacy.

## 2021-01-26 NOTE — Telephone Encounter (Signed)
I suppose I could but I really would not want to, because at some point we need to try come off the estrogen due to risk of breast cancer, which currently is about 1 in 31 american women, and is very common.  thanks

## 2021-01-26 NOTE — Telephone Encounter (Signed)
Patient wanting to know if Dr. Jenny Reichmann can refill estrogen as her GYN has moved and she does not wish to see any of the female doctors in that particular office.

## 2021-01-27 NOTE — Telephone Encounter (Signed)
Left message for patient to call back and ask to speak to [facility]

## 2021-01-27 NOTE — Telephone Encounter (Signed)
Patient notified and will set up appt with GYN

## 2021-01-28 ENCOUNTER — Other Ambulatory Visit: Payer: Self-pay

## 2021-01-28 NOTE — Telephone Encounter (Signed)
Isabel Christensen, [place] Triage Patient needs refill on patches. Aex scheduled for 03/16/21

## 2021-01-31 MED ORDER — ESTRADIOL 0.05 MG/24HR TD PTTW: 1.0000 | MEDICATED_PATCH | TRANSDERMAL | 0 refills | Status: DC

## 2021-03-04 ENCOUNTER — Telehealth: Payer: Self-pay

## 2021-03-04 NOTE — Telephone Encounter (Signed)
Received fax from patients pharmacy in regards to most recent Rx sent in for patient for vivelle dot patches. Rx sent on 01/31/21 for 14 patches to cover patient until AEX scheduled on 03/16/21. However, pharmacy is faxing to reports that they only have unbreakable boxes of patches of 8 and pt has 6 left on Rx. Faxed back with response of "Permission granted to supply patient with box of eight patches."

## 2021-03-16 ENCOUNTER — Encounter: Payer: Self-pay | Admitting: Nurse Practitioner

## 2021-03-16 ENCOUNTER — Ambulatory Visit (INDEPENDENT_AMBULATORY_CARE_PROVIDER_SITE_OTHER): Payer: Medicare HMO | Admitting: Nurse Practitioner

## 2021-03-16 ENCOUNTER — Other Ambulatory Visit: Payer: Self-pay

## 2021-03-16 VITALS — BP 136/82 | Ht 63.25 in | Wt 111.0 lb

## 2021-03-16 DIAGNOSIS — Z780 Asymptomatic menopausal state: Secondary | ICD-10-CM

## 2021-03-16 DIAGNOSIS — Z79890 Hormone replacement therapy (postmenopausal): Secondary | ICD-10-CM | POA: Diagnosis not present

## 2021-03-16 DIAGNOSIS — Z01419 Encounter for gynecological examination (general) (routine) without abnormal findings: Secondary | ICD-10-CM

## 2021-03-16 DIAGNOSIS — M85852 Other specified disorders of bone density and structure, left thigh: Secondary | ICD-10-CM | POA: Diagnosis not present

## 2021-03-16 MED ORDER — ESTRADIOL 0.05 MG/24HR TD PTTW: 1.0000 | MEDICATED_PATCH | TRANSDERMAL | 3 refills | Status: DC

## 2021-03-16 NOTE — Progress Notes (Signed)
Isabel Christensen August 08, 1945 [id number]   History:  76 y.o. G1P0010 presents for breast and pelvic exam. Postmenopausal - on ERT. S/P 1976 TVH BSO for sterilization. Has not tolerated stopping ERT in the past. Normal pap history. HTN, HLD managed by PCP.  Gynecologic History No LMP recorded. Patient has had a hysterectomy.   Contraception: status post hysterectomy Sexually active: Yes  Health Maintenance Last Pap: 2012. Results were: Normal Last mammogram: 12/2020. Results were: Normal Last colonoscopy: 08/27/2019. Results were: Tubular adenomas, 3-5 year recall Last Dexa: 05/01/2018. Results were: T-score -1.4, FRAX 9.5% / 2.6%  Past medical history, past surgical history, family history and social history were all reviewed and documented in the EPIC chart. Married. S Has stepchildren. Mother and sister diagnosed with breast cancer at age 35.   ROS:  A ROS was performed and pertinent positives and negatives are included.  Exam:  Vitals:   03/16/21 1333  BP: 136/82  Weight: 111 lb (50.3 kg)  Height: 5' 3.25" (1.607 m)   Body mass index is 19.51 kg/m.  General appearance:  Normal Thyroid:  Symmetrical, normal in size, without palpable masses or nodularity. Respiratory  Auscultation:  Clear without wheezing or rhonchi Cardiovascular  Auscultation:  Regular rate, without rubs, murmurs or gallops  Edema/varicosities:  Not grossly evident Abdominal  Soft,nontender, without masses, guarding or rebound.  Liver/spleen:  No organomegaly noted  Hernia:  None appreciated  Skin  Inspection:  Grossly normal Breasts: Examined lying and sitting.   Right: Without masses, retractions, nipple discharge or axillary adenopathy.   Left: Without masses, retractions, nipple discharge or axillary adenopathy. Genitourinary   Inguinal/mons:  Normal without inguinal adenopathy  External genitalia:  Normal appearing vulva with no masses, tenderness, or lesions  BUS/Urethra/Skene's glands:   Normal  Vagina:  Normal appearing with normal color and discharge, no lesions. Atrophic changes  Cervix:  Absent  Uterus:  Absent  Adnexa/parametria:     Rt: Normal in size, without masses or tenderness.   Lt: Normal in size, without masses or tenderness.  Anus and perineum: Normal  Digital rectal exam: Normal sphincter tone without palpated masses or tenderness  Patient informed chaperone available to be present for breast and pelvic exam. Patient has requested no chaperone to be present. Patient has been advised what will be completed during breast and pelvic exam.   Assessment/Plan:  76 y.o. G1P0010 for breast and pelvic exam.   Well female exam with routine gynecological exam - Education provided on SBEs, importance of preventative screenings, current guidelines, high calcium diet, regular exercise, and multivitamin daily. Labs with PCP.   Postmenopausal - Plan: DG Bone Density. On HRT. S/P TVH BSO.   Postmenopausal hormone therapy - Plan: estradiol (VIVELLE-DOT) 0.05 MG/24HR patch twice weekly. Has tried to wean but does not tolerate. She is aware of risk for blood clots, heart attack, stroke, and breast cancer with use. She would like to continue. Refill x 1 year provided.   Osteopenia of neck of left femur - Plan: DG Bone Density. T-score -1.4 without elevated FRAX in March 2020. Recommend Vitamin D + Calcium and regular exercise. Will repeat DXA now.   Screening for cervical cancer - Normal Pap history. No longer screening per guidelines.   Screening for breast cancer - Normal mammogram history.  Continue annual screenings.  Normal breast exam today.  Screening for colon cancer - 2021 colonoscopy - tubular adenomas. Will repeat at 3-5 year interval per GI's recommendation.   Return in 1 year for medication  follow up (ERT) and 2 years for breast and pelvic exam or sooner if needed.      [place], 1:44 PM 03/16/2021

## 2021-04-14 DIAGNOSIS — D3611 Benign neoplasm of peripheral nerves and autonomic nervous system of face, head, and neck: Secondary | ICD-10-CM | POA: Diagnosis not present

## 2021-04-14 DIAGNOSIS — L111 Transient acantholytic dermatosis [Grover]: Secondary | ICD-10-CM | POA: Diagnosis not present

## 2021-04-14 DIAGNOSIS — L821 Other seborrheic keratosis: Secondary | ICD-10-CM | POA: Diagnosis not present

## 2021-04-14 DIAGNOSIS — L570 Actinic keratosis: Secondary | ICD-10-CM | POA: Diagnosis not present

## 2021-04-14 DIAGNOSIS — D485 Neoplasm of uncertain behavior of skin: Secondary | ICD-10-CM | POA: Diagnosis not present

## 2021-04-14 DIAGNOSIS — Z85828 Personal history of other malignant neoplasm of skin: Secondary | ICD-10-CM | POA: Diagnosis not present

## 2021-04-14 DIAGNOSIS — D234 Other benign neoplasm of skin of scalp and neck: Secondary | ICD-10-CM | POA: Diagnosis not present

## 2021-04-14 DIAGNOSIS — C44319 Basal cell carcinoma of skin of other parts of face: Secondary | ICD-10-CM | POA: Diagnosis not present

## 2021-04-14 DIAGNOSIS — L812 Freckles: Secondary | ICD-10-CM | POA: Diagnosis not present

## 2021-04-26 ENCOUNTER — Other Ambulatory Visit: Payer: Self-pay | Admitting: Nurse Practitioner

## 2021-04-26 ENCOUNTER — Other Ambulatory Visit: Payer: Self-pay

## 2021-04-26 ENCOUNTER — Ambulatory Visit (INDEPENDENT_AMBULATORY_CARE_PROVIDER_SITE_OTHER): Payer: Medicare HMO

## 2021-04-26 DIAGNOSIS — Z780 Asymptomatic menopausal state: Secondary | ICD-10-CM

## 2021-04-26 DIAGNOSIS — M85852 Other specified disorders of bone density and structure, left thigh: Secondary | ICD-10-CM

## 2021-04-26 DIAGNOSIS — M85851 Other specified disorders of bone density and structure, right thigh: Secondary | ICD-10-CM

## 2021-05-17 DIAGNOSIS — C44319 Basal cell carcinoma of skin of other parts of face: Secondary | ICD-10-CM | POA: Diagnosis not present

## 2021-07-21 ENCOUNTER — Ambulatory Visit (INDEPENDENT_AMBULATORY_CARE_PROVIDER_SITE_OTHER): Payer: Medicare HMO | Admitting: Internal Medicine

## 2021-07-21 ENCOUNTER — Encounter: Payer: Self-pay | Admitting: Internal Medicine

## 2021-07-21 VITALS — BP 124/76 | HR 77 | Temp 98.0°F | Ht 63.25 in | Wt 111.0 lb

## 2021-07-21 DIAGNOSIS — E559 Vitamin D deficiency, unspecified: Secondary | ICD-10-CM | POA: Diagnosis not present

## 2021-07-21 DIAGNOSIS — R739 Hyperglycemia, unspecified: Secondary | ICD-10-CM | POA: Diagnosis not present

## 2021-07-21 DIAGNOSIS — E7800 Pure hypercholesterolemia, unspecified: Secondary | ICD-10-CM | POA: Diagnosis not present

## 2021-07-21 DIAGNOSIS — R69 Illness, unspecified: Secondary | ICD-10-CM | POA: Diagnosis not present

## 2021-07-21 DIAGNOSIS — I10 Essential (primary) hypertension: Secondary | ICD-10-CM | POA: Diagnosis not present

## 2021-07-21 DIAGNOSIS — Z0001 Encounter for general adult medical examination with abnormal findings: Secondary | ICD-10-CM | POA: Diagnosis not present

## 2021-07-21 DIAGNOSIS — M2550 Pain in unspecified joint: Secondary | ICD-10-CM

## 2021-07-21 DIAGNOSIS — F17200 Nicotine dependence, unspecified, uncomplicated: Secondary | ICD-10-CM

## 2021-07-21 DIAGNOSIS — E538 Deficiency of other specified B group vitamins: Secondary | ICD-10-CM

## 2021-07-21 LAB — LIPID PANEL
Cholesterol: 240 mg/dL — ABNORMAL HIGH (ref 0–200)
HDL: 86.1 mg/dL (ref >39.00)
LDL Cholesterol: 119 mg/dL — ABNORMAL HIGH (ref 0–99)
NonHDL: 154.07
Total CHOL/HDL Ratio: 3
Triglycerides: 175 mg/dL — ABNORMAL HIGH (ref 0.0–149.0)
VLDL: 35 mg/dL (ref 0.0–40.0)

## 2021-07-21 LAB — CBC WITH DIFFERENTIAL/PLATELET
Basophils Absolute: 0 10*3/uL (ref 0.0–0.1)
Basophils Relative: 0.6 % (ref 0.0–3.0)
Eosinophils Absolute: 0.3 10*3/uL (ref 0.0–0.7)
Eosinophils Relative: 4.2 % (ref 0.0–5.0)
HCT: 39.2 % (ref 36.0–46.0)
Hemoglobin: 13.6 g/dL (ref 12.0–15.0)
Lymphocytes Relative: 27.9 % (ref 12.0–46.0)
Lymphs Abs: 2 10*3/uL (ref 0.7–4.0)
MCHC: 34.5 g/dL (ref 30.0–36.0)
MCV: 96.4 fl (ref 78.0–100.0)
Monocytes Absolute: 0.7 10*3/uL (ref 0.1–1.0)
Monocytes Relative: 9.7 % (ref 3.0–12.0)
Neutro Abs: 4.2 10*3/uL (ref 1.4–7.7)
Neutrophils Relative %: 57.6 % (ref 43.0–77.0)
Platelets: 305 10*3/uL (ref 150.0–400.0)
RBC: 4.07 Mil/uL (ref 3.87–5.11)
RDW: 12.6 % (ref 11.5–15.5)
WBC: 7.3 10*3/uL (ref 4.0–10.5)

## 2021-07-21 LAB — URINALYSIS, ROUTINE W REFLEX MICROSCOPIC
Bilirubin Urine: NEGATIVE
Ketones, ur: NEGATIVE
Leukocytes,Ua: NEGATIVE
Nitrite: NEGATIVE
Specific Gravity, Urine: <=1.005 — AB (ref 1.000–1.030)
Total Protein, Urine: NEGATIVE
Urine Glucose: NEGATIVE
Urobilinogen, UA: 0.2 (ref 0.0–1.0)
pH: 6.5 (ref 5.0–8.0)

## 2021-07-21 LAB — BASIC METABOLIC PANEL
BUN: 12 mg/dL (ref 6–23)
CO2: 28 mEq/L (ref 19–32)
Calcium: 9.5 mg/dL (ref 8.4–10.5)
Chloride: 100 mEq/L (ref 96–112)
Creatinine, Ser: 0.6 mg/dL (ref 0.40–1.20)
GFR: 87.25 mL/min (ref >60.00)
Glucose, Bld: 87 mg/dL (ref 70–99)
Potassium: 4.2 mEq/L (ref 3.5–5.1)
Sodium: 135 mEq/L (ref 135–145)

## 2021-07-21 LAB — VITAMIN B12: Vitamin B-12: 576 pg/mL (ref 211–911)

## 2021-07-21 LAB — HEPATIC FUNCTION PANEL
ALT: 12 U/L (ref 0–35)
AST: 20 U/L (ref 0–37)
Albumin: 4.3 g/dL (ref 3.5–5.2)
Alkaline Phosphatase: 68 U/L (ref 39–117)
Bilirubin, Direct: 0.1 mg/dL (ref 0.0–0.3)
Total Bilirubin: 0.5 mg/dL (ref 0.2–1.2)
Total Protein: 6.9 g/dL (ref 6.0–8.3)

## 2021-07-21 LAB — HEMOGLOBIN A1C: Hgb A1c MFr Bld: 5.6 % (ref 4.6–6.5)

## 2021-07-21 LAB — VITAMIN D 25 HYDROXY (VIT D DEFICIENCY, FRACTURES): VITD: 30.48 ng/mL (ref 30.00–100.00)

## 2021-07-21 LAB — TSH: TSH: 2.54 u[IU]/mL (ref 0.35–5.50)

## 2021-07-21 MED ORDER — CELECOXIB 100 MG PO CAPS: 100.0000 mg | ORAL_CAPSULE | Freq: Two times a day (BID) | ORAL | 11 refills | Status: DC

## 2021-07-21 NOTE — Patient Instructions (Signed)

## 2021-07-24 ENCOUNTER — Encounter: Payer: Self-pay | Admitting: Internal Medicine

## 2021-07-24 NOTE — Assessment & Plan Note (Signed)
Improved per pt, pt asks for decreased celebrex to 100 bid prn

## 2021-07-24 NOTE — Assessment & Plan Note (Signed)
BP Readings from Last 3 Encounters:  07/21/21 124/76  03/16/21 136/82  07/19/20 (!) 152/70   Stable, pt to continue medical treatment norvasc

## 2021-07-24 NOTE — Progress Notes (Signed)
Patient ID: Isabel Christensen, female   DOB: 08/11/1945, 76 y.o.   MRN: [id number]         Chief Complaint:: wellness exam and Physical (No concerns) and Medication Refill (Would like to decrease dosage for Celebrex)  , low vit d, arthralgias, smoking hld, htn       HPI:  Isabel Christensen is a 76 y.o. female here for wellness exam; declines covid booster, shingrix, o/w up to date                        Also not taking Vit D.  Still smoking, not ready to quit.  Does not want statin for hld, still working on lower chol diet.  Pt denies chest pain, increased sob or doe, wheezing, orthopnea, PND, increased LE swelling, palpitations, dizziness or syncope.   Pt denies polydipsia, polyuria, or new focal neuro s/s.    Pt denies fever, wt loss, night sweats, loss of appetite, or other constitutional symptoms  No other new complaints   Wt Readings from Last 3 Encounters:  07/21/21 111 lb (50.3 kg)  03/16/21 111 lb (50.3 kg)  07/19/20 111 lb 6.4 oz (50.5 kg)   BP Readings from Last 3 Encounters:  07/21/21 124/76  03/16/21 136/82  07/19/20 (!) 152/70   Immunization History  Administered Date(s) Administered   Pneumococcal Conjugate-13 04/28/2014   Pneumococcal Polysaccharide-23 07/19/2020   Td 02/13/2008   Tdap 07/19/2020   Zoster, Live 02/13/2008  There are no preventive care reminders to display for this patient.    Past Medical History:  Diagnosis Date   Brain aneurysm    DIVERTICULOSIS, COLON 06/05/2007   Qualifier: Diagnosis of  By: Jenny Reichmann MD, Hunt Oris    GERD 06/05/2007   Qualifier: Diagnosis of  By: Elveria Royals    GOITER 03/03/2008   Qualifier: Diagnosis of  By: Jenny Reichmann MD, Hunt Oris    Hematuria    FOLLOWED BY UROLOGIST FOR THIS   HYPERLIPIDEMIA 06/05/2007   Qualifier: Diagnosis of  By: Jenny Reichmann MD, Hunt Oris    Hypertension    Osteopenia 04/2018   T score -1.4 FRAX 9.5% / 2.6%   Vitamin D deficiency    Past Surgical History:  Procedure Laterality Date   cateract surgery     COLONOSCOPY   2011   dental implant     NOSE SURGERY     OOPHORECTOMY     VAGINAL HYSTERECTOMY  1976   FOR STERILIZATION    reports that she has been smoking cigarettes. She has been smoking an average of .5 packs per day. She has never used smokeless tobacco. She reports current alcohol use of about 10.0 standard drinks per week. She reports that she does not use drugs. family history includes Breast cancer in her mother and sister; Cancer in her sister and sister; Diabetes in her mother; Heart disease in her mother; Hypertension in her mother. Allergies  Allergen Reactions   Aspirin     REACTION: brain aneurysm   Black Cohosh     REACTION: hives   Promethazine Hcl     REACTION: rash   Current Outpatient Medications on File Prior to Visit  Medication Sig Dispense Refill   amLODipine (NORVASC) 5 MG tablet Take 1 tablet by mouth once daily 90 tablet 0   celecoxib (CELEBREX) 200 MG capsule Take 1 capsule (200 mg total) by mouth 2 (two) times daily as needed. 60 capsule 5   estradiol (VIVELLE-DOT) 0.05 MG/24HR patch Place  1 patch (0.05 mg total) onto the skin 2 (two) times a week. 24 patch 3   lansoprazole (PREVACID) 15 MG capsule TAKE 1 CAPSULE EVERY DAY 90 capsule 3   Misc Natural Products (LUTEIN 20) CAPS Take 1 tablet by mouth daily.     Multiple Vitamin (MULTIVITAMIN) tablet Take 1 tablet by mouth daily.     psyllium (METAMUCIL) 58.6 % packet Take 1 packet by mouth daily.     No current facility-administered medications on file prior to visit.        ROS:  All others reviewed and negative.  Objective        PE:  BP 124/76 (BP Location: Right Arm, Patient Position: Sitting, Cuff Size: Large)   Pulse 77   Temp 98 F (36.7 C) (Oral)   Ht 5' 3.25" (1.607 m)   Wt 111 lb (50.3 kg)   SpO2 94%   BMI 19.51 kg/m                 Constitutional: Pt appears in NAD               HENT: Head: NCAT.                Right Ear: External ear normal.                 Left Ear: External ear normal.                 Eyes: . Pupils are equal, round, and reactive to light. Conjunctivae and EOM are normal               Nose: without d/c or deformity               Neck: Neck supple. Gross normal ROM               Cardiovascular: Normal rate and regular rhythm.                 Pulmonary/Chest: Effort normal and breath sounds without rales or wheezing.                Abd:  Soft, NT, ND, + BS, no organomegaly               Neurological: Pt is alert. At baseline orientation, motor grossly intact               Skin: Skin is warm. No rashes, no other new lesions, LE edema - none               Psychiatric: Pt behavior is normal without agitation   Micro: none  Cardiac tracings I have personally interpreted today:  none  Pertinent Radiological findings (summarize): none   Lab Results  Component Value Date   WBC 7.3 07/21/2021   HGB 13.6 07/21/2021   HCT 39.2 07/21/2021   PLT 305.0 07/21/2021   GLUCOSE 87 07/21/2021   CHOL 240 (H) 07/21/2021   TRIG 175.0 (H) 07/21/2021   HDL 86.10 07/21/2021   LDLDIRECT 126.0 07/18/2019   LDLCALC 119 (H) 07/21/2021   ALT 12 07/21/2021   AST 20 07/21/2021   NA 135 07/21/2021   K 4.2 07/21/2021   CL 100 07/21/2021   CREATININE 0.60 07/21/2021   BUN 12 07/21/2021   CO2 28 07/21/2021   TSH 2.54 07/21/2021   HGBA1C 5.6 07/21/2021   Assessment/Plan:  Isabel Christensen is a 76 y.o. White or Caucasian [1] female with  has  a past medical history of Brain aneurysm, DIVERTICULOSIS, COLON (06/05/2007), GERD (06/05/2007), GOITER (03/03/2008), Hematuria, HYPERLIPIDEMIA (06/05/2007), Hypertension, Osteopenia (04/2018), and Vitamin D deficiency.  Vitamin D deficiency Last vitamin D Lab Results  Component Value Date   VD25OH 30.48 07/21/2021   Low, to start oral replacement   Encounter for well adult exam with abnormal findings Age and sex appropriate education and counseling updated with regular exercise and diet Referrals for preventative services - none  needed Immunizations addressed - declines covid booster, shingrix Smoking counseling  - none needed Evidence for depression or other mood disorder - none significant Most recent labs reviewed. I have personally reviewed and have noted: 1) the patient's medical and social history 2) The patient's current medications and supplements 3) The patient's height, weight, and BMI have been recorded in the chart   Smoking Pt counsled to quit, pt not ready  Polyarthralgia Improved per pt, pt asks for decreased celebrex to 100 bid prn  Hyperlipidemia Lab Results  Component Value Date   LDLCALC 119 (H) 07/21/2021   Uncontrolled, goal ldl < 100, pt to continue current low chol diet, declines statin   HTN (hypertension) BP Readings from Last 3 Encounters:  07/21/21 124/76  03/16/21 136/82  07/19/20 (!) 152/70   Stable, pt to continue medical treatment norvasc  Followup: Return in about 1 year (around 07/22/2022).  Cathlean Cower, MD 07/24/2021 9:24 AM [facility]

## 2021-07-24 NOTE — Assessment & Plan Note (Signed)
Last vitamin D Lab Results  Component Value Date   VD25OH 30.48 07/21/2021   Low, to start oral replacement

## 2021-07-24 NOTE — Assessment & Plan Note (Signed)

## 2021-07-24 NOTE — Assessment & Plan Note (Signed)
Pt counsled to quit, pt not ready °

## 2021-07-24 NOTE — Assessment & Plan Note (Signed)
Lab Results  Component Value Date   LDLCALC 119 (H) 07/21/2021   Uncontrolled, goal ldl < 100, pt to continue current low chol diet, declines statin

## 2021-07-27 NOTE — Progress Notes (Signed)
Printed and mail to pt 07/27/21.Marland KitchenJohny Christensen

## 2021-09-28 DIAGNOSIS — M25561 Pain in right knee: Secondary | ICD-10-CM | POA: Diagnosis not present

## 2021-11-02 DIAGNOSIS — H6691 Otitis media, unspecified, right ear: Secondary | ICD-10-CM | POA: Diagnosis not present

## 2021-11-02 DIAGNOSIS — Z20822 Contact with and (suspected) exposure to covid-19: Secondary | ICD-10-CM | POA: Diagnosis not present

## 2021-11-02 DIAGNOSIS — I10 Essential (primary) hypertension: Secondary | ICD-10-CM | POA: Diagnosis not present

## 2021-11-02 DIAGNOSIS — J019 Acute sinusitis, unspecified: Secondary | ICD-10-CM | POA: Diagnosis not present

## 2021-11-03 DIAGNOSIS — B029 Zoster without complications: Secondary | ICD-10-CM | POA: Diagnosis not present

## 2021-11-13 ENCOUNTER — Other Ambulatory Visit: Payer: Self-pay | Admitting: Internal Medicine

## 2021-11-13 NOTE — Telephone Encounter (Signed)
Please refill as per office routine med refill policy (all routine meds to be refilled for 3 mo or monthly (per pt preference) up to one year from last visit, then month to month grace period for 3 mo, then further med refills will have to be denied) ? ?

## 2021-11-17 ENCOUNTER — Ambulatory Visit (INDEPENDENT_AMBULATORY_CARE_PROVIDER_SITE_OTHER): Payer: Medicare HMO | Admitting: Internal Medicine

## 2021-11-17 VITALS — BP 128/66 | HR 69 | Temp 98.7°F | Ht 63.0 in | Wt 109.0 lb

## 2021-11-17 DIAGNOSIS — I10 Essential (primary) hypertension: Secondary | ICD-10-CM

## 2021-11-17 DIAGNOSIS — F17200 Nicotine dependence, unspecified, uncomplicated: Secondary | ICD-10-CM

## 2021-11-17 DIAGNOSIS — R69 Illness, unspecified: Secondary | ICD-10-CM | POA: Diagnosis not present

## 2021-11-17 DIAGNOSIS — B0229 Other postherpetic nervous system involvement: Secondary | ICD-10-CM | POA: Diagnosis not present

## 2021-11-17 DIAGNOSIS — E559 Vitamin D deficiency, unspecified: Secondary | ICD-10-CM | POA: Diagnosis not present

## 2021-11-17 MED ORDER — GABAPENTIN 100 MG PO CAPS: 100.0000 mg | ORAL_CAPSULE | Freq: Three times a day (TID) | ORAL | 3 refills | Status: DC

## 2021-11-17 NOTE — Patient Instructions (Signed)
Please take all new medication as prescribed - the gabapentin 100 mg three times per day  Please call if you need increased gabapentin after 1 week to the 200 or 300 mg  Please continue all other medications as before, and refills have been done if requested.  Please have the pharmacy call with any other refills you may need.  Please keep your appointments with your specialists as you may have planned  Please make an Appointment to return in May 2024 as planned, or sooner if needed

## 2021-11-17 NOTE — Progress Notes (Signed)
Patient ID: Isabel Christensen, female   DOB: 1945/12/31, 76 y.o.   MRN: [id number]        Chief Complaint: follow up shingles outbreak       HPI:  Isabel Christensen is a 76 y.o. female here after shingles outbreak on sept 5, primarily to right face with S, about the right ear and scalp, dx per dermatology. Tx wit valtrex x 7 days, and tylenol prn.  Pain persists, still mod to occasionally severe icepick like stabbing, worse to lie on right side.  Pt denies chest pain, increased sob or doe, wheezing, orthopnea, PND, increased LE swelling, palpitations, dizziness or syncope.   Pt denies polydipsia, polyuria, or new focal neuro s/s.  Stll smoking, not ready to quit.        Wt Readings from Last 3 Encounters:  11/17/21 109 lb (49.4 kg)  07/21/21 111 lb (50.3 kg)  03/16/21 111 lb (50.3 kg)   BP Readings from Last 3 Encounters:  11/17/21 128/66  07/21/21 124/76  03/16/21 136/82         Past Medical History:  Diagnosis Date   Brain aneurysm    DIVERTICULOSIS, COLON 06/05/2007   Qualifier: Diagnosis of  By: Jenny Reichmann MD, Hunt Oris    GERD 06/05/2007   Qualifier: Diagnosis of  By: Elveria Royals    GOITER 03/03/2008   Qualifier: Diagnosis of  By: Jenny Reichmann MD, Hunt Oris    Hematuria    FOLLOWED BY UROLOGIST FOR THIS   HYPERLIPIDEMIA 06/05/2007   Qualifier: Diagnosis of  By: Jenny Reichmann MD, Hunt Oris    Hypertension    Osteopenia 04/2018   T score -1.4 FRAX 9.5% / 2.6%   Vitamin D deficiency    Past Surgical History:  Procedure Laterality Date   cateract surgery     COLONOSCOPY  2011   dental implant     NOSE SURGERY     OOPHORECTOMY     VAGINAL HYSTERECTOMY  1976   FOR STERILIZATION    reports that she has been smoking cigarettes. She has been smoking an average of .5 packs per day. She has never used smokeless tobacco. She reports current alcohol use of about 10.0 standard drinks of alcohol per week. She reports that she does not use drugs. family history includes Breast cancer in her mother and sister; Cancer  in her sister and sister; Diabetes in her mother; Heart disease in her mother; Hypertension in her mother. Allergies  Allergen Reactions   Aspirin     REACTION: brain aneurysm   Black Cohosh     REACTION: hives   Promethazine Hcl     REACTION: rash   Current Outpatient Medications on File Prior to Visit  Medication Sig Dispense Refill   amLODipine (NORVASC) 5 MG tablet Take 1 tablet by mouth once daily 90 tablet 0   celecoxib (CELEBREX) 100 MG capsule Take 1 capsule (100 mg total) by mouth 2 (two) times daily. 30 capsule 11   estradiol (VIVELLE-DOT) 0.05 MG/24HR patch Place 1 patch (0.05 mg total) onto the skin 2 (two) times a week. 24 patch 3   lansoprazole (PREVACID) 15 MG capsule TAKE 1 CAPSULE EVERY DAY 90 capsule 3   Misc Natural Products (LUTEIN 20) CAPS Take 1 tablet by mouth daily.     Multiple Vitamin (MULTIVITAMIN) tablet Take 1 tablet by mouth daily.     psyllium (METAMUCIL) 58.6 % packet Take 1 packet by mouth daily.     No current facility-administered medications on file prior  to visit.        ROS:  All others reviewed and negative.  Objective        PE:  BP 128/66 (BP Location: Left Arm, Patient Position: Sitting, Cuff Size: Large)   Pulse 69   Temp 98.7 F (37.1 C) (Oral)   Ht '5\' 3"'$  (1.6 m)   Wt 109 lb (49.4 kg)   SpO2 96%   BMI 19.31 kg/m                 Constitutional: Pt appears in NAD               HENT: Head: NCAT.                Right Ear: External ear normal.                 Left Ear: External ear normal.                Eyes: . Pupils are equal, round, and reactive to light. Conjunctivae and EOM are normal               Nose: without d/c or deformity               Neck: Neck supple. Gross normal ROM               Cardiovascular: Normal rate and regular rhythm.                 Pulmonary/Chest: Effort normal and breath sounds without rales or wheezing.                Abd:  Soft, NT, ND, + BS, no organomegaly               Neurological: Pt is alert.  At baseline orientation, motor grossly intact               Skin: Skin is warm. No rashes, no other new lesions, LE edema - none               Psychiatric: Pt behavior is normal without agitation   Micro: none  Cardiac tracings I have personally interpreted today:  none  Pertinent Radiological findings (summarize): none   Lab Results  Component Value Date   WBC 7.3 07/21/2021   HGB 13.6 07/21/2021   HCT 39.2 07/21/2021   PLT 305.0 07/21/2021   GLUCOSE 87 07/21/2021   CHOL 240 (H) 07/21/2021   TRIG 175.0 (H) 07/21/2021   HDL 86.10 07/21/2021   LDLDIRECT 126.0 07/18/2019   LDLCALC 119 (H) 07/21/2021   ALT 12 07/21/2021   AST 20 07/21/2021   NA 135 07/21/2021   K 4.2 07/21/2021   CL 100 07/21/2021   CREATININE 0.60 07/21/2021   BUN 12 07/21/2021   CO2 28 07/21/2021   TSH 2.54 07/21/2021   HGBA1C 5.6 07/21/2021   Assessment/Plan:  Isabel Christensen is a 76 y.o. White or Caucasian [1] female with  has a past medical history of Brain aneurysm, DIVERTICULOSIS, COLON (06/05/2007), GERD (06/05/2007), GOITER (03/03/2008), Hematuria, HYPERLIPIDEMIA (06/05/2007), Hypertension, Osteopenia (04/2018), and Vitamin D deficiency.  PHN (postherpetic neuralgia) With persistent at least mod to severe pain, for gabapentin 100 mg tid, consider increase as needed,  to f/u any worsening symptoms or concerns   HTN (hypertension) BP Readings from Last 3 Encounters:  11/17/21 128/66  07/21/21 124/76  03/16/21 136/82   Stable, pt to continue medical treatment norvasc 5 mg qd  Smoking Pt counseled to quit, pt not ready  Vitamin D deficiency Last vitamin D Lab Results  Component Value Date   VD25OH 30.48 07/21/2021   Low, reminded to start oral replacement  Followup: Return if symptoms worsen or fail to improve.  Cathlean Cower, MD 11/20/2021 10:32 AM [facility]

## 2021-11-20 ENCOUNTER — Encounter: Payer: Self-pay | Admitting: Internal Medicine

## 2021-11-20 NOTE — Assessment & Plan Note (Signed)
BP Readings from Last 3 Encounters:  11/17/21 128/66  07/21/21 124/76  03/16/21 136/82   Stable, pt to continue medical treatment norvasc 5 mg qd

## 2021-11-20 NOTE — Assessment & Plan Note (Signed)
Pt counseled to quit, pt not ready 

## 2021-11-20 NOTE — Assessment & Plan Note (Signed)
Last vitamin D Lab Results  Component Value Date   VD25OH 30.48 07/21/2021   Low, reminded to start oral replacement

## 2021-11-20 NOTE — Assessment & Plan Note (Signed)
With persistent at least mod to severe pain, for gabapentin 100 mg tid, consider increase as needed,  to f/u any worsening symptoms or concerns

## 2021-12-22 ENCOUNTER — Telehealth: Payer: Self-pay | Admitting: Internal Medicine

## 2021-12-22 NOTE — Telephone Encounter (Signed)
Declined AWV because PCP told her that she did not have to do AWV if she does not want to. Please discuss with patient.

## 2022-02-11 ENCOUNTER — Other Ambulatory Visit: Payer: Self-pay | Admitting: Internal Medicine

## 2022-02-12 NOTE — Telephone Encounter (Signed)
Please refill as per office routine med refill policy (all routine meds to be refilled for 3 mo or monthly (per pt preference) up to one year from last visit, then month to month grace period for 3 mo, then further med refills will have to be denied) ? ?

## 2022-02-16 DIAGNOSIS — Z1231 Encounter for screening mammogram for malignant neoplasm of breast: Secondary | ICD-10-CM | POA: Diagnosis not present

## 2022-02-23 ENCOUNTER — Encounter: Payer: Self-pay | Admitting: Nurse Practitioner

## 2022-03-06 ENCOUNTER — Telehealth: Payer: Self-pay

## 2022-03-06 NOTE — Telephone Encounter (Signed)
Very sorry, we do not normally order testing recommended by other physicians.

## 2022-03-07 ENCOUNTER — Ambulatory Visit: Payer: Medicare Other | Admitting: Nurse Practitioner

## 2022-03-07 ENCOUNTER — Telehealth: Payer: Self-pay

## 2022-03-07 ENCOUNTER — Encounter: Payer: Self-pay | Admitting: Nurse Practitioner

## 2022-03-07 VITALS — BP 118/74 | HR 72 | Ht 63.25 in | Wt 113.0 lb

## 2022-03-07 DIAGNOSIS — R928 Other abnormal and inconclusive findings on diagnostic imaging of breast: Secondary | ICD-10-CM

## 2022-03-07 DIAGNOSIS — M85852 Other specified disorders of bone density and structure, left thigh: Secondary | ICD-10-CM | POA: Diagnosis not present

## 2022-03-07 DIAGNOSIS — Z79890 Hormone replacement therapy (postmenopausal): Secondary | ICD-10-CM | POA: Diagnosis not present

## 2022-03-07 MED ORDER — ESTRADIOL 0.05 MG/24HR TD PTTW: 1.0000 | MEDICATED_PATCH | TRANSDERMAL | 0 refills | Status: DC

## 2022-03-07 NOTE — Telephone Encounter (Signed)
Left message to call with [facility] medical records dept. I was asking what would be the simplest way to get films for this 77 yo patient.

## 2022-03-07 NOTE — Progress Notes (Signed)
   Isabel Christensen [birth year] [id number]   History:  77 y.o. G1P0010 presents for medication management. Postmenopausal - on ERT. Has tried to wean in the past but did not tolerate due to hot flashes. Complains of vaginal dryness and pain with intercourse. Wants to know if she can increase dose of hormones and use silicone lubricant and Vit E suppositories. S/P 1976 [facility] BSO for sterilization. 03/06/2021 left breast ultrasound showed suspicious microcalcifications with recommended biopsy. She wants this done locally in [place]. Currently at [facility] in [place].   Gynecologic History No LMP recorded. Patient has had a hysterectomy.   Contraception: status post hysterectomy Sexually active: Yes  Health Maintenance Last Pap: 2012. Results were: Normal Last mammogram: 02/16/2022. Results were: Left breast calcifications and indeterminate mass @ 6 o'clock. Ultrasound 03/06/2021 showed suspicious microcalcifications, mass benign. Biopsy recommended Last colonoscopy: 08/27/2019. Results were: Tubular adenomas, 3-5 year recall Last Dexa: 04/26/2021. Results were: T-score -1.2, FRAX 9.3% / 2.8%  Past medical history, past surgical history, family history and social history were all reviewed and documented in the EPIC chart. Married. 2 stepchildren, 1 great granddaughter. Mother and sister diagnosed with breast cancer at age 68.   ROS:  A ROS was performed and pertinent positives and negatives are included.  Exam:  Vitals:   03/07/22 1008  BP: 118/74  Pulse: 72  SpO2: 99%  Weight: 113 lb (51.3 kg)  Height: 5' 3.25" (1.607 m)    Body mass index is 19.86 kg/m.  General appearance:  Normal  Assessment/Plan:  77 y.o. G1P0010 for medication management.    Postmenopausal hormone therapy - Plan: estradiol (VIVELLE-DOT) 0.05 MG/24HR patch twice weekly. Has tried to wean but does not tolerate due to hot flashes. She is aware of risk for blood clots, heart attack, stroke, and breast cancer with use. She  would like to continue. Currently being investigated for left breast calcifications. We did discuss continuation of HRT use dependent on results of breast biopsy. She is very worried about hot flashes. Also briefly discussed option for [place].   Osteopenia of neck of left femur - T-score -1.31 March 2021. Very active. Continue Vit D and Calcium.   Abnormal mammogram of left breast - 02/16/2022 left breast calcifications and indeterminate mass @ 6 o'clock. Ultrasound 03/06/2021 showed suspicious microcalcifications, mass benign. Biopsy recommended. She would like [place] to transfer her care to a local breast imaging center. She is currently at [facility] in [place].   Return in 1 year for breast and pelvic exam.      Tamela Gammon DNP, 10:30 AM 03/07/2022

## 2022-03-07 NOTE — Telephone Encounter (Signed)
Isabel Gammon, NP  [facility] Triage  Breast biopsy recommended as of 03/06/2021. Currently being seen at [facility] in [facility] and would like any further testing and treatment to be done locally at [facility] as this is too far for her to drive.

## 2022-03-08 NOTE — Telephone Encounter (Signed)
I spoke with Robin in Med Recs at [facility].  She asked me to fax her copies of patient's breast imaging at [facility] and I have faxed twice today but still no confirmation received.  Will continue to try.  She is going to electronically retrieve the films and they will call patient and schedule stereotactic breast biopsy that is recommended. I faxed order today for the biopsy.

## 2022-03-09 NOTE — Telephone Encounter (Signed)
I spoke with Isabel Christensen in [place] again and she has obtained the films from [facility] and has emailed the scheduler she can proceed with scheduling.  I called the patient and left message in voice mail that all arrangements have been made and someone from [facility] will be calling her to schedule.

## 2022-03-10 ENCOUNTER — Other Ambulatory Visit: Payer: Self-pay | Admitting: Nurse Practitioner

## 2022-03-10 DIAGNOSIS — R921 Mammographic calcification found on diagnostic imaging of breast: Secondary | ICD-10-CM

## 2022-03-16 ENCOUNTER — Other Ambulatory Visit: Payer: Self-pay | Admitting: Nurse Practitioner

## 2022-03-16 ENCOUNTER — Ambulatory Visit
Admission: RE | Admit: 2022-03-16 | Discharge: 2022-03-16 | Disposition: A | Discharge status: Home / Self Care | Payer: Medicare Other | Source: Ambulatory Visit | Attending: Nurse Practitioner | Admitting: Nurse Practitioner

## 2022-03-16 DIAGNOSIS — R921 Mammographic calcification found on diagnostic imaging of breast: Secondary | ICD-10-CM

## 2022-03-16 HISTORY — PX: BREAST BIOPSY: SHX20

## 2022-03-20 ENCOUNTER — Ambulatory Visit: Payer: Self-pay | Admitting: General Surgery

## 2022-03-20 DIAGNOSIS — D0512 Intraductal carcinoma in situ of left breast: Secondary | ICD-10-CM

## 2022-03-20 MED ORDER — KETOROLAC TROMETHAMINE 15 MG/ML IJ SOLN: 15.0000 mg | Freq: Once | INTRAMUSCULAR | Status: AC

## 2022-03-21 ENCOUNTER — Telehealth: Payer: Self-pay

## 2022-03-21 ENCOUNTER — Other Ambulatory Visit: Payer: Self-pay | Admitting: General Surgery

## 2022-03-21 ENCOUNTER — Telehealth: Payer: Self-pay | Admitting: Radiation Oncology

## 2022-03-21 DIAGNOSIS — D0512 Intraductal carcinoma in situ of left breast: Secondary | ICD-10-CM

## 2022-03-21 NOTE — Telephone Encounter (Signed)
LVMTCB

## 2022-03-21 NOTE — Telephone Encounter (Signed)
FYI. Pt notified and voiced understanding. Pt states she has already stopped using HRT/patches but appreciated the call any ways.

## 2022-03-21 NOTE — Telephone Encounter (Signed)
-----  Message from Isabel Gammon, NP sent at 03/21/2022  9:23 AM EST ----- Regarding: HRT I just saw the report regarding breast cancer diagnosis from yesterday. She is currently on HRT. I do not see where it was recommended she stop taking. Please call to inform patient she should discontinue HRT at this time if she has not already. Thank you.

## 2022-03-21 NOTE — Progress Notes (Signed)
Radiation Oncology         [phone] ________________________________  Initial Outpatient Consultation  Name: Isabel Christensen MRN: [id number]  Date: 03/22/2022  DOB: [birth year]  ZG:YFVC, Hunt Oris, MD  Jovita Kussmaul, MD   REFERRING PHYSICIAN: Autumn Messing III, MD  DIAGNOSIS: No diagnosis found.   Cancer Staging  No matching staging information was found for the patient.  Stage 0 (cTis (DCIS), cN0, cM0) Left Breast UIQ, Intermediate to high-grade DCIS, ER+ / PR+ / Her2 not assessed  CHIEF COMPLAINT: Here to discuss management of left breast DCIS  HISTORY OF PRESENT ILLNESS::Isabel Christensen is a 77 y.o. female who presented with a left breast mass and calcifications on the following imaging: bilateral screening mammogram on the date of 02/16/22.  No symptoms, if any, were reported at that time. Diagnostic left breast mammogram showed suspicious fine pleomorphic microcalcifications in the upper inner left breast spanning approximately 2 cm, and a possible mass in the 6 o'clock left breast. Ultrasound performed on that same date showed no abnormalities in the left breast to account for mammographic findings, or evidence of left axillary lymphadenopathy.   Biopsies of the upper inner left breast calcifications and mass on date of 03/16/22 both showed intermediate to high-grade DCIS measuring 0.4 cm and 0.5 cm in the greatest linear extent of the samples (respectively), with necrosis and calcifications.  ER status: 100% positive and PR status 100% positive, both with strong staining intensity, Her2 not assessed.  The patient was accordingly referred to [name] and has opted to proceed with breast conserving surgery. Her procedure is scheduled for 04/12/22.   ***  PREVIOUS RADIATION THERAPY: No  PAST MEDICAL HISTORY:  has a past medical history of Brain aneurysm, DIVERTICULOSIS, COLON (06/05/2007), GERD (06/05/2007), GOITER (03/03/2008), Hematuria, HYPERLIPIDEMIA (06/05/2007), Hypertension, Osteopenia  (04/2018), and Vitamin D deficiency.    PAST SURGICAL HISTORY: Past Surgical History:  Procedure Laterality Date   BREAST BIOPSY Left 03/16/2022   MM LT BREAST BX W LOC DEV 1ST LESION IMAGE BX SPEC STEREO GUIDE 03/16/2022 [facility]   BREAST BIOPSY Left 03/16/2022   MM LT BREAST BX W LOC DEV EA AD LESION IMG BX SPEC STEREO GUIDE 03/16/2022 [facility]   cateract surgery     COLONOSCOPY  2011   dental implant     NOSE SURGERY     OOPHORECTOMY     VAGINAL HYSTERECTOMY  1976   FOR STERILIZATION    FAMILY HISTORY: family history includes Breast cancer in her mother and sister; Cancer in her sister and sister; Diabetes in her mother; Heart disease in her mother; Hypertension in her mother.  SOCIAL HISTORY:  reports that she has been smoking cigarettes. She has been smoking an average of .5 packs per day. She has never used smokeless tobacco. She reports current alcohol use of about 10.0 standard drinks of alcohol per week. She reports that she does not use drugs.  ALLERGIES: Aspirin, Black cohosh, and Promethazine hcl  MEDICATIONS:  Current Outpatient Medications  Medication Sig Dispense Refill   amLODipine (NORVASC) 5 MG tablet Take 1 tablet by mouth once daily 90 tablet 0   celecoxib (CELEBREX) 100 MG capsule Take 1 capsule (100 mg total) by mouth 2 (two) times daily. 30 capsule 11   estradiol (VIVELLE-DOT) 0.05 MG/24HR patch Place 1 patch (0.05 mg total) onto the skin 2 (two) times a week. 24 patch 0   lansoprazole (PREVACID) 15 MG capsule TAKE 1 CAPSULE EVERY DAY 90 capsule 3  Misc Natural Products (LUTEIN 20) CAPS Take 1 tablet by mouth daily.     Multiple Vitamin (MULTIVITAMIN) tablet Take 1 tablet by mouth daily.     psyllium (METAMUCIL) 58.6 % packet Take 1 packet by mouth daily.     No current facility-administered medications for this encounter.   Facility-Administered Medications Ordered in Other Encounters  Medication Dose Route Frequency Provider Last Rate  Last Admin   ketorolac (TORADOL) 15 MG/ML injection 15 mg  15 mg Intravenous Once Autumn Messing III, MD        REVIEW OF SYSTEMS: As above in HPI.   PHYSICAL EXAM:  vitals were not taken for this visit.   General: Alert and oriented, in no acute distress HEENT: Head is normocephalic. Extraocular movements are intact. Oropharynx is clear. Neck: Neck is supple, no palpable cervical or supraclavicular lymphadenopathy. Heart: Regular in rate and rhythm with no murmurs, rubs, or gallops. Chest: Clear to auscultation bilaterally, with no rhonchi, wheezes, or rales. Abdomen: Soft, nontender, nondistended, with no rigidity or guarding. Extremities: No cyanosis or edema. Lymphatics: see Neck Exam Skin: No concerning lesions. Musculoskeletal: symmetric strength and muscle tone throughout. Neurologic: Cranial nerves II through XII are grossly intact. No obvious focalities. Speech is fluent. Coordination is intact. Psychiatric: Judgment and insight are intact. Affect is appropriate. Breasts: *** . No other palpable masses appreciated in the breasts or axillae *** .    ECOG = ***  0 - Asymptomatic (Fully active, able to carry on all predisease activities without restriction)  1 - Symptomatic but completely ambulatory (Restricted in physically strenuous activity but ambulatory and able to carry out work of a light or sedentary nature. For example, light housework, office work)  2 - Symptomatic, <50% in bed during the day (Ambulatory and capable of all self care but unable to carry out any work activities. Up and about more than 50% of waking hours)  3 - Symptomatic, >50% in bed, but not bedbound (Capable of only limited self-care, confined to bed or chair 50% or more of waking hours)  4 - Bedbound (Completely disabled. Cannot carry on any self-care. Totally confined to bed or chair)  5 - Death   Eustace Pen MM, Creech RH, Tormey DC, et al. [phone]). "Toxicity and response criteria of the [facility]". [place] Oncol. 5 (6): 649-55   LABORATORY DATA:  Lab Results  Component Value Date   WBC 7.3 07/21/2021   HGB 13.6 07/21/2021   HCT 39.2 07/21/2021   MCV 96.4 07/21/2021   PLT 305.0 07/21/2021   CMP     Component Value Date/Time   NA 135 07/21/2021 1058   K 4.2 07/21/2021 1058   CL 100 07/21/2021 1058   CO2 28 07/21/2021 1058   GLUCOSE 87 07/21/2021 1058   BUN 12 07/21/2021 1058   CREATININE 0.60 07/21/2021 1058   CALCIUM 9.5 07/21/2021 1058   PROT 6.9 07/21/2021 1058   ALBUMIN 4.3 07/21/2021 1058   AST 20 07/21/2021 1058   ALT 12 07/21/2021 1058   ALKPHOS 68 07/21/2021 1058   BILITOT 0.5 07/21/2021 1058   GFRNONAA >90 03/03/2012 1337   GFRAA >90 03/03/2012 1337         RADIOGRAPHY: MM LT BREAST BX W LOC DEV 1ST LESION IMAGE BX SPEC STEREO GUIDE  Addendum Date: 03/21/2022   ADDENDUM REPORT: 03/21/2022 13:50 ADDENDUM: Pathology revealed INTERMEDIATE TO HIGH GRADE DUCTAL CARCINOMA IN SITU, NECROSIS: PRESENT, CALCIFICATIONS: PRESENT of the LEFT breast, upper inner anterior, (  ribbon clip). This was found to be concordant by Dr. Lovey Newcomer. Pathology revealed INTERMEDIATE TO HIGH GRADE DUCTAL CARCINOMA IN SITU, NECROSIS: PRESENT, CALCIFICATIONS: PRESENT of the LEFT breast, upper inner posterior, (x clip). This was found to be concordant by Dr. Lovey Newcomer. Pathology results were discussed with the patient by telephone. The patient reported doing well after the biopsies with tenderness and bruising at the sites. Post biopsy instructions and care were reviewed and questions were answered. The patient was encouraged to call [facility] for any additional concerns. My direct phone number was provided. Surgical consultation has been arranged with Dr. Autumn Messing at [facility] on March 20, 2022. Pathology results reported by Terie Purser, RN on 03/17/2022. Electronically Signed   By: Lovey Newcomer M.D.   On: 03/21/2022  13:50   Result Date: 03/21/2022 CLINICAL DATA:  Indeterminate left breast calcifications EXAM: LEFT BREAST STEREOTACTIC CORE NEEDLE BIOPSY COMPARISON:  Previous exam(s). FINDINGS: The patient and I discussed the procedure of stereotactic-guided biopsy including benefits and alternatives. We discussed the high likelihood of a successful procedure. We discussed the risks of the procedure including infection, bleeding, tissue injury, clip migration, and inadequate sampling. Informed written consent was given. The usual time out protocol was performed immediately prior to the procedure. Site 1: Upper inner left breast anterior (ribbon) Using sterile technique and 1% Lidocaine as local anesthetic, under stereotactic guidance, a 9 gauge vacuum assisted device was used to perform core needle biopsy of calcifications upper inner left breast using a cranial approach. Specimen radiograph was performed showing . Specimens with calcifications are identified for pathology. Lesion quadrant: Upper inner quadrant At the conclusion of the procedure, ribbon shaped tissue marker clip was deployed into the biopsy cavity. Follow-up 2-view mammogram was performed and dictated separately. Site 2: Upper inner left breast posterior (X) Using sterile technique and 1% Lidocaine as local anesthetic, under stereotactic guidance, a 9 gauge vacuum assisted device was used to perform core needle biopsy of calcifications upper inner left breast using a cranial approach. Specimen radiograph was performed showing calcifications. Specimens with calcifications are identified for pathology. Lesion quadrant: Upper inner quadrant At the conclusion of the procedure, X shaped tissue marker clip was deployed into the biopsy cavity. Follow-up 2-view mammogram was performed and dictated separately. IMPRESSION: Stereotactic-guided biopsy of left breast calcifications. No apparent complications. Electronically Signed: By: Lovey Newcomer M.D. On: 03/16/2022 12:55    MM LT BREAST BX W LOC DEV EA AD LESION IMG BX SPEC STEREO GUIDE  Addendum Date: 03/21/2022   ADDENDUM REPORT: 03/21/2022 13:50 ADDENDUM: Pathology revealed INTERMEDIATE TO HIGH GRADE DUCTAL CARCINOMA IN SITU, NECROSIS: PRESENT, CALCIFICATIONS: PRESENT of the LEFT breast, upper inner anterior, (ribbon clip). This was found to be concordant by Dr. Lovey Newcomer. Pathology revealed INTERMEDIATE TO HIGH GRADE DUCTAL CARCINOMA IN SITU, NECROSIS: PRESENT, CALCIFICATIONS: PRESENT of the LEFT breast, upper inner posterior, (x clip). This was found to be concordant by Dr. Lovey Newcomer. Pathology results were discussed with the patient by telephone. The patient reported doing well after the biopsies with tenderness and bruising at the sites. Post biopsy instructions and care were reviewed and questions were answered. The patient was encouraged to call [facility] for any additional concerns. My direct phone number was provided. Surgical consultation has been arranged with Dr. Autumn Messing at [facility] on March 20, 2022. Pathology results reported by Terie Purser, RN on 03/17/2022. Electronically Signed   By: Dian Situ  Rosana Hoes M.D.   On: 03/21/2022 13:50   Result Date: 03/21/2022 CLINICAL DATA:  Indeterminate left breast calcifications EXAM: LEFT BREAST STEREOTACTIC CORE NEEDLE BIOPSY COMPARISON:  Previous exam(s). FINDINGS: The patient and I discussed the procedure of stereotactic-guided biopsy including benefits and alternatives. We discussed the high likelihood of a successful procedure. We discussed the risks of the procedure including infection, bleeding, tissue injury, clip migration, and inadequate sampling. Informed written consent was given. The usual time out protocol was performed immediately prior to the procedure. Site 1: Upper inner left breast anterior (ribbon) Using sterile technique and 1% Lidocaine as local anesthetic, under stereotactic guidance, a 9 gauge vacuum  assisted device was used to perform core needle biopsy of calcifications upper inner left breast using a cranial approach. Specimen radiograph was performed showing . Specimens with calcifications are identified for pathology. Lesion quadrant: Upper inner quadrant At the conclusion of the procedure, ribbon shaped tissue marker clip was deployed into the biopsy cavity. Follow-up 2-view mammogram was performed and dictated separately. Site 2: Upper inner left breast posterior (X) Using sterile technique and 1% Lidocaine as local anesthetic, under stereotactic guidance, a 9 gauge vacuum assisted device was used to perform core needle biopsy of calcifications upper inner left breast using a cranial approach. Specimen radiograph was performed showing calcifications. Specimens with calcifications are identified for pathology. Lesion quadrant: Upper inner quadrant At the conclusion of the procedure, X shaped tissue marker clip was deployed into the biopsy cavity. Follow-up 2-view mammogram was performed and dictated separately. IMPRESSION: Stereotactic-guided biopsy of left breast calcifications. No apparent complications. Electronically Signed: By: Lovey Newcomer M.D. On: 03/16/2022 12:55   MM CLIP PLACEMENT LEFT  Result Date: 03/16/2022 CLINICAL DATA:  Status post stereo biopsy left breast calcifications. EXAM: 3D DIAGNOSTIC LEFT MAMMOGRAM POST STEREOTACTIC BIOPSY COMPARISON:  Previous exam(s). FINDINGS: 3D Mammographic images were obtained following stereotactic guided biopsy of left breast calcifications, 2 sites. Site 1: Upper inner left breast anterior: Ribbon clip: In appropriate position. Site 2: Upper inner left breast posterior: X clip: Approximate 5 mm inferior migration. IMPRESSION: Biopsy marking clips as above. Final Assessment: Post Procedure Mammograms for Marker Placement Electronically Signed   By: Lovey Newcomer M.D.   On: 03/16/2022 13:04     IMPRESSION/PLAN: ***   It was a pleasure meeting the  patient today. We discussed the risks, benefits, and side effects of radiotherapy. I recommend radiotherapy to the *** to reduce her risk of locoregional recurrence by 2/3.  We discussed that radiation would take approximately *** weeks to complete and that I would give the patient a few weeks to heal following surgery before starting treatment planning. *** If chemotherapy were to be given, this would precede radiotherapy. We spoke about acute effects including skin irritation and fatigue as well as much less common late effects including internal organ injury or irritation. We spoke about the latest technology that is used to minimize the risk of late effects for patients undergoing radiotherapy to the breast or chest wall. No guarantees of treatment were given. The patient is enthusiastic about proceeding with treatment. I look forward to participating in the patient's care.  I will await her referral back to me for postoperative follow-up and eventual CT simulation/treatment planning.  On date of service, in total, I spent *** minutes on this encounter. Patient was seen in person.   __________________________________________   Eppie Gibson, MD  This document serves as a record of services personally performed by Eppie Gibson, MD. It was  created on her behalf by Roney Mans, a trained medical scribe. The creation of this record is based on the scribe's personal observations and the provider's statements to them. This document has been checked and approved by the attending provider.

## 2022-03-21 NOTE — Progress Notes (Signed)
Location of Breast Cancer:  Ductal carcinoma in situ (DCIS) of left breas   Histology per Pathology Report:  03/16/2022 1. Breast, left, needle core biopsy, upper inner anterior, ribbon clip - DUCTAL CARCINOMA IN SITU, INTERMEDIATE TO HIGH GRADE - NECROSIS: PRESENT - CALCIFICATIONS: PRESENT - DCIS LENGTH: 0.4 CM 2. Breast, left, needle core biopsy, upper inner posterior, X clip - DUCTAL CARCINOMA IN SITU, INTERMEDIATE TO HIGH GRADE - NECROSIS: PRESENT - CALCIFICATIONS: PRESENT - DCIS LENGTH: 0.5 CM  Receptor Status: ER(100%), PR (100%)  Did patient present with symptoms (if so, please note symptoms) or was this found on screening mammography?: per Dr. Ethlyn Gallery 03/20/22 note: "recently went for a routine screening mammogram. At that time she was found to have 2 cm of calcification in the upper inner quadrant of the left breast."  Past/Anticipated interventions by surgeon, if any:  03/20/2022 --Dr. Autumn Messing (office visit) The patient appears to have a 2 cm area of ductal carcinoma in situ in the upper inner quadrant of the left breast.  I have discussed with her in detail the different options for treatment and at this point she favors breast conservation which I feel is very reasonable.  She would not need a node evaluation.  I have discussed with her in detail the risk and benefits of the operation as well as some of the technical aspects including the use of a radioactive seed to bracket the area for localization and she understands and wishes to proceed.  We will go ahead and refer her to medical and radiation oncology to discuss adjuvant therapy.  I will also send her to genetics given her family history.  We will proceed with surgery scheduling.   Past/Anticipated interventions by medical oncology, if any:  Scheduled for consultation with Dr. [name] on 04/03/2022  Lymphedema issues, if any:  Denies    Pain issues, if any:  Reports tenderness at biopsy site   SAFETY  ISSUES: Prior radiation? No Pacemaker/ICD? No Possible current pregnancy? No--hysterectomy  Is the patient on methotrexate? No  Current Complaints / other details:  Nothing else of note

## 2022-03-21 NOTE — Telephone Encounter (Signed)
1/23 @ 9:43 am Left voicemail for patient to call our office to be schedule for an consult.

## 2022-03-22 ENCOUNTER — Ambulatory Visit
Admission: RE | Admit: 2022-03-22 | Discharge: 2022-03-22 | Disposition: A | Discharge status: Home / Self Care | Payer: Medicare Other | Source: Ambulatory Visit | Attending: Radiation Oncology | Admitting: Radiation Oncology

## 2022-03-22 ENCOUNTER — Encounter: Payer: Self-pay | Admitting: Radiation Oncology

## 2022-03-22 ENCOUNTER — Other Ambulatory Visit: Payer: Self-pay

## 2022-03-22 VITALS — BP 140/78 | HR 75 | Temp 97.7°F | Resp 18 | Ht 63.25 in | Wt 110.8 lb

## 2022-03-22 DIAGNOSIS — Z8669 Personal history of other diseases of the nervous system and sense organs: Secondary | ICD-10-CM | POA: Diagnosis not present

## 2022-03-22 DIAGNOSIS — Z791 Long term (current) use of non-steroidal anti-inflammatories (NSAID): Secondary | ICD-10-CM | POA: Diagnosis not present

## 2022-03-22 DIAGNOSIS — E785 Hyperlipidemia, unspecified: Secondary | ICD-10-CM | POA: Diagnosis not present

## 2022-03-22 DIAGNOSIS — E559 Vitamin D deficiency, unspecified: Secondary | ICD-10-CM | POA: Insufficient documentation

## 2022-03-22 DIAGNOSIS — K219 Gastro-esophageal reflux disease without esophagitis: Secondary | ICD-10-CM | POA: Insufficient documentation

## 2022-03-22 DIAGNOSIS — Z170 Estrogen receptor positive status [ER+]: Secondary | ICD-10-CM | POA: Insufficient documentation

## 2022-03-22 DIAGNOSIS — Z803 Family history of malignant neoplasm of breast: Secondary | ICD-10-CM | POA: Diagnosis not present

## 2022-03-22 DIAGNOSIS — D0512 Intraductal carcinoma in situ of left breast: Secondary | ICD-10-CM | POA: Insufficient documentation

## 2022-03-22 DIAGNOSIS — Z79899 Other long term (current) drug therapy: Secondary | ICD-10-CM | POA: Insufficient documentation

## 2022-03-22 DIAGNOSIS — F17210 Nicotine dependence, cigarettes, uncomplicated: Secondary | ICD-10-CM | POA: Diagnosis not present

## 2022-03-22 DIAGNOSIS — Z809 Family history of malignant neoplasm, unspecified: Secondary | ICD-10-CM | POA: Diagnosis not present

## 2022-03-22 DIAGNOSIS — I10 Essential (primary) hypertension: Secondary | ICD-10-CM | POA: Insufficient documentation

## 2022-03-22 DIAGNOSIS — M8580 Other specified disorders of bone density and structure, unspecified site: Secondary | ICD-10-CM | POA: Diagnosis not present

## 2022-04-03 ENCOUNTER — Inpatient Hospital Stay: Payer: Medicare Other | Attending: Hematology and Oncology | Admitting: Hematology and Oncology

## 2022-04-03 ENCOUNTER — Other Ambulatory Visit: Payer: Medicare Other

## 2022-04-03 VITALS — BP 162/88 | HR 74 | Temp 97.3°F | Resp 19 | Wt 111.6 lb

## 2022-04-03 DIAGNOSIS — D0512 Intraductal carcinoma in situ of left breast: Secondary | ICD-10-CM | POA: Diagnosis present

## 2022-04-03 DIAGNOSIS — Z808 Family history of malignant neoplasm of other organs or systems: Secondary | ICD-10-CM | POA: Insufficient documentation

## 2022-04-03 DIAGNOSIS — F17210 Nicotine dependence, cigarettes, uncomplicated: Secondary | ICD-10-CM | POA: Diagnosis not present

## 2022-04-03 DIAGNOSIS — Z79899 Other long term (current) drug therapy: Secondary | ICD-10-CM | POA: Insufficient documentation

## 2022-04-03 DIAGNOSIS — Z803 Family history of malignant neoplasm of breast: Secondary | ICD-10-CM | POA: Diagnosis not present

## 2022-04-03 NOTE — Assessment & Plan Note (Signed)
03/16/22: Screening mammogram detected pleomorphic microcalcifications in the upper inner left breast 6 to 8 cm from the nipple in a loosely grouped/somewhat segmental distribution spanning 2 cm. Left Breast Biopsy UIQ anterior and Posterior: [facility] DCIS Er 100%, PR 100%  Pathology review: I discussed with the patient the difference between DCIS and invasive breast cancer. It is considered a precancerous lesion. DCIS is classified as a 0. It is generally detected through mammograms as calcifications. We discussed the significance of grades and its impact on prognosis. We also discussed the importance of ER and PR receptors and their implications to adjuvant treatment options. Prognosis of DCIS dependence on grade, comedo necrosis. It is anticipated that if not treated, 20-30% of DCIS can develop into invasive breast cancer.  Recommendation: 1. Breast conserving surgery 2. Followed by adjuvant radiation therapy 3. Followed by antiestrogen therapy with tamoxifen 5 years  Tamoxifen counseling: We discussed the risks and benefits of tamoxifen. These include but not limited to insomnia, hot flashes, mood changes, vaginal dryness, and weight gain. Although rare, serious side effects including endometrial cancer, risk of blood clots were also discussed. We strongly believe that the benefits far outweigh the risks. Patient understands these risks and consented to starting treatment. Planned treatment duration is 5 years.  Return to clinic after surgery to discuss the final pathology report and come up with an adjuvant treatment plan.

## 2022-04-03 NOTE — Telephone Encounter (Signed)
Imaging and biopsy was performed 03/16/22.  Patient has surgeon appt 04/12/22.

## 2022-04-03 NOTE — Progress Notes (Signed)
[place] NOTE  Patient Care Team: [name], MD as PCP - [facility] Micah Flesher, MD as Consulting Physician (Ophthalmology)  CHIEF COMPLAINTS/PURPOSE OF CONSULTATION:  Newly diagnosed left breast DCIS  HISTORY OF PRESENTING ILLNESS:  Isabel Christensen 77 y.o. female is here because of recent diagnosis of left breast DCIS.  Patient had routine screening mammogram that detected pleomorphic microcalcifications in the left breast upper inner quadrant measuring 2 cm span.  Biopsy revealed intermediate grade high-grade DCIS that was ER/PR positive.  She has been referred to [place] for discussion regarding adjuvant treatment options for DCIS.  I reviewed her records extensively and collaborated the history with the patient.  SUMMARY OF ONCOLOGIC HISTORY: Oncology History  Ductal carcinoma in situ (DCIS) of left breast  03/16/2022 Initial Diagnosis   Screening mammogram detected pleomorphic microcalcifications in the upper inner left breast 6 to 8 cm from the nipple in a loosely grouped/somewhat segmental distribution spanning 2 cm. Left Breast Biopsy UIQ anterior and Posterior: IG-HG DCIS Er 100%, PR 100%   04/03/2022 Cancer Staging   Staging form: Breast, AJCC 8th Edition - Clinical: Stage 0 (cTis (DCIS), cN0, cM0, G3, ER+, PR+, HER2: Not Assessed) - Signed by [name], MD on 04/03/2022 Stage prefix: Initial diagnosis Histologic grading system: 3 grade system      MEDICAL HISTORY:  Past Medical History:  Diagnosis Date   Brain aneurysm    DIVERTICULOSIS, COLON 06/05/2007   Qualifier: Diagnosis of  By: Jenny Reichmann MD, Hunt Oris    GERD 06/05/2007   Qualifier: Diagnosis of  By: Elveria Royals    GOITER 03/03/2008   Qualifier: Diagnosis of  By: Jenny Reichmann MD, Hunt Oris    Hematuria    FOLLOWED BY UROLOGIST FOR THIS   HYPERLIPIDEMIA 06/05/2007   Qualifier: Diagnosis of  By: Jenny Reichmann MD, Hunt Oris    Hypertension    Osteopenia 04/2018   T score -1.4 FRAX 9.5% / 2.6%   Vitamin D  deficiency     SURGICAL HISTORY: Past Surgical History:  Procedure Laterality Date   BREAST BIOPSY Left 03/16/2022   MM LT BREAST BX W LOC DEV 1ST LESION IMAGE BX SPEC STEREO GUIDE 03/16/2022 [facility]   BREAST BIOPSY Left 03/16/2022   MM LT BREAST BX W LOC DEV EA AD LESION IMG BX SPEC STEREO GUIDE 03/16/2022 [facility]   cateract surgery     COLONOSCOPY  2011   dental implant     NOSE SURGERY     OOPHORECTOMY     VAGINAL HYSTERECTOMY  1976   FOR STERILIZATION    SOCIAL HISTORY: Social History   Socioeconomic History   Marital status: Married    Spouse name: Not on file   Number of children: Not on file   Years of education: 12   Highest education level: High school graduate  Occupational History   Not on file  Tobacco Use   Smoking status: Every Day    Packs/day: 0.50    Types: Cigarettes   Smokeless tobacco: Never  Vaping Use   Vaping Use: Never used  Substance and Sexual Activity   Alcohol use: Yes    Alcohol/week: 10.0 standard drinks of alcohol    Types: 10 Standard drinks or equivalent per week   Drug use: No   Sexual activity: Yes    Birth control/protection: Surgical    Comment: Hyst, First IC >45 y/o, <5 Partners  Other Topics Concern   Not on file  Social History  Narrative   Not on file   Social Determinants of Health   Financial Resource Strain: Low Risk  (12/30/2020)   Overall Financial Resource Strain (CARDIA)    Difficulty of Paying Living Expenses: Not hard at all  Food Insecurity: No Food Insecurity (12/30/2020)   Hunger Vital Sign    Worried About Running Out of Food in the Last Year: Never true    Ran Out of Food in the Last Year: Never true  Transportation Needs: No Transportation Needs (12/30/2020)   PRAPARE - Hydrologist (Medical): No    Lack of Transportation (Non-Medical): No  Physical Activity: Sufficiently Active (12/30/2020)   Exercise Vital Sign    Days of Exercise per Week: 5 days     Minutes of Exercise per Session: 30 min  Stress: No Stress Concern Present (12/30/2020)   [place]    Feeling of Stress : Not at all  Social Connections: [place] (12/30/2020)   Social Connection and Isolation Panel [NHANES]    Frequency of Communication with Friends and Family: Not on file    Frequency of Social Gatherings with Friends and Family: More than three times a week    Attends Religious Services: More than 4 times per year    Active Member of [employer] or Organizations: Yes    Attends Music therapist: More than 4 times per year    Marital Status: Married  Human resources officer Violence: Not At Risk (12/30/2020)   Humiliation, Afraid, Rape, and Kick questionnaire    Fear of Current or Ex-Partner: No    Emotionally Abused: No    Physically Abused: No    Sexually Abused: No    FAMILY HISTORY: Family History  Problem Relation Age of Onset   Diabetes Mother    Hypertension Mother    Heart disease Mother    Breast cancer Mother        Age 80   Cancer Sister        MELANOMA   Breast cancer Sister        Age 53   Cancer Sister        Melanoma,Lymphoma   Colon cancer Neg Hx    Colon polyps Neg Hx    Esophageal cancer Neg Hx    Rectal cancer Neg Hx    Stomach cancer Neg Hx     ALLERGIES:  is allergic to aspirin, black cohosh, and promethazine hcl.  MEDICATIONS:  Current Outpatient Medications  Medication Sig Dispense Refill   amLODipine (NORVASC) 5 MG tablet Take 1 tablet by mouth once daily 90 tablet 0   celecoxib (CELEBREX) 100 MG capsule Take 1 capsule (100 mg total) by mouth 2 (two) times daily. 30 capsule 11   lansoprazole (PREVACID) 15 MG capsule TAKE 1 CAPSULE EVERY DAY 90 capsule 3   Misc Natural Products (LUTEIN 20) CAPS Take 1 tablet by mouth daily.     Multiple Vitamin (MULTIVITAMIN) tablet Take 1 tablet by mouth daily.     psyllium (METAMUCIL) 58.6 % packet Take 1 packet by  mouth daily.     No current facility-administered medications for this visit.    REVIEW OF SYSTEMS:   Constitutional: Denies fevers, chills or abnormal night sweats Breast:  Denies any palpable lumps or discharge All other systems were reviewed with the patient and are negative.  PHYSICAL EXAMINATION: ECOG PERFORMANCE STATUS: 1 - Symptomatic but completely ambulatory  Vitals:   04/03/22 1304  BP: (!) 162/88  Pulse: 74  Resp: 19  Temp: (!) 97.3 F (36.3 C)  SpO2: 100%   Filed Weights   04/03/22 1304  Weight: 111 lb 9 oz (50.6 kg)    GENERAL:alert, no distress and comfortable    LABORATORY DATA:  I have reviewed the data as listed Lab Results  Component Value Date   WBC 7.3 07/21/2021   HGB 13.6 07/21/2021   HCT 39.2 07/21/2021   MCV 96.4 07/21/2021   PLT 305.0 07/21/2021   Lab Results  Component Value Date   NA 135 07/21/2021   K 4.2 07/21/2021   CL 100 07/21/2021   CO2 28 07/21/2021    RADIOGRAPHIC STUDIES: I have personally reviewed the radiological reports and agreed with the findings in the report.  ASSESSMENT AND PLAN:  Ductal carcinoma in situ (DCIS) of left breast 03/16/22: Screening mammogram detected pleomorphic microcalcifications in the upper inner left breast 6 to 8 cm from the nipple in a loosely grouped/somewhat segmental distribution spanning 2 cm. Left Breast Biopsy UIQ anterior and Posterior: IG-HG DCIS Er 100%, PR 100%  Pathology review: I discussed with the patient the difference between DCIS and invasive breast cancer. It is considered a precancerous lesion. DCIS is classified as a 0. It is generally detected through mammograms as calcifications. We discussed the significance of grades and its impact on prognosis. We also discussed the importance of ER and PR receptors and their implications to adjuvant treatment options. Prognosis of DCIS dependence on grade, comedo necrosis. It is anticipated that if not treated, 20-30% of DCIS can develop  into invasive breast cancer.  Recommendation: 1. Breast conserving surgery 2. Followed by adjuvant radiation therapy 3. Followed by antiestrogen therapy with tamoxifen 5 years  Tamoxifen counseling: We discussed the risks and benefits of tamoxifen. These include but not limited to insomnia, hot flashes, mood changes, vaginal dryness, and weight gain. Although rare, serious side effects including endometrial cancer, risk of blood clots were also discussed. We strongly believe that the benefits far outweigh the risks. Patient understands these risks and consented to starting treatment. Planned treatment duration is 5 years.  Patient has genetics appointment coming up and she does not want to do it because she does not have any children and has had a full hysterectomy and does not think it would help her in any particular way.  Plus she is about an hour away and therefore she also does not want to make the trip.  Return to clinic after surgery to discuss the final pathology report and come up with an adjuvant treatment plan. We can do either a telephone visit or in person visit   All questions were answered. The patient knows to call the clinic with any problems, questions or concerns.    Harriette Ohara, MD 04/03/22

## 2022-04-04 ENCOUNTER — Encounter: Payer: Self-pay | Admitting: *Deleted

## 2022-04-04 DIAGNOSIS — D0512 Intraductal carcinoma in situ of left breast: Secondary | ICD-10-CM

## 2022-04-05 ENCOUNTER — Encounter (HOSPITAL_BASED_OUTPATIENT_CLINIC_OR_DEPARTMENT_OTHER): Payer: Self-pay | Admitting: General Surgery

## 2022-04-05 ENCOUNTER — Other Ambulatory Visit: Payer: Self-pay

## 2022-04-06 ENCOUNTER — Encounter (HOSPITAL_BASED_OUTPATIENT_CLINIC_OR_DEPARTMENT_OTHER)
Admission: RE | Admit: 2022-04-06 | Discharge: 2022-04-06 | Disposition: A | Discharge status: Home / Self Care | Payer: Medicare Other | Source: Ambulatory Visit | Attending: General Surgery | Admitting: General Surgery

## 2022-04-06 ENCOUNTER — Other Ambulatory Visit: Payer: Self-pay

## 2022-04-06 DIAGNOSIS — Z803 Family history of malignant neoplasm of breast: Secondary | ICD-10-CM | POA: Diagnosis not present

## 2022-04-06 DIAGNOSIS — F17210 Nicotine dependence, cigarettes, uncomplicated: Secondary | ICD-10-CM | POA: Diagnosis not present

## 2022-04-06 DIAGNOSIS — Z79899 Other long term (current) drug therapy: Secondary | ICD-10-CM | POA: Diagnosis not present

## 2022-04-06 DIAGNOSIS — D0512 Intraductal carcinoma in situ of left breast: Secondary | ICD-10-CM | POA: Diagnosis not present

## 2022-04-06 NOTE — Progress Notes (Signed)

## 2022-04-11 ENCOUNTER — Ambulatory Visit
Admission: RE | Admit: 2022-04-11 | Discharge: 2022-04-11 | Disposition: A | Discharge status: Home / Self Care | Payer: Medicare Other | Source: Ambulatory Visit | Attending: General Surgery | Admitting: General Surgery

## 2022-04-11 DIAGNOSIS — D0512 Intraductal carcinoma in situ of left breast: Secondary | ICD-10-CM

## 2022-04-11 HISTORY — PX: BREAST BIOPSY: SHX20

## 2022-04-12 ENCOUNTER — Ambulatory Visit (HOSPITAL_BASED_OUTPATIENT_CLINIC_OR_DEPARTMENT_OTHER): Payer: Medicare Other | Admitting: Anesthesiology

## 2022-04-12 ENCOUNTER — Ambulatory Visit
Admission: RE | Admit: 2022-04-12 | Discharge: 2022-04-12 | Disposition: A | Discharge status: Home / Self Care | Payer: Medicare Other | Source: Ambulatory Visit | Attending: General Surgery | Admitting: General Surgery

## 2022-04-12 ENCOUNTER — Encounter (HOSPITAL_BASED_OUTPATIENT_CLINIC_OR_DEPARTMENT_OTHER): Payer: Self-pay | Admitting: General Surgery

## 2022-04-12 ENCOUNTER — Ambulatory Visit (HOSPITAL_BASED_OUTPATIENT_CLINIC_OR_DEPARTMENT_OTHER)
Admission: RE | Admit: 2022-04-12 | Discharge: 2022-04-12 | Disposition: A | Discharge status: Home / Self Care | Payer: Medicare Other | Attending: General Surgery | Admitting: General Surgery

## 2022-04-12 ENCOUNTER — Encounter (HOSPITAL_BASED_OUTPATIENT_CLINIC_OR_DEPARTMENT_OTHER)
Admission: RE | Disposition: A | Discharge status: Home / Self Care | Payer: Self-pay | Source: Home / Self Care | Attending: General Surgery

## 2022-04-12 ENCOUNTER — Other Ambulatory Visit: Payer: Self-pay

## 2022-04-12 DIAGNOSIS — Z803 Family history of malignant neoplasm of breast: Secondary | ICD-10-CM | POA: Diagnosis not present

## 2022-04-12 DIAGNOSIS — D0512 Intraductal carcinoma in situ of left breast: Secondary | ICD-10-CM | POA: Insufficient documentation

## 2022-04-12 DIAGNOSIS — I10 Essential (primary) hypertension: Secondary | ICD-10-CM | POA: Insufficient documentation

## 2022-04-12 DIAGNOSIS — F17210 Nicotine dependence, cigarettes, uncomplicated: Secondary | ICD-10-CM | POA: Diagnosis not present

## 2022-04-12 DIAGNOSIS — Z01818 Encounter for other preprocedural examination: Secondary | ICD-10-CM

## 2022-04-12 DIAGNOSIS — Z809 Family history of malignant neoplasm, unspecified: Secondary | ICD-10-CM | POA: Insufficient documentation

## 2022-04-12 DIAGNOSIS — K219 Gastro-esophageal reflux disease without esophagitis: Secondary | ICD-10-CM | POA: Insufficient documentation

## 2022-04-12 DIAGNOSIS — E785 Hyperlipidemia, unspecified: Secondary | ICD-10-CM | POA: Insufficient documentation

## 2022-04-12 HISTORY — PX: BREAST LUMPECTOMY WITH RADIOACTIVE SEED LOCALIZATION: SHX6424

## 2022-04-12 SURGERY — BREAST LUMPECTOMY WITH RADIOACTIVE SEED LOCALIZATION
Anesthesia: General | Site: Breast | Laterality: Left

## 2022-04-12 MED ORDER — ONDANSETRON HCL 4 MG/2ML IJ SOLN
INTRAMUSCULAR | Status: DC | PRN
  Administered 2022-04-12: 4 mg via INTRAVENOUS

## 2022-04-12 MED ORDER — BUPIVACAINE-EPINEPHRINE (PF) 0.25% -1:200000 IJ SOLN
INTRAMUSCULAR | Status: DC | PRN
  Administered 2022-04-12: 20 mL via PERINEURAL

## 2022-04-12 MED ORDER — LIDOCAINE HCL (CARDIAC) PF 100 MG/5ML IV SOSY
PREFILLED_SYRINGE | INTRAVENOUS | Status: DC | PRN
  Administered 2022-04-12: 60 mg via INTRAVENOUS

## 2022-04-12 MED ORDER — ACETAMINOPHEN 500 MG PO TABS
ORAL_TABLET | ORAL | Status: AC
  Filled 2022-04-12: qty 2

## 2022-04-12 MED ORDER — DEXAMETHASONE SODIUM PHOSPHATE 4 MG/ML IJ SOLN
INTRAMUSCULAR | Status: DC | PRN
  Administered 2022-04-12: 4 mg via INTRAVENOUS

## 2022-04-12 MED ORDER — PROPOFOL 500 MG/50ML IV EMUL
INTRAVENOUS | Status: DC | PRN
  Administered 2022-04-12: 25 ug/kg/min via INTRAVENOUS

## 2022-04-12 MED ORDER — AMISULPRIDE (ANTIEMETIC) 5 MG/2ML IV SOLN: 10.0000 mg | Freq: Once | INTRAVENOUS | Status: DC | PRN

## 2022-04-12 MED ORDER — GABAPENTIN 100 MG PO CAPS
100.0000 mg | ORAL_CAPSULE | ORAL | Status: AC
  Administered 2022-04-12: 100 mg via ORAL

## 2022-04-12 MED ORDER — PROPOFOL 10 MG/ML IV BOLUS
INTRAVENOUS | Status: DC | PRN
  Administered 2022-04-12: 120 mg via INTRAVENOUS

## 2022-04-12 MED ORDER — CEFAZOLIN SODIUM-DEXTROSE 2-4 GM/100ML-% IV SOLN
2.0000 g | INTRAVENOUS | Status: AC
  Administered 2022-04-12: 2 g via INTRAVENOUS

## 2022-04-12 MED ORDER — ACETAMINOPHEN 500 MG PO TABS: 1000.0000 mg | ORAL_TABLET | Freq: Once | ORAL | Status: DC

## 2022-04-12 MED ORDER — FENTANYL CITRATE (PF) 100 MCG/2ML IJ SOLN: 25.0000 ug | INTRAMUSCULAR | Status: DC | PRN

## 2022-04-12 MED ORDER — CHLORHEXIDINE GLUCONATE CLOTH 2 % EX PADS: 6.0000 | MEDICATED_PAD | Freq: Once | CUTANEOUS | Status: DC

## 2022-04-12 MED ORDER — OXYCODONE HCL 5 MG/5ML PO SOLN: 5.0000 mg | Freq: Once | ORAL | Status: DC | PRN

## 2022-04-12 MED ORDER — BUPIVACAINE HCL (PF) 0.25 % IJ SOLN
INTRAMUSCULAR | Status: AC
  Filled 2022-04-12: qty 30

## 2022-04-12 MED ORDER — FENTANYL CITRATE (PF) 100 MCG/2ML IJ SOLN
INTRAMUSCULAR | Status: DC | PRN
  Administered 2022-04-12: 25 ug via INTRAVENOUS

## 2022-04-12 MED ORDER — LACTATED RINGERS IV SOLN: INTRAVENOUS | Status: DC

## 2022-04-12 MED ORDER — OXYCODONE HCL 5 MG PO TABS: 5.0000 mg | ORAL_TABLET | Freq: Once | ORAL | Status: DC | PRN

## 2022-04-12 MED ORDER — GLYCOPYRROLATE 0.2 MG/ML IJ SOLN
INTRAMUSCULAR | Status: DC | PRN
  Administered 2022-04-12 (×2): .1 mg via INTRAVENOUS

## 2022-04-12 MED ORDER — CEFAZOLIN SODIUM-DEXTROSE 2-4 GM/100ML-% IV SOLN
INTRAVENOUS | Status: AC
  Filled 2022-04-12: qty 100

## 2022-04-12 MED ORDER — GABAPENTIN 100 MG PO CAPS
ORAL_CAPSULE | ORAL | Status: AC
  Filled 2022-04-12: qty 1

## 2022-04-12 MED ORDER — ACETAMINOPHEN 500 MG PO TABS
1000.0000 mg | ORAL_TABLET | ORAL | Status: AC
  Administered 2022-04-12: 1000 mg via ORAL

## 2022-04-12 MED ORDER — FENTANYL CITRATE (PF) 100 MCG/2ML IJ SOLN
INTRAMUSCULAR | Status: AC
  Filled 2022-04-12: qty 2

## 2022-04-12 MED ORDER — OXYCODONE HCL 5 MG PO TABS: 5.0000 mg | ORAL_TABLET | Freq: Four times a day (QID) | ORAL | 0 refills | Status: DC | PRN

## 2022-04-12 MED ORDER — EPHEDRINE SULFATE (PRESSORS) 50 MG/ML IJ SOLN
INTRAMUSCULAR | Status: DC | PRN
  Administered 2022-04-12: 7 mg via INTRAVENOUS
  Administered 2022-04-12: 10 mg via INTRAVENOUS

## 2022-04-12 SURGICAL SUPPLY — 41 items

## 2022-04-12 NOTE — Anesthesia Preprocedure Evaluation (Addendum)
Anesthesia Evaluation  Patient identified by MRN, date of birth, ID band Patient awake    Reviewed: Allergy & Precautions, NPO status , Patient's Chart, lab work & pertinent test results  History of Anesthesia Complications Negative for: history of anesthetic complications  Airway Mallampati: II  TM Distance: >3 FB Neck ROM: Full    Dental  (+) Dental Advisory Given   Pulmonary neg shortness of breath, neg sleep apnea, neg COPD, neg recent URI, Current Smoker   Pulmonary exam normal breath sounds clear to auscultation       Cardiovascular hypertension (amlodipine), Pt. on medications (-) angina (-) Past MI, (-) Cardiac Stents and (-) CABG (-) dysrhythmias  Rhythm:Regular Rate:Normal  HLD   Neuro/Psych neg Seizures Brain aneurysm    GI/Hepatic Neg liver ROS,GERD  Medicated,,diverticulosis   Endo/Other  negative endocrine ROS    Renal/GU negative Renal ROS     Musculoskeletal  (+) Arthritis ,  osteopenia   Abdominal   Peds  Hematology negative hematology ROS (+)   Anesthesia Other Findings Left breast DCIS  Reproductive/Obstetrics                             Anesthesia Physical Anesthesia Plan  ASA: 2  Anesthesia Plan: General   Post-op Pain Management:    Induction: Intravenous  PONV Risk Score and Plan: 2 and Ondansetron, Dexamethasone and Treatment may vary due to age or medical condition  Airway Management Planned: LMA  Additional Equipment:   Intra-op Plan:   Post-operative Plan: Extubation in OR  Informed Consent: I have reviewed the patients History and Physical, chart, labs and discussed the procedure including the risks, benefits and alternatives for the proposed anesthesia with the patient or authorized representative who has indicated his/her understanding and acceptance.     Dental advisory given  Plan Discussed with: CRNA and Anesthesiologist  Anesthesia  Plan Comments: (Risks of general anesthesia discussed including, but not limited to, sore throat, hoarse voice, chipped/damaged teeth, injury to vocal cords, nausea and vomiting, allergic reactions, lung infection, heart attack, stroke, and death. All questions answered. )        Anesthesia Quick Evaluation

## 2022-04-12 NOTE — Interval H&P Note (Signed)
History and Physical Interval Note:  04/12/2022 12:38 PM  Isabel Christensen  has presented today for surgery, with the diagnosis of LEFT BREAST DCIS.  The various methods of treatment have been discussed with the patient and family. After consideration of risks, benefits and other options for treatment, the patient has consented to  Procedure(s): LEFT BREAST BRACKETED LUMPECTOMY WITH RADIOACTIVE SEED LOCALIZATION (Left) as a surgical intervention.  The patient's history has been reviewed, patient examined, no change in status, stable for surgery.  I have reviewed the patient's chart and labs.  Questions were answered to the patient's satisfaction.     Autumn Messing III

## 2022-04-12 NOTE — Discharge Instructions (Addendum)

## 2022-04-12 NOTE — Op Note (Signed)
04/12/2022  1:42 PM  PATIENT:  Isabel Christensen  77 y.o. female  PRE-OPERATIVE DIAGNOSIS:  LEFT BREAST DCIS  POST-OPERATIVE DIAGNOSIS:  LEFT BREAST DCIS  PROCEDURE:  Procedure(s): LEFT BREAST BRACKETED LUMPECTOMY WITH RADIOACTIVE SEED LOCALIZATION (Left)  SURGEON:  Surgeon(s) and Role:    Jovita Kussmaul, MD - Primary  PHYSICIAN ASSISTANT:   ASSISTANTS: none   ANESTHESIA:   local and general  EBL:  5 mL   BLOOD ADMINISTERED:none  DRAINS: none   LOCAL MEDICATIONS USED:  MARCAINE     SPECIMEN:  Source of Specimen:  left breast tissue  DISPOSITION OF SPECIMEN:  PATHOLOGY  COUNTS:  YES  TOURNIQUET:  * No tourniquets in log *  DICTATION: .Dragon Dictation  After informed consent was obtained the patient was brought to the operating room and placed in the supine position on the operating table.  After adequate induction of general anesthesia the patient's left breast was prepped with ChloraPrep, allowed to dry, and draped in usual sterile manner.  An appropriate timeout was performed.  Previously 2 I-125 seeds were placed in the upper inner quadrant of the left breast to bracket an area of ductal carcinoma in situ.  The neoprobe was set to I-125 in the area of radioactivity was readily identified.  The area around this was infiltrated with quarter percent Marcaine.  A curvilinear incision was then made along the upper inner edge of the areola of the left breast with a 15 blade knife.  The incision was carried through the skin and subcutaneous tissue sharply with the electrocautery.  Dissection was then carried throughout the upper inner quadrant between the breast tissue in the subcutaneous fat and skin.  Once this dissection was well beyond the area of the cancer then a circular portion of breast tissue was excised sharply with the electrocautery around the radioactive seeds while checking the area of radioactivity frequently.  This dissection was carried all the way to the chest wall  muscle.  Once the tissue was removed it was oriented with the appropriate paint colors.  A specimen radiograph was obtained that showed the 2 clips and 2 seeds to be in the center of the specimen.  The specimen was then sent to pathology for further evaluation.  Hemostasis was achieved using Bovie electrocautery.  The wound was irrigated with saline and infiltrated with more quarter percent Marcaine.  The cavity was marked with clips.  The deep layer of the incision was then closed with layers of interrupted 3-0 Vicryl stitches.  The skin was then closed with interrupted 4-0 Monocryl subcuticular stitches.  Dermabond dressings were applied.  The patient tolerated the procedure well.  At the end of the case all needle sponge and instrument counts were correct.  The patient was then awakened and taken to recovery in stable condition.  PLAN OF CARE: Discharge to home after PACU  PATIENT DISPOSITION:  PACU - hemodynamically stable.   Delay start of Pharmacological VTE agent (>24hrs) due to surgical blood loss or risk of bleeding: not applicable

## 2022-04-12 NOTE — Transfer of Care (Signed)
Immediate Anesthesia Transfer of Care Note  Patient: Isabel Christensen  Procedure(s) Performed: LEFT BREAST BRACKETED LUMPECTOMY WITH RADIOACTIVE SEED LOCALIZATION (Left: Breast)  Patient Location: PACU  Anesthesia Type:General  Level of Consciousness: awake, alert , oriented, and patient cooperative  Airway & Oxygen Therapy: Patient Spontanous Breathing and Patient connected to face mask oxygen  Post-op Assessment: Report given to RN and Post -op Vital signs reviewed and stable  Post vital signs: Reviewed and stable  Last Vitals:  Vitals Value Taken Time  BP    Temp    Pulse 69 04/12/22 1351  Resp 21 04/12/22 1351  SpO2 96 % 04/12/22 1351  Vitals shown include unvalidated device data.  Last Pain:  Vitals:   04/12/22 1122  TempSrc: Temporal  PainSc: 0-No pain      Patients Stated Pain Goal: 3 ([id number])  Complications: No notable events documented.

## 2022-04-12 NOTE — Anesthesia Postprocedure Evaluation (Signed)
Anesthesia Post Note  Patient: Saide Kaska  Procedure(s) Performed: LEFT BREAST BRACKETED LUMPECTOMY WITH RADIOACTIVE SEED LOCALIZATION (Left: Breast)     Patient location during evaluation: PACU Anesthesia Type: General Level of consciousness: awake Pain management: pain level controlled Vital Signs Assessment: post-procedure vital signs reviewed and stable Respiratory status: spontaneous breathing, nonlabored ventilation and respiratory function stable Cardiovascular status: blood pressure returned to baseline and stable Postop Assessment: no apparent nausea or vomiting Anesthetic complications: no   No notable events documented.  Last Vitals:  Vitals:   04/12/22 1400 04/12/22 1425  BP: (!) 141/65   Pulse: 71 71  Resp: 17 16  Temp:    SpO2: 100% 98%    Last Pain:  Vitals:   04/12/22 1425  TempSrc:   PainSc: 0-No pain                 Nilda Simmer

## 2022-04-12 NOTE — H&P (Signed)
REFERRING PHYSICIAN: Tamela Gammon, NP  PROVIDER: Landry Corporal, MD  MRN: [id number] DOB: 04/07/1945 Subjective  Chief Complaint: Breast Cancer   History of Present Illness: Isabel Christensen is a 77 y.o. female who is seen today as an office consultation for evaluation of Breast Cancer .  We are asked to see the patient in consultation by Dr. Marny Lowenstein to evaluate her for a new left breast cancer. The patient is a 77 year old white female who recently went for a routine screening mammogram. At that time she was found to have 2 cm of calcification in the upper inner quadrant of the left breast. 2 representative areas were biopsied and both came back as ductal carcinoma in situ. The tumor markers have not been reported yet. She is otherwise in good health. She does smoke about a half a pack of cigarettes a day. She does have a family history of breast cancer in her mother and sister  Review of Systems: A complete review of systems was obtained from the patient. I have reviewed this information and discussed as appropriate with the patient. See HPI as well for other ROS.  Review of Systems Constitutional: Negative. HENT: Negative. Eyes: Negative. Respiratory: Negative. Cardiovascular: Negative. Gastrointestinal: Negative. Genitourinary: Negative. Musculoskeletal: Negative. Skin: Negative. Neurological: Negative. Endo/Heme/Allergies: Negative. Psychiatric/Behavioral: Negative.   Medical History: History reviewed. No pertinent past medical history.  Patient Active Problem List Diagnosis Ductal carcinoma in situ (DCIS) of left breast  Past Surgical History: Procedure Laterality Date HYSTERECTOMY   No Known Allergies  Current Outpatient Medications on File Prior to Visit Medication Sig Dispense Refill amLODIPine (NORVASC) 5 MG tablet Take 5 mg by mouth once daily estradiol (VIVELLE-DOT) patch 0.05 mg/24 hr Place 1 patch onto the skin twice a week  No current  facility-administered medications on file prior to visit.  Family History Problem Relation Age of Onset High blood pressure (Hypertension) Mother Hyperlipidemia (Elevated cholesterol) Mother Breast cancer Mother Skin cancer Sister Hyperlipidemia (Elevated cholesterol) Sister Breast cancer Sister Skin cancer Brother   Social History  Tobacco Use Smoking Status Every Day Types: Cigarettes Smokeless Tobacco Never   Social History  Socioeconomic History Marital status: Married Tobacco Use Smoking status: Every Day Types: Cigarettes Smokeless tobacco: Never  Objective:  Vitals: BP: (!) 144/78 Pulse: 77 Weight: 51.1 kg (112 lb 9.6 oz) Height: 161.9 cm (5' 3.75") PainSc: 0-No pain  Body mass index is 19.48 kg/m.  Physical Exam Vitals reviewed. Constitutional: General: She is not in acute distress. Appearance: Normal appearance. HENT: Head: Normocephalic and atraumatic. Right Ear: External ear normal. Left Ear: External ear normal. Nose: Nose normal. Mouth/Throat: Mouth: Mucous membranes are moist. Pharynx: Oropharynx is clear. Eyes: General: No scleral icterus. Extraocular Movements: Extraocular movements intact. Conjunctiva/sclera: Conjunctivae normal. Pupils: Pupils are equal, round, and reactive to light. Cardiovascular: Rate and Rhythm: Normal rate and regular rhythm. Pulses: Normal pulses. Heart sounds: Normal heart sounds. Pulmonary: Effort: Pulmonary effort is normal. No respiratory distress. Breath sounds: Normal breath sounds. Abdominal: General: Bowel sounds are normal. Palpations: Abdomen is soft. Tenderness: There is no abdominal tenderness. Musculoskeletal: General: No swelling, tenderness or deformity. Normal range of motion. Cervical back: Normal range of motion and neck supple. Skin: General: Skin is warm and dry. Coloration: Skin is not jaundiced. Neurological: General: No focal deficit present. Mental Status: She is alert and  oriented to person, place, and time. Psychiatric: Mood and Affect: Mood normal. Behavior: Behavior normal.    Breast: There is no palpable mass in either breast.  There is no palpable axillary, supraclavicular, or cervical lymphadenopathy.  Labs, Imaging and Diagnostic Testing:  Assessment and Plan:  Diagnoses and all orders for this visit:  Ductal carcinoma in situ (DCIS) of left breast - Ambulatory Referral to Oncology-Medical - Ambulatory Referral to Radiation Oncology - [facility] Case Posting Request; Future    The patient appears to have a 2 cm area of ductal carcinoma in situ in the upper inner quadrant of the left breast. I have discussed with her in detail the different options for treatment and at this point she favors breast conservation which I feel is very reasonable. She would not need a node evaluation. I have discussed with her in detail the risk and benefits of the operation as well as some of the technical aspects including the use of a radioactive seed to bracket the area for localization and she understands and wishes to proceed. We will go ahead and refer her to medical and radiation oncology to discuss adjuvant therapy. I will also send her to genetics given her family history. We will proceed with surgery scheduling.

## 2022-04-12 NOTE — Anesthesia Procedure Notes (Signed)
Procedure Name: LMA Insertion Date/Time: 04/12/2022 12:59 PM  Performed by: Ethanjames Fontenot, Ernesta Amble, CRNAPre-anesthesia Checklist: Patient identified, Emergency Drugs available, Suction available and Patient being monitored Patient Re-evaluated:Patient Re-evaluated prior to induction Oxygen Delivery Method: Circle system utilized Preoxygenation: Pre-oxygenation with 100% oxygen Induction Type: IV induction Ventilation: Mask ventilation without difficulty LMA: LMA inserted LMA Size: 4.0 Number of attempts: 1 Airway Equipment and Method: Bite block Placement Confirmation: positive ETCO2 Tube secured with: Tape Dental Injury: Teeth and Oropharynx as per pre-operative assessment

## 2022-04-13 ENCOUNTER — Encounter (HOSPITAL_BASED_OUTPATIENT_CLINIC_OR_DEPARTMENT_OTHER): Payer: Self-pay | Admitting: General Surgery

## 2022-04-14 LAB — SURGICAL PATHOLOGY

## 2022-04-20 ENCOUNTER — Encounter (HOSPITAL_COMMUNITY): Payer: Self-pay

## 2022-04-20 ENCOUNTER — Encounter: Payer: Self-pay | Admitting: *Deleted

## 2022-04-20 NOTE — Progress Notes (Signed)
Patient Care Team: [name], MD as PCP - General [facility] Micah Flesher, MD as Consulting Physician (Ophthalmology) Mauro Kaufmann, RN as Oncology Nurse Navigator Rockwell Germany, RN as Oncology Nurse Navigator [name], MD as Consulting Physician (Hematology and Oncology)  DIAGNOSIS: No diagnosis found.  SUMMARY OF ONCOLOGIC HISTORY: Oncology History  Ductal carcinoma in situ (DCIS) of left breast  03/16/2022 Initial Diagnosis   Screening mammogram detected pleomorphic microcalcifications in the upper inner left breast 6 to 8 cm from the nipple in a loosely grouped/somewhat segmental distribution spanning 2 cm. Left Breast Biopsy UIQ anterior and Posterior: IG-HG DCIS Er 100%, PR 100%   04/03/2022 Cancer Staging   Staging form: Breast, AJCC 8th Edition - Clinical: Stage 0 (cTis (DCIS), cN0, cM0, G3, ER+, PR+, HER2: Not Assessed) - Signed by [name], MD on 04/03/2022 Stage prefix: Initial diagnosis Histologic grading system: 3 grade system     CHIEF COMPLIANT: Follow- up after surgery   INTERVAL HISTORY: Isabel Christensen is a 77 y.o. female is here because of recent diagnosis of left breast DCIS. She presents to the clinic for a follow-up after surgery to discuss the final pathology report and come up with an adjuvant treatment plan.      ALLERGIES:  is allergic to aspirin, black cohosh, and promethazine hcl.  MEDICATIONS:  Current Outpatient Medications  Medication Sig Dispense Refill   amLODipine (NORVASC) 5 MG tablet Take 1 tablet by mouth once daily 90 tablet 0   celecoxib (CELEBREX) 100 MG capsule Take 1 capsule (100 mg total) by mouth 2 (two) times daily. 30 capsule 11   cholecalciferol (VITAMIN D3) 25 MCG (1000 UNIT) tablet Take 1,000 Units by mouth daily.     lansoprazole (PREVACID) 15 MG capsule TAKE 1 CAPSULE EVERY DAY 90 capsule 3   Misc Natural Products (LUTEIN 20) CAPS Take 1 tablet by mouth daily.     Multiple Vitamin (MULTIVITAMIN) tablet Take 1 tablet by  mouth daily.     oxyCODONE (ROXICODONE) 5 MG immediate release tablet Take 1 tablet (5 mg total) by mouth every 6 (six) hours as needed for severe pain. 10 tablet 0   psyllium (METAMUCIL) 58.6 % packet Take 1 packet by mouth daily.     No current facility-administered medications for this visit.    PHYSICAL EXAMINATION: ECOG PERFORMANCE STATUS: {CHL ONC ECOG [phone]}  There were no vitals filed for this visit. There were no vitals filed for this visit.  BREAST:*** No palpable masses or nodules in either right or left breasts. No palpable axillary supraclavicular or infraclavicular adenopathy no breast tenderness or nipple discharge. (exam performed in the presence of a chaperone)  LABORATORY DATA:  I have reviewed the data as listed    Latest Ref Rng & Units 07/21/2021   10:58 AM 07/19/2020    2:31 PM 07/18/2019    3:01 PM  CMP  Glucose 70 - 99 mg/dL 87  89  89   BUN 6 - 23 mg/dL '12  10  8   '$ Creatinine 0.40 - 1.20 mg/dL 0.60  0.59  0.61   Sodium 135 - 145 mEq/L 135  137  129   Potassium 3.5 - 5.1 mEq/L 4.2  4.0  4.0   Chloride 96 - 112 mEq/L 100  101  98   CO2 19 - 32 mEq/L '28  27  27   '$ Calcium 8.4 - 10.5 mg/dL 9.5  9.5  9.4   Total Protein 6.0 - 8.3 g/dL 6.9  7.1  7.1   Total Bilirubin 0.2 - 1.2 mg/dL 0.5  0.4  0.4   Alkaline Phos 39 - 117 U/L 68  68  71   AST 0 - 37 U/L '20  18  20   '$ ALT 0 - 35 U/L '12  11  13     '$ Lab Results  Component Value Date   WBC 7.3 07/21/2021   HGB 13.6 07/21/2021   HCT 39.2 07/21/2021   MCV 96.4 07/21/2021   PLT 305.0 07/21/2021   NEUTROABS 4.2 07/21/2021    ASSESSMENT & PLAN:  No problem-specific Assessment & Plan notes found for this encounter.    No orders of the defined types were placed in this encounter.  The patient has a good understanding of the overall plan. she agrees with it. she will call with any problems that may develop before the next visit here. Total time spent: 30 mins including face to face time and time spent  for planning, charting and co-ordination of care   Suzzette Righter, [place] 04/20/22    I Gardiner Coins am acting as a Education administrator for [employer]  ***

## 2022-04-24 ENCOUNTER — Inpatient Hospital Stay: Payer: Medicare Other | Admitting: Hematology and Oncology

## 2022-04-24 VITALS — BP 143/72 | HR 68 | Temp 97.6°F | Resp 16 | Wt 111.8 lb

## 2022-04-24 DIAGNOSIS — D0512 Intraductal carcinoma in situ of left breast: Secondary | ICD-10-CM | POA: Diagnosis not present

## 2022-04-24 NOTE — Assessment & Plan Note (Signed)
04/12/2022:Left lumpectomy: High-grade DCIS cribriform type with necrosis, 1.3 cm, negative for invasive cancer, margins negative ER 100%, PR 100%  Pathology counseling: I discussed the final pathology report of the patient provided  a copy of this report. I discussed the margins.  We also discussed the final staging along with previously performed ER/PR testing.  Treatment plan: Adjuvant radiation therapy Followed by antiestrogen therapy with tamoxifen 5 years  Patient did not want to do genetic testing. Return to clinic after radiation is complete

## 2022-04-26 NOTE — Progress Notes (Signed)
Location of Breast Cancer: Ductal carcinoma in situ (DCIS) of left breast   Histology per Pathology Report:  03/16/2022 1. Breast, left, needle core biopsy, upper inner anterior, ribbon clip - DUCTAL CARCINOMA IN SITU, INTERMEDIATE TO HIGH GRADE - NECROSIS: PRESENT - CALCIFICATIONS: PRESENT - DCIS LENGTH: 0.4 CM 2. Breast, left, needle core biopsy, upper inner posterior, X clip - DUCTAL CARCINOMA IN SITU, INTERMEDIATE TO HIGH GRADE - NECROSIS: PRESENT - CALCIFICATIONS: PRESENT - DCIS LENGTH: 0.5 CM  Receptor Status: ER(100%), PR (100%),  Did patient present with symptoms (if so, please note symptoms) or was this found on screening mammography?:   per Dr. Ethlyn Gallery 03/20/22 note: "recently went for a routine screening mammogram. At that time she was found to have 2 cm of calcification in the upper inner quadrant of the left breast."   Past/Anticipated interventions by surgeon, if any 04/12/2022   PRE-OPERATIVE DIAGNOSIS:  LEFT BREAST DCIS   POST-OPERATIVE DIAGNOSIS:  LEFT BREAST DCIS   PROCEDURE:  Procedure(s): LEFT BREAST BRACKETED LUMPECTOMY WITH RADIOACTIVE SEED LOCALIZATION (Left)   SURGEON:  Surgeon(s) and Role:    Jovita Kussmaul, MD - Primary:   Past/Anticipated interventions by medical oncology, if any:  Dr. Lindi Adie on 04-24-22  SUMMARY OF ONCOLOGIC HISTORY:    Oncology History  Ductal carcinoma in situ (DCIS) of left breast  03/16/2022 Initial Diagnosis    Screening mammogram detected pleomorphic microcalcifications in the upper inner left breast 6 to 8 cm from the nipple in a loosely grouped/somewhat segmental distribution spanning 2 cm. Left Breast Biopsy UIQ anterior and Posterior: IG-HG DCIS Er 100%, PR 100%    04/03/2022 Cancer Staging    Staging form: Breast, AJCC 8th Edition - Clinical: Stage 0 (cTis (DCIS), cN0, cM0, G3, ER+, PR+, HER2: Not Assessed) - Signed by [name], MD on 04/03/2022 Stage prefix: Initial diagnosis Histologic grading system: 3 grade  system    04/12/2022 Surgery    Left lumpectomy: High-grade DCIS cribriform type with necrosis, 1.3 cm, negative for invasive cancer, margins negative ER 100%, PR 100%    ASSESSMENT & PLAN:  Ductal carcinoma in situ (DCIS) of left breast 04/12/2022:Left lumpectomy: High-grade DCIS cribriform type with necrosis, 1.3 cm, negative for invasive cancer, margins negative ER 100%, PR 100%   Pathology counseling: I discussed the final pathology report of the patient provided  a copy of this report. I discussed the margins.  We also discussed the final staging along with previously performed ER/PR testing.   Treatment plan: Adjuvant radiation therapy Followed by antiestrogen therapy with tamoxifen 5 years   Patient did not want to do genetic testing. Return to clinic after radiation is complete   Lymphedema issues, if any:  no    Pain issues, if any:  no     SAFETY ISSUES: Prior radiation? no Pacemaker/ICD? no Possible current pregnancy?no Is the patient on methotrexate? no  Current Complaints / other details:   BP (!) 141/75 (BP Location: Right Arm, Patient Position: Sitting)   Pulse 78   Temp (!) 97.1 F (36.2 C) (Temporal)   Resp 18   Ht 5' 3.25" (1.607 m)   Wt 111 lb 2 oz (50.4 kg)   SpO2 100%   BMI 19.53 kg/m

## 2022-05-08 ENCOUNTER — Telehealth: Payer: Self-pay | Admitting: *Deleted

## 2022-05-08 NOTE — Telephone Encounter (Signed)
Lab appt canceled for 05/09/22.  Lab appt was a for genetic testing but genetic appt had been canceled on 04/03/22.  RN attempt x1 to contact pt.  No answer, LVM stating lab will be canceled since it was needed for genetic testing that has also been canceled at this time.

## 2022-05-09 ENCOUNTER — Encounter: Payer: Medicare Other | Admitting: Genetic Counselor

## 2022-05-09 ENCOUNTER — Inpatient Hospital Stay: Payer: Medicare Other

## 2022-05-09 NOTE — Progress Notes (Signed)
Radiation Oncology         [phone] ________________________________  Name: Isabel Christensen MRN: [id number]  Date: 05/10/2022  DOB: 1945/09/14  Follow-Up Visit Note  Outpatient  CC: Isabel Borg, MD  Isabel Lose, MD  Diagnosis:   No diagnosis found.   Stage 0 (cTis (DCIS), cN0, cM0) Left Breast, High-grade DCIS, ER+ / PR+ / Her2 not assessed  CHIEF COMPLAINT: Here to discuss management of left breast DCIS  Narrative:  The patient returns today for follow-up.     Since her consultation date of 03/22/22, she opted to proceed with a left breast lumpectomy without nodal biopsies on 04/12/22 under the care of Dr. Marlou Christensen. Pathology from the procedure revealed: tumor size of 1.3 x 1.1 x 0.8 cm; histology of high-grade DCIS with necrosis; all margins negative for DCIS; margin status to in situ disease of 7 mm from the media margin; no lymph nodes were examined;  ER status: 100% positive and PR status 100% positive, both with strong staining intensity, Her2 not assessed.  Post-operatively, the patient developed a rash with associated pain along the upper portion of the breast. This has since resolved. She otherwise continues to heal well from surgery.   The patient has met with Dr. Lindi Christensen and will proceed with antiestrogen therapy consisting of tamoxifen (x 5 years) following XRT.   Symptomatically, the patient reports: ***        ALLERGIES:  is allergic to aspirin, black cohosh, and promethazine hcl.  Meds: Current Outpatient Medications  Medication Sig Dispense Refill   amLODipine (NORVASC) 5 MG tablet Take 1 tablet by mouth once daily 90 tablet 0   celecoxib (CELEBREX) 100 MG capsule Take 1 capsule (100 mg total) by mouth 2 (two) times daily. 30 capsule 11   cephALEXin (KEFLEX) 500 MG capsule Take 500 mg by mouth 3 (three) times daily.     cholecalciferol (VITAMIN D3) 25 MCG (1000 UNIT) tablet Take 1,000 Units by mouth daily.     lansoprazole (PREVACID) 15 MG capsule TAKE 1 CAPSULE  EVERY DAY 90 capsule 3   Misc Natural Products (LUTEIN 20) CAPS Take 1 tablet by mouth daily.     Multiple Vitamin (MULTIVITAMIN) tablet Take 1 tablet by mouth daily.     psyllium (METAMUCIL) 58.6 % packet Take 1 packet by mouth daily.     No current facility-administered medications for this encounter.    Physical Findings:  vitals were not taken for this visit. .     General: Alert and oriented, in no acute distress HEENT: Head is normocephalic. Extraocular movements are intact. Oropharynx is clear. Neck: Neck is supple, no palpable cervical or supraclavicular lymphadenopathy. Heart: Regular in rate and rhythm with no murmurs, rubs, or gallops. Chest: Clear to auscultation bilaterally, with no rhonchi, wheezes, or rales. Abdomen: Soft, nontender, nondistended, with no rigidity or guarding. Extremities: No cyanosis or edema. Lymphatics: see Neck Exam Musculoskeletal: symmetric strength and muscle tone throughout. Neurologic: No obvious focalities. Speech is fluent.  Psychiatric: Judgment and insight are intact. Affect is appropriate. Breast exam reveals ***  Lab Findings: Lab Results  Component Value Date   WBC 7.3 07/21/2021   HGB 13.6 07/21/2021   HCT 39.2 07/21/2021   MCV 96.4 07/21/2021   PLT 305.0 07/21/2021    '@LASTCHEMISTRY'$ @  Radiographic Findings: MM Breast Surgical Specimen  Result Date: 04/12/2022 CLINICAL DATA:  Post lumpectomy specimen radiograph EXAM: SPECIMEN RADIOGRAPH OF THE LEFT BREAST COMPARISON:  Previous exam(s). FINDINGS: Status post excision of the left  breast. The two radioactive seed and two biopsy marker clip are present, completely intact, and were marked for pathology. IMPRESSION: Specimen radiograph of the left breast. Electronically Signed   By: Isabel Christensen M.D.   On: 04/12/2022 13:27  MM LT RADIOACTIVE SEED LOC MAMMO GUIDE  Result Date: 04/11/2022 CLINICAL DATA:  77 year old female with newly diagnosed left breast cancer presenting for  seed localization. EXAM: MAMMOGRAPHIC GUIDED RADIOACTIVE SEED LOCALIZATION OF THE LEFT BREAST MAMMOGRAPHIC GUIDED RADIOACTIVE SEED LOCALIZATION OF THE LEFT BREAST COMPARISON:  Previous exam(s). FINDINGS: Patient presents for radioactive seed localization prior to left breast lumpectomy. I met with the patient and we discussed the procedure of seed localization including benefits and alternatives. We discussed the high likelihood of a successful procedure. We discussed the risks of the procedure including infection, bleeding, tissue injury and further surgery. We discussed the low dose of radioactivity involved in the procedure. Informed, written consent was given. The usual time-out protocol was performed immediately prior to the procedure. Using mammographic guidance, sterile technique, 1% lidocaine and an I-125 radioactive seed, the X shaped biopsy marking clip in the left breast was localized using a medial approach. The follow-up mammogram images confirm the seed in the expected location and were marked for Dr. Marlou Christensen. Follow-up survey of the patient confirms presence of the radioactive seed. Order number of I-125 seed:  [id number]. Total activity: 0.236 mCi reference Date: March 17, 2022 Using mammographic guidance, sterile technique, 1% lidocaine and an I-125 radioactive seed, the ribbon shaped biopsy marking clip in the left breast was localized using a medial approach. The follow-up mammogram images confirm the seed in the expected location and were marked for Dr. Marlou Christensen. Follow-up survey of the patient confirms presence of the radioactive seed. Order number of I-125 seed:  [id number]. Total activity: 0.236 mCi reference Date: March 17, 2022 The patient tolerated the procedure well and was released from the [facility]. She was given instructions regarding seed removal. IMPRESSION: Radioactive seed localization left breast. No apparent complications. Electronically Signed   By: Isabel Christensen M.D.   On:  04/11/2022 09:50  MM LT RAD SEED EA ADD LESION LOC MAMMO  Result Date: 04/11/2022 CLINICAL DATA:  77 year old female with newly diagnosed left breast cancer presenting for seed localization. EXAM: MAMMOGRAPHIC GUIDED RADIOACTIVE SEED LOCALIZATION OF THE LEFT BREAST MAMMOGRAPHIC GUIDED RADIOACTIVE SEED LOCALIZATION OF THE LEFT BREAST COMPARISON:  Previous exam(s). FINDINGS: Patient presents for radioactive seed localization prior to left breast lumpectomy. I met with the patient and we discussed the procedure of seed localization including benefits and alternatives. We discussed the high likelihood of a successful procedure. We discussed the risks of the procedure including infection, bleeding, tissue injury and further surgery. We discussed the low dose of radioactivity involved in the procedure. Informed, written consent was given. The usual time-out protocol was performed immediately prior to the procedure. Using mammographic guidance, sterile technique, 1% lidocaine and an I-125 radioactive seed, the X shaped biopsy marking clip in the left breast was localized using a medial approach. The follow-up mammogram images confirm the seed in the expected location and were marked for Dr. Marlou Christensen. Follow-up survey of the patient confirms presence of the radioactive seed. Order number of I-125 seed:  [id number]. Total activity: 0.236 mCi reference Date: March 17, 2022 Using mammographic guidance, sterile technique, 1% lidocaine and an I-125 radioactive seed, the ribbon shaped biopsy marking clip in the left breast was localized using a medial approach. The follow-up mammogram images confirm the seed in the  expected location and were marked for Dr. Marlou Christensen. Follow-up survey of the patient confirms presence of the radioactive seed. Order number of I-125 seed:  [id number]. Total activity: 0.236 mCi reference Date: March 17, 2022 The patient tolerated the procedure well and was released from the [facility]. She was given  instructions regarding seed removal. IMPRESSION: Radioactive seed localization left breast. No apparent complications. Electronically Signed   By: Isabel Christensen M.D.   On: 04/11/2022 09:50   Impression/Plan: We discussed adjuvant radiotherapy today.  I recommend *** in order to ***.  I reviewed the logistics, benefits, risks, and potential side effects of this treatment in detail. Risks may include but not necessary be limited to acute and late injury tissue in the radiation fields such as skin irritation (change in color/pigmentation, itching, dryness, pain, peeling). She may experience fatigue. We also discussed possible risk of long term cosmetic changes or scar tissue. There is also a smaller risk for lung toxicity, ***cardiac toxicity, ***brachial plexopathy, ***lymphedema, ***musculoskeletal changes, ***rib fragility or ***induction of a second malignancy, ***late chronic non-healing soft tissue wound.    The patient asked good questions which I answered to her satisfaction. She is enthusiastic about proceeding with treatment. A consent form has been *** signed and placed in her chart.  A total of *** medically necessary complex treatment devices will be fabricated and supervised by me: *** fields with MLCs for custom blocks to protect heart, and lungs;  and, a Vac-lok. MORE COMPLEX DEVICES MAY BE MADE IN DOSIMETRY FOR FIELD IN FIELD BEAMS FOR DOSE HOMOGENEITY.  I have requested : 3D Simulation which is medically necessary to give adequate dose to at risk tissues while sparing lungs and heart.  I have requested a DVH of the following structures: lungs, heart, *** lumpectomy cavity.    The patient will receive *** Gy in *** fractions to the *** with *** fields.  This will be *** followed by a boost.  On date of service, in total, I spent *** minutes on this encounter. Patient was seen in person.  _____________________________________   Eppie Gibson, MD  This document serves as a record of  services personally performed by Eppie Gibson, MD. It was created on her behalf by Roney Mans, a trained medical scribe. The creation of this record is based on the scribe's personal observations and the provider's statements to them. This document has been checked and approved by the attending provider.

## 2022-05-10 ENCOUNTER — Ambulatory Visit
Admission: RE | Admit: 2022-05-10 | Discharge: 2022-05-10 | Disposition: A | Discharge status: Home / Self Care | Payer: Medicare Other | Source: Ambulatory Visit | Attending: Radiation Oncology | Admitting: Radiation Oncology

## 2022-05-10 ENCOUNTER — Encounter: Payer: Self-pay | Admitting: Radiation Oncology

## 2022-05-10 ENCOUNTER — Other Ambulatory Visit: Payer: Self-pay

## 2022-05-10 DIAGNOSIS — Z79899 Other long term (current) drug therapy: Secondary | ICD-10-CM | POA: Diagnosis not present

## 2022-05-10 DIAGNOSIS — D0512 Intraductal carcinoma in situ of left breast: Secondary | ICD-10-CM | POA: Insufficient documentation

## 2022-05-10 DIAGNOSIS — Z170 Estrogen receptor positive status [ER+]: Secondary | ICD-10-CM | POA: Diagnosis not present

## 2022-05-10 DIAGNOSIS — Z510 Encounter for antineoplastic radiation therapy: Secondary | ICD-10-CM | POA: Diagnosis present

## 2022-05-10 DIAGNOSIS — Z791 Long term (current) use of non-steroidal anti-inflammatories (NSAID): Secondary | ICD-10-CM | POA: Insufficient documentation

## 2022-05-17 ENCOUNTER — Encounter: Payer: Self-pay | Admitting: *Deleted

## 2022-05-17 DIAGNOSIS — Z510 Encounter for antineoplastic radiation therapy: Secondary | ICD-10-CM | POA: Diagnosis not present

## 2022-05-20 ENCOUNTER — Other Ambulatory Visit: Payer: Self-pay | Admitting: Internal Medicine

## 2022-05-24 ENCOUNTER — Encounter: Payer: Self-pay | Admitting: *Deleted

## 2022-05-24 ENCOUNTER — Ambulatory Visit
Admission: RE | Admit: 2022-05-24 | Discharge: 2022-05-24 | Disposition: A | Discharge status: Home / Self Care | Payer: Medicare Other | Source: Ambulatory Visit | Attending: Radiation Oncology | Admitting: Radiation Oncology

## 2022-05-24 ENCOUNTER — Other Ambulatory Visit: Payer: Self-pay

## 2022-05-24 DIAGNOSIS — Z510 Encounter for antineoplastic radiation therapy: Secondary | ICD-10-CM | POA: Diagnosis not present

## 2022-05-24 LAB — RAD ONC ARIA SESSION SUMMARY
Course Elapsed Days: 0
Plan Fractions Treated to Date: 1
Plan Prescribed Dose Per Fraction: 2.67 Gy
Plan Total Fractions Prescribed: 15
Plan Total Prescribed Dose: 40.05 Gy
Reference Point Dosage Given to Date: 2.67 Gy
Reference Point Session Dosage Given: 2.67 Gy
Session Number: 1

## 2022-05-25 ENCOUNTER — Other Ambulatory Visit: Payer: Self-pay

## 2022-05-25 ENCOUNTER — Ambulatory Visit
Admission: RE | Admit: 2022-05-25 | Discharge: 2022-05-25 | Disposition: A | Discharge status: Home / Self Care | Payer: Medicare Other | Source: Ambulatory Visit | Attending: Radiation Oncology | Admitting: Radiation Oncology

## 2022-05-25 DIAGNOSIS — Z510 Encounter for antineoplastic radiation therapy: Secondary | ICD-10-CM | POA: Diagnosis not present

## 2022-05-25 LAB — RAD ONC ARIA SESSION SUMMARY
Course Elapsed Days: 1
Plan Fractions Treated to Date: 2
Plan Prescribed Dose Per Fraction: 2.67 Gy
Plan Total Fractions Prescribed: 15
Plan Total Prescribed Dose: 40.05 Gy
Reference Point Dosage Given to Date: 5.34 Gy
Reference Point Session Dosage Given: 2.67 Gy
Session Number: 2

## 2022-05-26 ENCOUNTER — Other Ambulatory Visit: Payer: Self-pay

## 2022-05-26 ENCOUNTER — Ambulatory Visit
Admission: RE | Admit: 2022-05-26 | Discharge: 2022-05-26 | Disposition: A | Discharge status: Home / Self Care | Payer: Medicare Other | Source: Ambulatory Visit | Attending: Radiation Oncology | Admitting: Radiation Oncology

## 2022-05-26 DIAGNOSIS — Z510 Encounter for antineoplastic radiation therapy: Secondary | ICD-10-CM | POA: Diagnosis not present

## 2022-05-26 LAB — RAD ONC ARIA SESSION SUMMARY
Course Elapsed Days: 2
Plan Fractions Treated to Date: 3
Plan Prescribed Dose Per Fraction: 2.67 Gy
Plan Total Fractions Prescribed: 15
Plan Total Prescribed Dose: 40.05 Gy
Reference Point Dosage Given to Date: 8.01 Gy
Reference Point Session Dosage Given: 2.67 Gy
Session Number: 3

## 2022-05-29 ENCOUNTER — Other Ambulatory Visit: Payer: Self-pay

## 2022-05-29 ENCOUNTER — Ambulatory Visit
Admission: RE | Admit: 2022-05-29 | Discharge: 2022-05-29 | Disposition: A | Discharge status: Home / Self Care | Payer: Medicare Other | Source: Ambulatory Visit | Attending: Radiation Oncology | Admitting: Radiation Oncology

## 2022-05-29 DIAGNOSIS — Z510 Encounter for antineoplastic radiation therapy: Secondary | ICD-10-CM | POA: Diagnosis present

## 2022-05-29 DIAGNOSIS — D0512 Intraductal carcinoma in situ of left breast: Secondary | ICD-10-CM | POA: Insufficient documentation

## 2022-05-29 LAB — RAD ONC ARIA SESSION SUMMARY
Course Elapsed Days: 5
Plan Fractions Treated to Date: 4
Plan Prescribed Dose Per Fraction: 2.67 Gy
Plan Total Fractions Prescribed: 15
Plan Total Prescribed Dose: 40.05 Gy
Reference Point Dosage Given to Date: 10.68 Gy
Reference Point Session Dosage Given: 2.67 Gy
Session Number: 4

## 2022-05-30 ENCOUNTER — Other Ambulatory Visit: Payer: Self-pay

## 2022-05-30 ENCOUNTER — Ambulatory Visit
Admission: RE | Admit: 2022-05-30 | Discharge: 2022-05-30 | Disposition: A | Discharge status: Home / Self Care | Payer: Medicare Other | Source: Ambulatory Visit | Attending: Radiation Oncology | Admitting: Radiation Oncology

## 2022-05-30 DIAGNOSIS — Z510 Encounter for antineoplastic radiation therapy: Secondary | ICD-10-CM | POA: Diagnosis not present

## 2022-05-30 LAB — RAD ONC ARIA SESSION SUMMARY
Course Elapsed Days: 6
Plan Fractions Treated to Date: 5
Plan Prescribed Dose Per Fraction: 2.67 Gy
Plan Total Fractions Prescribed: 15
Plan Total Prescribed Dose: 40.05 Gy
Reference Point Dosage Given to Date: 13.35 Gy
Reference Point Session Dosage Given: 2.67 Gy
Session Number: 5

## 2022-05-31 ENCOUNTER — Ambulatory Visit
Admission: RE | Admit: 2022-05-31 | Discharge: 2022-05-31 | Disposition: A | Discharge status: Home / Self Care | Payer: Medicare Other | Source: Ambulatory Visit | Attending: Radiation Oncology | Admitting: Radiation Oncology

## 2022-05-31 ENCOUNTER — Other Ambulatory Visit: Payer: Self-pay

## 2022-05-31 DIAGNOSIS — Z510 Encounter for antineoplastic radiation therapy: Secondary | ICD-10-CM | POA: Diagnosis not present

## 2022-05-31 LAB — RAD ONC ARIA SESSION SUMMARY
Course Elapsed Days: 7
Plan Fractions Treated to Date: 6
Plan Prescribed Dose Per Fraction: 2.67 Gy
Plan Total Fractions Prescribed: 15
Plan Total Prescribed Dose: 40.05 Gy
Reference Point Dosage Given to Date: 16.02 Gy
Reference Point Session Dosage Given: 2.67 Gy
Session Number: 6

## 2022-06-01 ENCOUNTER — Ambulatory Visit
Admission: RE | Admit: 2022-06-01 | Discharge: 2022-06-01 | Disposition: A | Discharge status: Home / Self Care | Payer: Medicare Other | Source: Ambulatory Visit | Attending: Radiation Oncology | Admitting: Radiation Oncology

## 2022-06-01 ENCOUNTER — Other Ambulatory Visit: Payer: Self-pay

## 2022-06-01 DIAGNOSIS — Z510 Encounter for antineoplastic radiation therapy: Secondary | ICD-10-CM | POA: Diagnosis not present

## 2022-06-01 LAB — RAD ONC ARIA SESSION SUMMARY
Course Elapsed Days: 8
Plan Fractions Treated to Date: 7
Plan Prescribed Dose Per Fraction: 2.67 Gy
Plan Total Fractions Prescribed: 15
Plan Total Prescribed Dose: 40.05 Gy
Reference Point Dosage Given to Date: 18.69 Gy
Reference Point Session Dosage Given: 2.67 Gy
Session Number: 7

## 2022-06-02 ENCOUNTER — Other Ambulatory Visit: Payer: Self-pay

## 2022-06-02 ENCOUNTER — Ambulatory Visit
Admission: RE | Admit: 2022-06-02 | Discharge: 2022-06-02 | Disposition: A | Discharge status: Home / Self Care | Payer: Medicare Other | Source: Ambulatory Visit | Attending: Radiation Oncology | Admitting: Radiation Oncology

## 2022-06-02 DIAGNOSIS — Z510 Encounter for antineoplastic radiation therapy: Secondary | ICD-10-CM | POA: Diagnosis not present

## 2022-06-02 LAB — RAD ONC ARIA SESSION SUMMARY
Course Elapsed Days: 9
Plan Fractions Treated to Date: 8
Plan Prescribed Dose Per Fraction: 2.67 Gy
Plan Total Fractions Prescribed: 15
Plan Total Prescribed Dose: 40.05 Gy
Reference Point Dosage Given to Date: 21.36 Gy
Reference Point Session Dosage Given: 2.67 Gy
Session Number: 8

## 2022-06-05 ENCOUNTER — Other Ambulatory Visit: Payer: Self-pay

## 2022-06-05 ENCOUNTER — Ambulatory Visit: Payer: Medicare Other

## 2022-06-05 ENCOUNTER — Ambulatory Visit
Admission: RE | Admit: 2022-06-05 | Discharge: 2022-06-05 | Disposition: A | Discharge status: Home / Self Care | Payer: Medicare Other | Source: Ambulatory Visit | Attending: Radiation Oncology | Admitting: Radiation Oncology

## 2022-06-05 DIAGNOSIS — Z510 Encounter for antineoplastic radiation therapy: Secondary | ICD-10-CM | POA: Diagnosis not present

## 2022-06-05 LAB — RAD ONC ARIA SESSION SUMMARY
Course Elapsed Days: 12
Plan Fractions Treated to Date: 9
Plan Prescribed Dose Per Fraction: 2.67 Gy
Plan Total Fractions Prescribed: 15
Plan Total Prescribed Dose: 40.05 Gy
Reference Point Dosage Given to Date: 24.03 Gy
Reference Point Session Dosage Given: 2.67 Gy
Session Number: 9

## 2022-06-06 ENCOUNTER — Other Ambulatory Visit: Payer: Self-pay

## 2022-06-06 ENCOUNTER — Ambulatory Visit
Admission: RE | Admit: 2022-06-06 | Discharge: 2022-06-06 | Disposition: A | Discharge status: Home / Self Care | Payer: Medicare Other | Source: Ambulatory Visit | Attending: Radiation Oncology | Admitting: Radiation Oncology

## 2022-06-06 DIAGNOSIS — Z510 Encounter for antineoplastic radiation therapy: Secondary | ICD-10-CM | POA: Diagnosis not present

## 2022-06-06 LAB — RAD ONC ARIA SESSION SUMMARY
Course Elapsed Days: 13
Plan Fractions Treated to Date: 10
Plan Prescribed Dose Per Fraction: 2.67 Gy
Plan Total Fractions Prescribed: 15
Plan Total Prescribed Dose: 40.05 Gy
Reference Point Dosage Given to Date: 26.7 Gy
Reference Point Session Dosage Given: 2.67 Gy
Session Number: 10

## 2022-06-07 ENCOUNTER — Ambulatory Visit
Admission: RE | Admit: 2022-06-07 | Discharge: 2022-06-07 | Disposition: A | Discharge status: Home / Self Care | Payer: Medicare Other | Source: Ambulatory Visit | Attending: Radiation Oncology | Admitting: Radiation Oncology

## 2022-06-07 ENCOUNTER — Other Ambulatory Visit: Payer: Self-pay

## 2022-06-07 DIAGNOSIS — Z510 Encounter for antineoplastic radiation therapy: Secondary | ICD-10-CM | POA: Diagnosis not present

## 2022-06-07 LAB — RAD ONC ARIA SESSION SUMMARY
Course Elapsed Days: 14
Plan Fractions Treated to Date: 11
Plan Prescribed Dose Per Fraction: 2.67 Gy
Plan Total Fractions Prescribed: 15
Plan Total Prescribed Dose: 40.05 Gy
Reference Point Dosage Given to Date: 29.37 Gy
Reference Point Session Dosage Given: 2.67 Gy
Session Number: 11

## 2022-06-08 ENCOUNTER — Other Ambulatory Visit: Payer: Self-pay

## 2022-06-08 ENCOUNTER — Ambulatory Visit
Admission: RE | Admit: 2022-06-08 | Discharge: 2022-06-08 | Disposition: A | Discharge status: Home / Self Care | Payer: Medicare Other | Source: Ambulatory Visit | Attending: Radiation Oncology | Admitting: Radiation Oncology

## 2022-06-08 DIAGNOSIS — Z510 Encounter for antineoplastic radiation therapy: Secondary | ICD-10-CM | POA: Diagnosis not present

## 2022-06-08 LAB — RAD ONC ARIA SESSION SUMMARY
Course Elapsed Days: 15
Plan Fractions Treated to Date: 12
Plan Prescribed Dose Per Fraction: 2.67 Gy
Plan Total Fractions Prescribed: 15
Plan Total Prescribed Dose: 40.05 Gy
Reference Point Dosage Given to Date: 32.04 Gy
Reference Point Session Dosage Given: 2.67 Gy
Session Number: 12

## 2022-06-09 ENCOUNTER — Ambulatory Visit
Admission: RE | Admit: 2022-06-09 | Discharge: 2022-06-09 | Disposition: A | Discharge status: Home / Self Care | Payer: Medicare Other | Source: Ambulatory Visit | Attending: Radiation Oncology | Admitting: Radiation Oncology

## 2022-06-09 ENCOUNTER — Other Ambulatory Visit: Payer: Self-pay

## 2022-06-09 DIAGNOSIS — Z510 Encounter for antineoplastic radiation therapy: Secondary | ICD-10-CM | POA: Diagnosis not present

## 2022-06-09 LAB — RAD ONC ARIA SESSION SUMMARY
Course Elapsed Days: 16
Plan Fractions Treated to Date: 13
Plan Prescribed Dose Per Fraction: 2.67 Gy
Plan Total Fractions Prescribed: 15
Plan Total Prescribed Dose: 40.05 Gy
Reference Point Dosage Given to Date: 34.71 Gy
Reference Point Session Dosage Given: 2.67 Gy
Session Number: 13

## 2022-06-12 ENCOUNTER — Ambulatory Visit: Payer: Medicare Other

## 2022-06-12 ENCOUNTER — Other Ambulatory Visit: Payer: Self-pay

## 2022-06-12 ENCOUNTER — Ambulatory Visit: Admission: RE | Admit: 2022-06-12 | Payer: Medicare Other | Source: Ambulatory Visit

## 2022-06-12 ENCOUNTER — Ambulatory Visit
Admission: RE | Admit: 2022-06-12 | Discharge: 2022-06-12 | Disposition: A | Discharge status: Home / Self Care | Payer: Medicare Other | Source: Ambulatory Visit | Attending: Radiation Oncology | Admitting: Radiation Oncology

## 2022-06-12 DIAGNOSIS — Z510 Encounter for antineoplastic radiation therapy: Secondary | ICD-10-CM | POA: Diagnosis not present

## 2022-06-12 LAB — RAD ONC ARIA SESSION SUMMARY
Course Elapsed Days: 19
Plan Fractions Treated to Date: 14
Plan Prescribed Dose Per Fraction: 2.67 Gy
Plan Total Fractions Prescribed: 15
Plan Total Prescribed Dose: 40.05 Gy
Reference Point Dosage Given to Date: 37.38 Gy
Reference Point Session Dosage Given: 2.67 Gy
Session Number: 14

## 2022-06-13 ENCOUNTER — Ambulatory Visit
Admission: RE | Admit: 2022-06-13 | Discharge: 2022-06-13 | Disposition: A | Discharge status: Home / Self Care | Payer: Medicare Other | Source: Ambulatory Visit | Attending: Radiation Oncology | Admitting: Radiation Oncology

## 2022-06-13 ENCOUNTER — Encounter: Payer: Self-pay | Admitting: *Deleted

## 2022-06-13 ENCOUNTER — Ambulatory Visit: Admission: RE | Admit: 2022-06-13 | Payer: Medicare Other | Source: Ambulatory Visit

## 2022-06-13 ENCOUNTER — Other Ambulatory Visit: Payer: Self-pay

## 2022-06-13 DIAGNOSIS — Z510 Encounter for antineoplastic radiation therapy: Secondary | ICD-10-CM | POA: Diagnosis not present

## 2022-06-13 DIAGNOSIS — D0512 Intraductal carcinoma in situ of left breast: Secondary | ICD-10-CM

## 2022-06-13 LAB — RAD ONC ARIA SESSION SUMMARY
Course Elapsed Days: 20
Plan Fractions Treated to Date: 15
Plan Prescribed Dose Per Fraction: 2.67 Gy
Plan Total Fractions Prescribed: 15
Plan Total Prescribed Dose: 40.05 Gy
Reference Point Dosage Given to Date: 40.05 Gy
Reference Point Session Dosage Given: 2.67 Gy
Session Number: 15

## 2022-06-13 NOTE — Progress Notes (Signed)
Patient Care Team: Corwin Levins, MD as PCP - General Sherrine Maples, Francoise Schaumann, MD as Consulting Physician (Ophthalmology) Pershing Proud, RN as Oncology Nurse Navigator Donnelly Angelica, RN as Oncology Nurse Navigator Serena Croissant, MD as Consulting Physician (Hematology and Oncology)  DIAGNOSIS:  Encounter Diagnosis  Name Primary?   Ductal carcinoma in situ (DCIS) of left breast Yes    SUMMARY OF ONCOLOGIC HISTORY: Oncology History  Ductal carcinoma in situ (DCIS) of left breast  03/16/2022 Initial Diagnosis   Screening mammogram detected pleomorphic microcalcifications in the upper inner left breast 6 to 8 cm from the nipple in a loosely grouped/somewhat segmental distribution spanning 2 cm. Left Breast Biopsy UIQ anterior and Posterior: IG-HG DCIS Er 100%, PR 100%   04/03/2022 Cancer Staging   Staging form: Breast, AJCC 8th Edition - Clinical: Stage 0 (cTis (DCIS), cN0, cM0, G3, ER+, PR+, HER2: Not Assessed) - Signed by Serena Croissant, MD on 04/03/2022 Stage prefix: Initial diagnosis Histologic grading system: 3 grade system   04/12/2022 Surgery   Left lumpectomy: High-grade DCIS cribriform type with necrosis, 1.3 cm, negative for invasive cancer, margins negative ER 100%, PR 100%   05/25/2022 - 06/13/2022 Radiation Therapy   Adjuvant radiation   06/16/2022 -  Anti-estrogen oral therapy   Tamoxifen     CHIEF COMPLIANT: Follow-up after radiation  INTERVAL HISTORY: Isabel Christensen is a  77 y.o. female is here because of recent diagnosis of left breast DCIS. She presents to the clinic for a follow-up to discuss antiestrogen therapy.. She reports that radiation went well. She tolerated it well.   ALLERGIES:  is allergic to aspirin, black cohosh, and promethazine hcl.  MEDICATIONS:  Current Outpatient Medications  Medication Sig Dispense Refill   amLODipine (NORVASC) 5 MG tablet Take 1 tablet by mouth once daily 90 tablet 0   celecoxib (CELEBREX) 100 MG capsule Take 1 capsule (100 mg  total) by mouth 2 (two) times daily. 30 capsule 11   cholecalciferol (VITAMIN D3) 25 MCG (1000 UNIT) tablet Take 1,000 Units by mouth daily.     lansoprazole (PREVACID) 15 MG capsule TAKE 1 CAPSULE EVERY DAY 90 capsule 3   Misc Natural Products (LUTEIN 20) CAPS Take 1 tablet by mouth daily.     Multiple Vitamin (MULTIVITAMIN) tablet Take 1 tablet by mouth daily.     psyllium (METAMUCIL) 58.6 % packet Take 1 packet by mouth daily.     tamoxifen (NOLVADEX) 10 MG tablet Take 0.5 tablets (5 mg total) by mouth daily. 30 tablet 0   No current facility-administered medications for this visit.    PHYSICAL EXAMINATION: ECOG PERFORMANCE STATUS: 1 - Symptomatic but completely ambulatory  Vitals:   06/16/22 0835  BP: (!) 146/58  Pulse: 77  Resp: 18  Temp: 97.7 F (36.5 C)  SpO2: 100%   Filed Weights   06/16/22 0835  Weight: 110 lb 12.8 oz (50.3 kg)      LABORATORY DATA:  I have reviewed the data as listed    Latest Ref Rng & Units 07/21/2021   10:58 AM 07/19/2020    2:31 PM 07/18/2019    3:01 PM  CMP  Glucose 70 - 99 mg/dL 87  89  89   BUN 6 - 23 mg/dL Creatinine 0.40 - 1.20 mg/dL 1.61  0.96  0.45   Sodium 135 - 145 mEq/L 135  137  129   Potassium 3.5 - 5.1 mEq/L 4.2  4.0  4.0  Chloride 96 - 112 mEq/L 100  101  98   CO2 19 - 32 mEq/L Calcium 8.4 - 10.5 mg/dL 9.5  9.5  9.4   Total Protein 6.0 - 8.3 g/dL 6.9  7.1  7.1   Total Bilirubin 0.2 - 1.2 mg/dL 0.5  0.4  0.4   Alkaline Phos 39 - 117 U/L 68  68  71   AST 0 - 37 U/L ALT 0 - 35 U/L Lab Results  Component Value Date   WBC 7.3 07/21/2021   HGB 13.6 07/21/2021   HCT 39.2 07/21/2021   MCV 96.4 07/21/2021   PLT 305.0 07/21/2021   NEUTROABS 4.2 07/21/2021    ASSESSMENT & PLAN:  Ductal carcinoma in situ (DCIS) of left breast 04/12/2022:Left lumpectomy: High-grade DCIS cribriform type with necrosis, 1.3 cm, negative for invasive cancer, margins negative ER 100%, PR 100%    Treatment plan: Adjuvant radiation therapy completed 06/13/2022 Followed by antiestrogen therapy with tamoxifen 5 years --------------------------------------------------------------------------------------------------------------------------------------------- Tamoxifen counseling: We discussed the risks and benefits of tamoxifen. These include but not limited to insomnia, hot flashes, mood changes, vaginal dryness, and weight gain. Although rare, serious side effects including endometrial cancer, risk of blood clots were also discussed. We strongly believe that the benefits far outweigh the risks. Patient understands these risks and consented to starting treatment. Planned treatment duration is 5 years. Patient would like to try 5 mg dose for a month and see if she can tolerate it.  Menopausal symptoms: Since she stopped estrogen patch she is having lots of hot flashes and mood swings (even before starting tamoxifen).  Return to clinic in 2 months for survivorship care plan visit    No orders of the defined types were placed in this encounter.  The patient has a good understanding of the overall plan. she agrees with it. she will call with any problems that may develop before the next visit here. Total time spent: 30 mins including face to face time and time spent for planning, charting and co-ordination of care   Isabel Meek, MD 06/16/22    I Janan Ridge am acting as a Neurosurgeon for [employer]  I have reviewed the above documentation for accuracy and completeness, and I agree with the above.

## 2022-06-14 ENCOUNTER — Ambulatory Visit: Payer: Medicare Other

## 2022-06-16 ENCOUNTER — Inpatient Hospital Stay: Payer: Medicare Other | Attending: Hematology and Oncology | Admitting: Hematology and Oncology

## 2022-06-16 ENCOUNTER — Other Ambulatory Visit: Payer: Self-pay

## 2022-06-16 VITALS — BP 146/58 | HR 77 | Temp 97.7°F | Resp 18 | Ht 63.25 in | Wt 110.8 lb

## 2022-06-16 DIAGNOSIS — N951 Menopausal and female climacteric states: Secondary | ICD-10-CM | POA: Diagnosis not present

## 2022-06-16 DIAGNOSIS — D0512 Intraductal carcinoma in situ of left breast: Secondary | ICD-10-CM | POA: Diagnosis not present

## 2022-06-16 DIAGNOSIS — Z79899 Other long term (current) drug therapy: Secondary | ICD-10-CM | POA: Diagnosis not present

## 2022-06-16 MED ORDER — TAMOXIFEN CITRATE 10 MG PO TABS: 5.0000 mg | ORAL_TABLET | Freq: Every day | ORAL | 0 refills | Status: DC

## 2022-06-16 NOTE — Radiation Completion Notes (Signed)
Patient Name: Isabel Christensen, Isabel Christensen MRN: [id number] Date of Birth: Aug 05, 1945 Referring Physician: Oliver Barre, M.D. Date of Service: 2022-06-16 Radiation Oncologist: Lonie Peak, M.D. [facility] [place]                             RADIATION ONCOLOGY END OF TREATMENT NOTE     Diagnosis: D05.12 Intraductal carcinoma in situ of left breast Staging on 2022-04-03: Ductal carcinoma in situ (DCIS) of left breast T=cTis (DCIS), N=cN0, M=cM0 Intent: Curative     ==========DELIVERED PLANS==========  First Treatment Date: 2022-05-24 - Last Treatment Date: 2022-06-13   Plan Name: Breast_L_BH Site: Breast, Left Technique: 3D Mode: Photon Dose Per Fraction: 2.67 Gy Prescribed Dose (Delivered / Prescribed): 40.05 Gy / 40.05 Gy Prescribed Fxs (Delivered / Prescribed): 15 / 15     ==========ON TREATMENT VISIT DATES========== 2022-05-24, 2022-06-05     ==========UPCOMING VISITS==========       ==========APPENDIX - ON TREATMENT VISIT NOTES==========   See weekly On Treatment Notes is Epic for details.

## 2022-06-16 NOTE — Assessment & Plan Note (Addendum)
04/12/2022:Left lumpectomy: High-grade DCIS cribriform type with necrosis, 1.3 cm, negative for invasive cancer, margins negative ER 100%, PR 100%   Treatment plan: Adjuvant radiation therapy completed 06/13/2022 Followed by antiestrogen therapy with tamoxifen 5 years --------------------------------------------------------------------------------------------------------------------------------------------- Tamoxifen counseling: We discussed the risks and benefits of tamoxifen. These include but not limited to insomnia, hot flashes, mood changes, vaginal dryness, and weight gain. Although rare, serious side effects including endometrial cancer, risk of blood clots were also discussed. We strongly believe that the benefits far outweigh the risks. Patient understands these risks and consented to starting treatment. Planned treatment duration is 5 years. Patient would like to try 5 mg dose for a month and see if she can tolerate it.  Menopausal symptoms: Since she stopped estrogen patch she is having lots of hot flashes and mood swings (even before starting tamoxifen).  Return to clinic in 2 months for survivorship care plan visit

## 2022-06-29 NOTE — Progress Notes (Incomplete)
Isabel Christensen was called today for follow-up after completing radiation to her left breast on 06-13-22.   Pain: Skin:  Fatigue:  ROM:  Lymphedema:  MedOnc F/U: Lillard Anes 08-14-22 Other issues of note:   Pt reports Yes No Comments  Tamoxifen [x]  []  10mg   Letrozole []  [x]    Anastrazole []  [x]    Mammogram []  Date:  [] 

## 2022-07-13 ENCOUNTER — Telehealth: Payer: Self-pay

## 2022-07-13 ENCOUNTER — Ambulatory Visit
Admission: RE | Admit: 2022-07-13 | Discharge: 2022-07-13 | Disposition: A | Discharge status: Home / Self Care | Payer: Medicare Other | Source: Ambulatory Visit | Attending: Radiation Oncology | Admitting: Radiation Oncology

## 2022-07-13 ENCOUNTER — Encounter: Payer: Self-pay | Admitting: Radiation Oncology

## 2022-07-13 NOTE — Telephone Encounter (Signed)
Rn called pt back after reading Dr. Earmon Phoenix note about survivorship program follow up for Tamoxifen. She understood that she would see Lillard Anes NP and not Dr. Pamelia Hoit for her two month follow up.

## 2022-07-13 NOTE — Telephone Encounter (Signed)
Rn called pt for telephone follow up after completing radiation. She was doing well with no needs from the radiation stand point. She felt she did not need her survivorship appointment as she drives [address] one [address] for her appointments here. She already follows up with Dr. Pamelia Hoit in medical oncology. She is having concerns with side effects of Tamoxifen and she does not know how long she will be able to take this medication. She was encouraged to call with any questions or concerns.

## 2022-07-13 NOTE — Telephone Encounter (Signed)
Rn attempted to call pt for follow up for completion of radiation treatments without success. Her husband answered the home phone and stated she was headed to the office to see [place]. Rn obtained pt cell phone number and left message for pt that this was a telephone follow up. Rn also called front desk to have them let her know as well. Rn will attempt to call back later for follow up.

## 2022-07-25 ENCOUNTER — Ambulatory Visit (INDEPENDENT_AMBULATORY_CARE_PROVIDER_SITE_OTHER): Payer: Medicare Other | Admitting: Internal Medicine

## 2022-07-25 ENCOUNTER — Encounter: Payer: Self-pay | Admitting: Internal Medicine

## 2022-07-25 VITALS — BP 120/68 | HR 70 | Temp 98.1°F | Ht 63.25 in | Wt 109.0 lb

## 2022-07-25 DIAGNOSIS — IMO0001 Reserved for inherently not codable concepts without codable children: Secondary | ICD-10-CM

## 2022-07-25 DIAGNOSIS — E559 Vitamin D deficiency, unspecified: Secondary | ICD-10-CM

## 2022-07-25 DIAGNOSIS — R21 Rash and other nonspecific skin eruption: Secondary | ICD-10-CM | POA: Diagnosis not present

## 2022-07-25 DIAGNOSIS — E7800 Pure hypercholesterolemia, unspecified: Secondary | ICD-10-CM

## 2022-07-25 DIAGNOSIS — Z0001 Encounter for general adult medical examination with abnormal findings: Secondary | ICD-10-CM | POA: Diagnosis not present

## 2022-07-25 DIAGNOSIS — I10 Essential (primary) hypertension: Secondary | ICD-10-CM

## 2022-07-25 DIAGNOSIS — E538 Deficiency of other specified B group vitamins: Secondary | ICD-10-CM

## 2022-07-25 DIAGNOSIS — R739 Hyperglycemia, unspecified: Secondary | ICD-10-CM | POA: Diagnosis not present

## 2022-07-25 DIAGNOSIS — F17200 Nicotine dependence, unspecified, uncomplicated: Secondary | ICD-10-CM

## 2022-07-25 DIAGNOSIS — Z8639 Personal history of other endocrine, nutritional and metabolic disease: Secondary | ICD-10-CM

## 2022-07-25 LAB — CBC WITH DIFFERENTIAL/PLATELET
Basophils Absolute: 0 10*3/uL (ref 0.0–0.1)
Basophils Relative: 0.5 % (ref 0.0–3.0)
Eosinophils Absolute: 0.3 10*3/uL (ref 0.0–0.7)
Eosinophils Relative: 3.8 % (ref 0.0–5.0)
HCT: 42 % (ref 36.0–46.0)
Hemoglobin: 14 g/dL (ref 12.0–15.0)
Lymphocytes Relative: 19.8 % (ref 12.0–46.0)
Lymphs Abs: 1.7 10*3/uL (ref 0.7–4.0)
MCHC: 33.3 g/dL (ref 30.0–36.0)
MCV: 97.7 fl (ref 78.0–100.0)
Monocytes Absolute: 0.7 10*3/uL (ref 0.1–1.0)
Monocytes Relative: 8.5 % (ref 3.0–12.0)
Neutro Abs: 5.7 10*3/uL (ref 1.4–7.7)
Neutrophils Relative %: 67.4 % (ref 43.0–77.0)
Platelets: 363 10*3/uL (ref 150.0–400.0)
RBC: 4.3 Mil/uL (ref 3.87–5.11)
RDW: 13.1 % (ref 11.5–15.5)
WBC: 8.5 10*3/uL (ref 4.0–10.5)

## 2022-07-25 LAB — TSH: TSH: 2.8 u[IU]/mL (ref 0.35–5.50)

## 2022-07-25 LAB — URINALYSIS, ROUTINE W REFLEX MICROSCOPIC
Bilirubin Urine: NEGATIVE
Leukocytes,Ua: NEGATIVE
Nitrite: NEGATIVE
Specific Gravity, Urine: 1.015 (ref 1.000–1.030)
Total Protein, Urine: NEGATIVE
Urine Glucose: NEGATIVE
Urobilinogen, UA: 0.2 (ref 0.0–1.0)
pH: 6 (ref 5.0–8.0)

## 2022-07-25 LAB — BASIC METABOLIC PANEL
BUN: 10 mg/dL (ref 6–23)
CO2: 27 mEq/L (ref 19–32)
Calcium: 9.7 mg/dL (ref 8.4–10.5)
Chloride: 99 mEq/L (ref 96–112)
Creatinine, Ser: 0.64 mg/dL (ref 0.40–1.20)
GFR: 85.3 mL/min (ref >60.00)
Glucose, Bld: 96 mg/dL (ref 70–99)
Potassium: 4 mEq/L (ref 3.5–5.1)
Sodium: 135 mEq/L (ref 135–145)

## 2022-07-25 LAB — LIPID PANEL
Cholesterol: 237 mg/dL — ABNORMAL HIGH (ref 0–200)
HDL: 83.2 mg/dL (ref >39.00)
LDL Cholesterol: 126 mg/dL — ABNORMAL HIGH (ref 0–99)
NonHDL: 153.84
Total CHOL/HDL Ratio: 3
Triglycerides: 141 mg/dL (ref 0.0–149.0)
VLDL: 28.2 mg/dL (ref 0.0–40.0)

## 2022-07-25 LAB — HEPATIC FUNCTION PANEL
ALT: 10 U/L (ref 0–35)
AST: 18 U/L (ref 0–37)
Albumin: 4.2 g/dL (ref 3.5–5.2)
Alkaline Phosphatase: 76 U/L (ref 39–117)
Bilirubin, Direct: 0.1 mg/dL (ref 0.0–0.3)
Total Bilirubin: 0.3 mg/dL (ref 0.2–1.2)
Total Protein: 7 g/dL (ref 6.0–8.3)

## 2022-07-25 LAB — VITAMIN D 25 HYDROXY (VIT D DEFICIENCY, FRACTURES): VITD: 38.2 ng/mL (ref 30.00–100.00)

## 2022-07-25 LAB — HEMOGLOBIN A1C: Hgb A1c MFr Bld: 5.7 % (ref 4.6–6.5)

## 2022-07-25 LAB — VITAMIN B12: Vitamin B-12: 628 pg/mL (ref 211–911)

## 2022-07-25 MED ORDER — AMLODIPINE BESYLATE 5 MG PO TABS: 5.0000 mg | ORAL_TABLET | Freq: Every day | ORAL | 3 refills | Status: DC

## 2022-07-25 MED ORDER — TRIAMCINOLONE ACETONIDE 0.1 % EX CREA: 1.0000 | TOPICAL_CREAM | Freq: Two times a day (BID) | CUTANEOUS | 2 refills | Status: AC

## 2022-07-25 NOTE — Progress Notes (Unsigned)
Patient ID: Isabel Christensen, female   DOB: 1945-11-30, 77 y.o.   MRN: [id number]         Chief Complaint:: wellness exam and hld, smoking, htn, low vit d       HPI:  Isabel Christensen is a 77 y.o. female here for wellness exam; for shingrix at the pharmacy, declines pulm referral for LDCT screening, o/w up to date.  Still smoking, not ready to quit.                Also did walk a 5k walk recently, 4 days after her last radiation tx.  Now with increased mood ssweling, hot flashes, insomnia off the HRT. Isabel Christensen right knee pain arhtritis may need TKR soon.  Sister with cardiac amyloidosis and going home likely to pass very soon.  Pt denies chest pain, increased sob or doe, wheezing, orthopnea, PND, increased LE swelling, palpitations, dizziness or syncope.   Pt denies polydipsia, polyuria, or new focal neuro s/s.    Pt denies fever, wt loss, night sweats, loss of appetite, or other constitutional symptoms  Has worsening mild to mod insomnia.  Also has recurring itchy red nontender somewhat scaly rash to legs left, then right without fever or pain or trauma.     Wt Readings from Last 3 Encounters:  07/25/22 109 lb (49.4 kg)  06/16/22 110 lb 12.8 oz (50.3 kg)  05/10/22 111 lb 2 oz (50.4 kg)   BP Readings from Last 3 Encounters:  07/25/22 120/68  06/16/22 (!) 146/58  05/10/22 (!) 141/75   Immunization History  Administered Date(s) Administered   Pneumococcal Conjugate-13 04/28/2014   Pneumococcal Polysaccharide-23 07/19/2020   Td 02/13/2008   Tdap 07/19/2020   Zoster, Live 02/13/2008   Health Maintenance Due  Topic Date Due   Medicare Annual Wellness (AWV)  12/30/2021      Past Medical History:  Diagnosis Date   Brain aneurysm    DIVERTICULOSIS, COLON 06/05/2007   Qualifier: Diagnosis of  By: Jonny Ruiz MD, Len Blalock    GERD 06/05/2007   Qualifier: Diagnosis of  By: Maris Berger    GOITER 03/03/2008   Qualifier: Diagnosis of  By: Jonny Ruiz MD, Len Blalock    Hematuria    FOLLOWED BY  UROLOGIST FOR THIS   HYPERLIPIDEMIA 06/05/2007   Qualifier: Diagnosis of  By: Jonny Ruiz MD, Len Blalock    Hypertension    Osteopenia 04/2018   T score -1.4 FRAX 9.5% / 2.6%   Vitamin D deficiency    Past Surgical History:  Procedure Laterality Date   BREAST BIOPSY Left 03/16/2022   MM LT BREAST BX W LOC DEV 1ST LESION IMAGE BX SPEC STEREO GUIDE 03/16/2022 [facility]   BREAST BIOPSY Left 03/16/2022   MM LT BREAST BX W LOC DEV EA AD LESION IMG BX SPEC STEREO GUIDE 03/16/2022 [facility]   BREAST BIOPSY  04/11/2022   MM LT RADIOACTIVE SEED LOC MAMMO GUIDE 04/11/2022 [facility]   BREAST BIOPSY  04/11/2022   MM LT RADIOACTIVE SEED EA ADD LESION LOC MAMMO GUIDE 04/11/2022 [facility]   BREAST LUMPECTOMY WITH RADIOACTIVE SEED LOCALIZATION Left 04/12/2022   Procedure: LEFT BREAST BRACKETED LUMPECTOMY WITH RADIOACTIVE SEED LOCALIZATION;  Surgeon: Griselda Miner, MD;  Location: [facility];  Service: General;  Laterality: Left;   cateract surgery     COLONOSCOPY  2011   dental implant     NOSE SURGERY     OOPHORECTOMY     VAGINAL HYSTERECTOMY  1976  FOR STERILIZATION    reports that she has been smoking cigarettes. She has been smoking an average of .5 packs per day. She has never used smokeless tobacco. She reports current alcohol use of about 10.0 standard drinks of alcohol per week. She reports that she does not use drugs. family history includes Breast cancer in her mother and sister; Cancer in her sister and sister; Diabetes in her mother; Heart disease in her mother; Hypertension in her mother. Allergies  Allergen Reactions   Aspirin     REACTION: brain aneurysm   Black Cohosh     REACTION: hives   Promethazine Hcl     REACTION: rash   Current Outpatient Medications on File Prior to Visit  Medication Sig Dispense Refill   celecoxib (CELEBREX) 100 MG capsule Take 1 capsule (100 mg total) by mouth 2 (two) times daily. 30 capsule 11   cholecalciferol  (VITAMIN D3) 25 MCG (1000 UNIT) tablet Take 1,000 Units by mouth daily.     Misc Natural Products (LUTEIN 20) CAPS Take 1 tablet by mouth daily.     Multiple Vitamin (MULTIVITAMIN) tablet Take 1 tablet by mouth daily.     lansoprazole (PREVACID) 15 MG capsule TAKE 1 CAPSULE EVERY DAY (Patient not taking: Reported on 07/25/2022) 90 capsule 3   psyllium (METAMUCIL) 58.6 % packet Take 1 packet by mouth daily.     tamoxifen (NOLVADEX) 10 MG tablet Take 0.5 tablets (5 mg total) by mouth daily. (Patient not taking: Reported on 07/25/2022) 30 tablet 0   No current facility-administered medications on file prior to visit.        ROS:  All others reviewed and negative.  Objective        PE:  BP 120/68 (BP Location: Right Arm, Patient Position: Sitting, Cuff Size: Normal)   Pulse 70   Temp 98.1 F (36.7 C) (Oral)   Ht 5' 3.25" (1.607 m)   Wt 109 lb (49.4 kg)   SpO2 99%   BMI 19.16 kg/m                 Constitutional: Pt appears in NAD               HENT: Head: NCAT.                Right Ear: External ear normal.                 Left Ear: External ear normal.                Eyes: . Pupils are equal, round, and reactive to light. Conjunctivae and EOM are normal               Nose: without d/c or deformity               Neck: Neck supple. Gross normal ROM               Cardiovascular: Normal rate and regular rhythm.                 Pulmonary/Chest: Effort normal and breath sounds without rales or wheezing.                Abd:  Soft, NT, ND, + BS, no organomegaly               Neurological: Pt is alert. At baseline orientation, motor grossly intact  Skin: Skin is warm. 1 cm scaly nontender rash to LLE lateral mid leg, no other new lesions, LE edema - none               Psychiatric: Pt behavior is normal without agitation   Micro: none  Cardiac tracings I have personally interpreted today:  none  Pertinent Radiological findings (summarize): none   Lab Results  Component Value  Date   WBC 8.5 07/25/2022   HGB 14.0 07/25/2022   HCT 42.0 07/25/2022   PLT 363.0 07/25/2022   GLUCOSE 96 07/25/2022   CHOL 237 (H) 07/25/2022   TRIG 141.0 07/25/2022   HDL 83.20 07/25/2022   LDLDIRECT 126.0 07/18/2019   LDLCALC 126 (H) 07/25/2022   ALT 10 07/25/2022   AST 18 07/25/2022   NA 135 07/25/2022   K 4.0 07/25/2022   CL 99 07/25/2022   CREATININE 0.64 07/25/2022   BUN 10 07/25/2022   CO2 27 07/25/2022   TSH 2.80 07/25/2022   HGBA1C 5.7 07/25/2022   Assessment/Plan:  Gwyneth Rousselle is a 77 y.o. White or Caucasian [1] female with  has a past medical history of Brain aneurysm, DIVERTICULOSIS, COLON (06/05/2007), GERD (06/05/2007), GOITER (03/03/2008), Hematuria, HYPERLIPIDEMIA (06/05/2007), Hypertension, Osteopenia (04/2018), and Vitamin D deficiency.  Encounter for well adult exam with abnormal findings Age and sex appropriate education and counseling updated with regular exercise and diet Referrals for preventative services - declines pulm referral for LDCT screening Immunizations addressed - for shingrix at pharmacy Smoking counseling  - pt counsled to quit, pt not ready Evidence for depression or other mood disorder - none significant Most recent labs reviewed. I have personally reviewed and have noted: 1) the patient's medical and social history 2) The patient's current medications and supplements 3) The patient's height, weight, and BMI have been recorded in the chart   Hyperlipidemia Lab Results  Component Value Date   LDLCALC 126 (H) 07/25/2022   Uncontrolled, goal ldl < 100, pt for lower cholesterol diet, declines statin for now   Smoking Pt counsled to quit, pt not ready  HTN (hypertension) BP Readings from Last 3 Encounters:  07/25/22 120/68  06/16/22 (!) 146/58  05/10/22 (!) 141/75   Stable, pt to continue medical treatment norvasc 5 mg qd   Vitamin D deficiency Last vitamin D Lab Results  Component Value Date   VD25OH 38.20 07/25/2022    Low, to start oral replacement   Rash C/w dermatitis - for triam cr prn  Followup: No follow-ups on file.  Oliver Barre, MD 07/26/2022 1:09 PM [facility]

## 2022-07-25 NOTE — Patient Instructions (Addendum)
Please have your Shingrix (shingles) shots done at your local pharmacy in June as planned  [facility] to try the higher dose melatonin up to 10 mg if needed for sleep  Please take all new medication as prescribed - the steroid cream as needed for the rashes  Please continue all other medications as before, and refills have been done if requested.  Please have the pharmacy call with any other refills you may need.  Please continue your efforts at being more active, low cholesterol diet, and weight control.  You are otherwise up to date with prevention measures today.  Please keep your appointments with your specialists as you may have planned  Please go to the LAB at the blood drawing area for the tests to be done  You will be contacted by phone if any changes need to be made immediately.  Otherwise, you will receive a letter about your results with an explanation, but please check with MyChart first.  Please remember to sign up for MyChart if you have not done so, as this will be important to you in the future with finding out test results, communicating by private email, and scheduling acute appointments online when needed.  Please make an Appointment to return in 6 months, or sooner if needed, also with Lab Appointment for testing done 3-5 days before at the FIRST FLOOR Lab (so this is for TWO appointments - please see the scheduling desk as you leave)

## 2022-07-26 ENCOUNTER — Encounter: Payer: Self-pay | Admitting: Internal Medicine

## 2022-07-26 DIAGNOSIS — R21 Rash and other nonspecific skin eruption: Secondary | ICD-10-CM | POA: Insufficient documentation

## 2022-07-26 NOTE — Assessment & Plan Note (Signed)
Last vitamin D Lab Results  Component Value Date   VD25OH 38.20 07/25/2022   Low, to start oral replacement

## 2022-07-26 NOTE — Assessment & Plan Note (Signed)
Pt counsled to quit, pt not ready °

## 2022-07-26 NOTE — Assessment & Plan Note (Signed)
C/w dermatitis - for triam cr prn ?

## 2022-07-26 NOTE — Assessment & Plan Note (Signed)
Age and sex appropriate education and counseling updated with regular exercise and diet Referrals for preventative services - declines pulm referral for LDCT screening Immunizations addressed - for shingrix at pharmacy Smoking counseling  - pt counsled to quit, pt not ready Evidence for depression or other mood disorder - none significant Most recent labs reviewed. I have personally reviewed and have noted: 1) the patient's medical and social history 2) The patient's current medications and supplements 3) The patient's height, weight, and BMI have been recorded in the chart

## 2022-07-26 NOTE — Assessment & Plan Note (Signed)
BP Readings from Last 3 Encounters:  07/25/22 120/68  06/16/22 (!) 146/58  05/10/22 (!) 141/75   Stable, pt to continue medical treatment norvasc 5 mg qd

## 2022-07-26 NOTE — Assessment & Plan Note (Signed)
Lab Results  Component Value Date   LDLCALC 126 (H) 07/25/2022   Uncontrolled, goal ldl < 100, pt for lower cholesterol diet, declines statin for now

## 2022-07-27 ENCOUNTER — Encounter: Payer: Self-pay | Admitting: Adult Health

## 2022-07-27 ENCOUNTER — Inpatient Hospital Stay: Payer: Medicare Other | Attending: Hematology and Oncology | Admitting: Adult Health

## 2022-07-27 VITALS — BP 144/68 | HR 76 | Temp 98.1°F | Resp 18 | Ht 63.25 in | Wt 109.2 lb

## 2022-07-27 DIAGNOSIS — D0512 Intraductal carcinoma in situ of left breast: Secondary | ICD-10-CM | POA: Insufficient documentation

## 2022-07-27 DIAGNOSIS — F17210 Nicotine dependence, cigarettes, uncomplicated: Secondary | ICD-10-CM | POA: Insufficient documentation

## 2022-07-27 DIAGNOSIS — Z803 Family history of malignant neoplasm of breast: Secondary | ICD-10-CM | POA: Insufficient documentation

## 2022-07-27 DIAGNOSIS — Z79899 Other long term (current) drug therapy: Secondary | ICD-10-CM | POA: Diagnosis not present

## 2022-07-27 NOTE — Progress Notes (Signed)
SURVIVORSHIP VISIT:  BRIEF ONCOLOGIC HISTORY:  Oncology History  Ductal carcinoma in situ (DCIS) of left breast  03/16/2022 Initial Diagnosis   Screening mammogram detected pleomorphic microcalcifications in the upper inner left breast 6 to 8 cm from the nipple in a loosely grouped/somewhat segmental distribution spanning 2 cm. Left Breast Biopsy UIQ anterior and Posterior: IG-HG DCIS Er 100%, PR 100%   04/03/2022 Cancer Staging   Staging form: Breast, AJCC 8th Edition - Clinical: Stage 0 (cTis (DCIS), cN0, cM0, G3, ER+, PR+, HER2: Not Assessed) - Signed by Serena Croissant, MD on 04/03/2022 Stage prefix: Initial diagnosis Histologic grading system: 3 grade system   04/12/2022 Surgery   Left lumpectomy: High-grade DCIS cribriform type with necrosis, 1.3 cm, negative for invasive cancer, margins negative ER 100%, PR 100%   05/25/2022 - 06/13/2022 Radiation Therapy   Plan Name: Breast_L_BH Site: Breast, Left Technique: 3D Mode: Photon Dose Per Fraction: 2.67 Gy Prescribed Dose (Delivered / Prescribed): 40.05 Gy / 40.05 Gy Prescribed Fxs (Delivered / Prescribed): 15 / 15   06/16/2022 -  Anti-estrogen oral therapy   Tamoxifen     INTERVAL HISTORY:  Isabel Christensen to review her survivorship care plan detailing her treatment course for breast cancer, as well as monitoring long-term side effects of that treatment, education regarding health maintenance, screening, and overall wellness and health promotion.     Overall, Isabel Christensen reports feeling quite well.  She is taking Tamoxifen daily.  She tolerates it moderately well.  She experiences hot flashes and vaginal dryness.  She is tolerating these.    REVIEW OF SYSTEMS:  Review of Systems  Constitutional:  Negative for appetite change, chills, fatigue, fever and unexpected weight change.  HENT:   Negative for hearing loss, lump/mass and trouble swallowing.   Eyes:  Negative for eye problems and icterus.  Respiratory:  Negative for chest tightness,  cough and shortness of breath.   Cardiovascular:  Negative for chest pain, leg swelling and palpitations.  Gastrointestinal:  Negative for abdominal distention, abdominal pain, constipation, diarrhea, nausea and vomiting.  Endocrine: Positive for hot flashes.  Genitourinary:  Negative for difficulty urinating.   Musculoskeletal:  Negative for arthralgias.  Skin:  Negative for itching and rash.  Neurological:  Negative for dizziness, extremity weakness, headaches and numbness.  Hematological:  Negative for adenopathy. Does not bruise/bleed easily.  Psychiatric/Behavioral:  Negative for depression. The patient is not nervous/anxious.    Breast: Denies any new nodularity, masses, tenderness, nipple changes, or nipple discharge.       PAST MEDICAL/SURGICAL HISTORY:  Past Medical History:  Diagnosis Date   Brain aneurysm    DIVERTICULOSIS, COLON 06/05/2007   Qualifier: Diagnosis of  By: Jonny Ruiz MD, Len Blalock    GERD 06/05/2007   Qualifier: Diagnosis of  By: Maris Berger    GOITER 03/03/2008   Qualifier: Diagnosis of  By: Jonny Ruiz MD, Len Blalock    Hematuria    FOLLOWED BY UROLOGIST FOR THIS   HYPERLIPIDEMIA 06/05/2007   Qualifier: Diagnosis of  By: Jonny Ruiz MD, Len Blalock    Hypertension    Osteopenia 04/2018   T score -1.4 FRAX 9.5% / 2.6%   Vitamin D deficiency    Past Surgical History:  Procedure Laterality Date   BREAST BIOPSY Left 03/16/2022   MM LT BREAST BX W LOC DEV 1ST LESION IMAGE BX SPEC STEREO GUIDE 03/16/2022 [facility]   BREAST BIOPSY Left 03/16/2022   MM LT BREAST BX W LOC DEV EA AD LESION IMG BX  SPEC STEREO GUIDE 03/16/2022 [facility]   BREAST BIOPSY  04/11/2022   MM LT RADIOACTIVE SEED LOC MAMMO GUIDE 04/11/2022 [facility]   BREAST BIOPSY  04/11/2022   MM LT RADIOACTIVE SEED EA ADD LESION LOC MAMMO GUIDE 04/11/2022 [facility]   BREAST LUMPECTOMY WITH RADIOACTIVE SEED LOCALIZATION Left 04/12/2022   Procedure: LEFT BREAST BRACKETED LUMPECTOMY  WITH RADIOACTIVE SEED LOCALIZATION;  Surgeon: Chevis Pretty III, MD;  Location: [facility];  Service: General;  Laterality: Left;   cateract surgery     COLONOSCOPY  2011   dental implant     NOSE SURGERY     OOPHORECTOMY     VAGINAL HYSTERECTOMY  1976   FOR STERILIZATION     ALLERGIES:  Allergies  Allergen Reactions   Aspirin     REACTION: brain aneurysm   Black Cohosh     REACTION: hives   Promethazine Hcl     REACTION: rash     CURRENT MEDICATIONS:  Outpatient Encounter Medications as of 07/27/2022  Medication Sig   amLODipine (NORVASC) 5 MG tablet Take 1 tablet (5 mg total) by mouth daily.   celecoxib (CELEBREX) 100 MG capsule Take 1 capsule (100 mg total) by mouth 2 (two) times daily.   cholecalciferol (VITAMIN D3) 25 MCG (1000 UNIT) tablet Take 1,000 Units by mouth daily.   lansoprazole (PREVACID) 15 MG capsule TAKE 1 CAPSULE EVERY DAY (Patient not taking: Reported on 07/25/2022)   Misc Natural Products (LUTEIN 20) CAPS Take 1 tablet by mouth daily.   Multiple Vitamin (MULTIVITAMIN) tablet Take 1 tablet by mouth daily.   psyllium (METAMUCIL) 58.6 % packet Take 1 packet by mouth daily.   tamoxifen (NOLVADEX) 10 MG tablet Take 0.5 tablets (5 mg total) by mouth daily. (Patient not taking: Reported on 07/25/2022)   triamcinolone cream (KENALOG) 0.1 % Apply 1 Application topically 2 (two) times daily.   No facility-administered encounter medications on file as of 07/27/2022.     ONCOLOGIC FAMILY HISTORY:  Family History  Problem Relation Age of Onset   Diabetes Mother    Hypertension Mother    Heart disease Mother    Breast cancer Mother        Age 23   Cancer Sister        MELANOMA   Breast cancer Sister        Age 64   Cancer Sister        Melanoma,Lymphoma   Colon cancer Neg Hx    Colon polyps Neg Hx    Esophageal cancer Neg Hx    Rectal cancer Neg Hx    Stomach cancer Neg Hx      SOCIAL HISTORY:  Social History   Socioeconomic History    Marital status: Married    Spouse name: Not on file   Number of children: Not on file   Years of education: 12   Highest education level: High school graduate  Occupational History   Not on file  Tobacco Use   Smoking status: Every Day    Packs/day: .5    Types: Cigarettes   Smokeless tobacco: Never  Vaping Use   Vaping Use: Never used  Substance and Sexual Activity   Alcohol use: Yes    Alcohol/week: 10.0 standard drinks of alcohol    Types: 10 Standard drinks or equivalent per week   Drug use: No   Sexual activity: Yes    Birth control/protection: Surgical    Comment: Hyst, First IC >34 y/o, <5  Partners  Other Topics Concern   Not on file  Social History Narrative   Not on file   Social Determinants of Health   Financial Resource Strain: Low Risk  (12/30/2020)   Overall Financial Resource Strain (CARDIA)    Difficulty of Paying Living Expenses: Not hard at all  Food Insecurity: No Food Insecurity (12/30/2020)   Hunger Vital Sign    Worried About Running Out of Food in the Last Year: Never true    Ran Out of Food in the Last Year: Never true  Transportation Needs: No Transportation Needs (12/30/2020)   PRAPARE - Administrator, Civil Service (Medical): No    Lack of Transportation (Non-Medical): No  Physical Activity: Sufficiently Active (12/30/2020)   Exercise Vital Sign    Days of Exercise per Week: 5 days    Minutes of Exercise per Session: 30 min  Stress: No Stress Concern Present (12/30/2020)   [employer] of Occupational Health - Occupational Stress Questionnaire    Feeling of Stress : Not at all  Social Connections: Socially Integrated (12/30/2020)   Social Connection and Isolation Panel [NHANES]    Frequency of Communication with Friends and Family: Not on file    Frequency of Social Gatherings with Friends and Family: More than three times a week    Attends Religious Services: More than 4 times per year    Active Member of [employer] or  Organizations: Yes    Attends Engineer, structural: More than 4 times per year    Marital Status: Married  Catering manager Violence: Not At Risk (12/30/2020)   Humiliation, Afraid, Rape, and Kick questionnaire    Fear of Current or Ex-Partner: No    Emotionally Abused: No    Physically Abused: No    Sexually Abused: No     OBSERVATIONS/OBJECTIVE:  BP (!) 144/68 (BP Location: Left Arm, Patient Position: Sitting)   Pulse 76   Temp 98.1 F (36.7 C) (Tympanic)   Resp 18   Ht 5' 3.25" (1.607 m)   Wt 109 lb 3.2 oz (49.5 kg)   SpO2 99%   BMI 19.19 kg/m  GENERAL: Patient is a well appearing female in no acute distress HEENT:  Sclerae anicteric.  Oropharynx clear and moist. No ulcerations or evidence of oropharyngeal candidiasis. Neck is supple.  NODES:  No cervical, supraclavicular, or axillary lymphadenopathy palpated.  BREAST EXAM: Left breast status postlumpectomy and radiation no sign of local recurrence right breast is benign. LUNGS:  Clear to auscultation bilaterally.  No wheezes or rhonchi. HEART:  Regular rate and rhythm. No murmur appreciated. ABDOMEN:  Soft, nontender.  Positive, normoactive bowel sounds. No organomegaly palpated. MSK:  No focal spinal tenderness to palpation. Full range of motion bilaterally in the upper extremities. EXTREMITIES:  No peripheral edema.   SKIN:  Clear with no obvious rashes or skin changes. No nail dyscrasia. NEURO:  Nonfocal. Well oriented.  Appropriate affect.   LABORATORY DATA:  None for this visit.  DIAGNOSTIC IMAGING:  None for this visit.      ASSESSMENT AND PLAN:  Isabel Christensen is a pleasant 77 y.o. female with Stage 0 left breast DCIS, ER+/PR+, diagnosed in 02/2022, treated with lumpectomy, adjuvant radiation therapy, and anti-estrogen therapy with Tamoxifen beginning in 05/2022.  She presents to the Survivorship Clinic for our initial meeting and routine follow-up post-completion of treatment for breast cancer.    1.  Stage 0 left breast cancer:  Ms. Woehrle is continuing to recover from definitive  treatment for breast cancer. She will follow-up with her medical oncologist, Dr.  Pamelia Hoit in 6 months with history and physical exam per surveillance protocol.  She will continue her anti-estrogen therapy with Tamoxifen. Thus far, she is tolerating the Tamoxifen moderately well, with minimal side effects. Her mammogram is due 01/2023; orders placed today.   Today, a comprehensive survivorship care plan and treatment summary was reviewed with the patient today detailing her breast cancer diagnosis, treatment course, potential late/long-term effects of treatment, appropriate follow-up care with recommendations for the future, and patient education resources.  A copy of this summary, along with a letter will be sent to the patient's primary care provider via mail/fax/In Basket message after today's visit.    2. Bone health:   She was given education on specific activities to promote bone health.  3. Cancer screening:  Due to Ms. Meckler's history and her age, she should receive screening for skin cancers, colon cancer, and gynecologic cancers.  The information and recommendations are listed on the patient's comprehensive care plan/treatment summary and were reviewed in detail with the patient.    4. Health maintenance and wellness promotion: Ms. Trivitt was encouraged to consume 5-7 servings of fruits and vegetables per day. We reviewed the "Nutrition Rainbow" handout.  She was also encouraged to engage in moderate to vigorous exercise for 30 minutes per day most days of the week.  She was instructed to limit her alcohol consumption and continue to abstain from tobacco use.     5. Support services/counseling: It is not uncommon for this period of the patient's cancer care trajectory to be one of many emotions and stressors.   She was given information regarding our available services and encouraged to contact me with any questions or  for help enrolling in any of our support group/programs.    Follow up instructions:    -Return to cancer center in 6 months for f/u with Dr. Pamelia Hoit  -Mammogram due in 01/2023 -She is welcome to return back to the Survivorship Clinic at any time; no additional follow-up needed at this time.  -Consider referral back to survivorship as a long-term survivor for continued surveillance  The patient was provided an opportunity to ask questions and all were answered. The patient agreed with the plan and demonstrated an understanding of the instructions.   Total encounter time:30 minutes*in face-to-face visit time, chart review, lab review, care coordination, order entry, and documentation of the encounter time.    Lillard Anes, NP 07/27/22 1:54 PM Medical Oncology and Hematology [facility] [address] [place], [place] [address] Tel. [phone]    Fax. [phone]  *Total Encounter Time as defined by the Centers for Medicare and Medicaid Services includes, in addition to the face-to-face time of a patient visit (documented in the note above) non-face-to-face time: obtaining and reviewing outside history, ordering and reviewing medications, tests or procedures, care coordination (communications with other health care professionals or caregivers) and documentation in the medical record.

## 2022-07-28 ENCOUNTER — Telehealth: Payer: Self-pay | Admitting: Hematology and Oncology

## 2022-07-28 NOTE — Telephone Encounter (Signed)
Scheduled appointment per 5/30 los. Left voicemail.

## 2022-08-13 ENCOUNTER — Other Ambulatory Visit: Payer: Self-pay | Admitting: Hematology and Oncology

## 2022-08-14 ENCOUNTER — Encounter: Payer: Medicare Other | Admitting: Adult Health

## 2022-10-20 ENCOUNTER — Telehealth: Payer: Self-pay

## 2022-10-20 NOTE — Telephone Encounter (Signed)
Patient left VM wanting to know if it was normal for the breast she received radiation to (left) to be slightly warmer to the touch than her non-irradiated breast (right). She denied any redness, pain, or swelling to left breast, just that it felt warmer to the touch and was about 1 degree warmer when she held an oral thermometer under each breast fold.   Returned patients call and assured her that superficial temperature differences to tissue that has received radiation are expected , and can persist for several years after completing radiation. Encouraged her to reach out with any additional questions/concerns, and to continue her routine mammograms and follow-up with her medical oncology team. Patient verbalized understanding and appreciation of call.

## 2022-11-09 ENCOUNTER — Other Ambulatory Visit: Payer: Self-pay | Admitting: General Surgery

## 2022-11-09 DIAGNOSIS — D0512 Intraductal carcinoma in situ of left breast: Secondary | ICD-10-CM

## 2022-12-08 ENCOUNTER — Ambulatory Visit
Admission: RE | Admit: 2022-12-08 | Discharge: 2022-12-08 | Disposition: A | Discharge status: Home / Self Care | Payer: Medicare Other | Source: Ambulatory Visit | Attending: General Surgery | Admitting: General Surgery

## 2022-12-08 ENCOUNTER — Other Ambulatory Visit: Payer: Self-pay | Admitting: General Surgery

## 2022-12-08 DIAGNOSIS — D0512 Intraductal carcinoma in situ of left breast: Secondary | ICD-10-CM

## 2023-01-08 ENCOUNTER — Other Ambulatory Visit: Payer: Self-pay | Admitting: Internal Medicine

## 2023-01-08 ENCOUNTER — Other Ambulatory Visit (INDEPENDENT_AMBULATORY_CARE_PROVIDER_SITE_OTHER): Payer: Medicare Other

## 2023-01-08 DIAGNOSIS — E7800 Pure hypercholesterolemia, unspecified: Secondary | ICD-10-CM

## 2023-01-08 DIAGNOSIS — R739 Hyperglycemia, unspecified: Secondary | ICD-10-CM

## 2023-01-08 DIAGNOSIS — E559 Vitamin D deficiency, unspecified: Secondary | ICD-10-CM

## 2023-01-08 LAB — BASIC METABOLIC PANEL
BUN: 12 mg/dL (ref 6–23)
CO2: 27 meq/L (ref 19–32)
Calcium: 9.4 mg/dL (ref 8.4–10.5)
Chloride: 99 meq/L (ref 96–112)
Creatinine, Ser: 0.64 mg/dL (ref 0.40–1.20)
GFR: 85.02 mL/min (ref >60.00)
Glucose, Bld: 86 mg/dL (ref 70–99)
Potassium: 4.4 meq/L (ref 3.5–5.1)
Sodium: 132 meq/L — ABNORMAL LOW (ref 135–145)

## 2023-01-08 LAB — HEPATIC FUNCTION PANEL
ALT: 10 U/L (ref 0–35)
AST: 18 U/L (ref 0–37)
Albumin: 4.1 g/dL (ref 3.5–5.2)
Alkaline Phosphatase: 74 U/L (ref 39–117)
Bilirubin, Direct: 0 mg/dL (ref 0.0–0.3)
Total Bilirubin: 0.3 mg/dL (ref 0.2–1.2)
Total Protein: 6.7 g/dL (ref 6.0–8.3)

## 2023-01-08 LAB — LIPID PANEL
Cholesterol: 250 mg/dL — ABNORMAL HIGH (ref 0–200)
HDL: 78.2 mg/dL (ref >39.00)
LDL Cholesterol: 110 mg/dL — ABNORMAL HIGH (ref 0–99)
NonHDL: 172.27
Total CHOL/HDL Ratio: 3
Triglycerides: 311 mg/dL — ABNORMAL HIGH (ref 0.0–149.0)
VLDL: 62.2 mg/dL — ABNORMAL HIGH (ref 0.0–40.0)

## 2023-01-08 LAB — VITAMIN D 25 HYDROXY (VIT D DEFICIENCY, FRACTURES): VITD: 35.88 ng/mL (ref 30.00–100.00)

## 2023-01-08 LAB — HEMOGLOBIN A1C: Hgb A1c MFr Bld: 5.9 % (ref 4.6–6.5)

## 2023-01-23 ENCOUNTER — Ambulatory Visit (INDEPENDENT_AMBULATORY_CARE_PROVIDER_SITE_OTHER): Payer: Medicare Other | Admitting: Internal Medicine

## 2023-01-23 ENCOUNTER — Encounter: Payer: Self-pay | Admitting: *Deleted

## 2023-01-23 ENCOUNTER — Encounter: Payer: Self-pay | Admitting: Internal Medicine

## 2023-01-23 ENCOUNTER — Ambulatory Visit: Payer: Medicare Other | Admitting: Internal Medicine

## 2023-01-23 VITALS — BP 120/72 | HR 73 | Temp 98.3°F | Ht 63.25 in | Wt 111.0 lb

## 2023-01-23 DIAGNOSIS — R232 Flushing: Secondary | ICD-10-CM | POA: Diagnosis not present

## 2023-01-23 DIAGNOSIS — E559 Vitamin D deficiency, unspecified: Secondary | ICD-10-CM

## 2023-01-23 DIAGNOSIS — E538 Deficiency of other specified B group vitamins: Secondary | ICD-10-CM

## 2023-01-23 DIAGNOSIS — E7800 Pure hypercholesterolemia, unspecified: Secondary | ICD-10-CM

## 2023-01-23 DIAGNOSIS — F17200 Nicotine dependence, unspecified, uncomplicated: Secondary | ICD-10-CM

## 2023-01-23 DIAGNOSIS — I10 Essential (primary) hypertension: Secondary | ICD-10-CM

## 2023-01-23 DIAGNOSIS — R002 Palpitations: Secondary | ICD-10-CM | POA: Diagnosis not present

## 2023-01-23 DIAGNOSIS — R739 Hyperglycemia, unspecified: Secondary | ICD-10-CM

## 2023-01-23 MED ORDER — CELECOXIB 100 MG PO CAPS: 100.0000 mg | ORAL_CAPSULE | Freq: Two times a day (BID) | ORAL | 11 refills | Status: AC

## 2023-01-23 NOTE — Progress Notes (Signed)
Patient enrolled for [employer]/ [employer] to ship a 30 day cardiac event monitor to her address on file.  Letter with instructions mailed to patient.

## 2023-01-23 NOTE — Patient Instructions (Signed)
Please call if you change your mind about Repatha shot for cholesterol  Please continue all other medications as before, and refills have been done if requested.  Please have the pharmacy call with any other refills you may need.  Please continue your efforts at being more active, low cholesterol diet, and weight control.  Please keep your appointments with your specialists as you may have planned  You will be contacted regarding the referral for:  Cardiac Event Monitor  Please make an Appointment to return in 6 months, or sooner if needed, also with Lab Appointment for testing done 3-5 days before at the FIRST FLOOR Lab (so this is for TWO appointments - please see the scheduling desk as you leave)

## 2023-01-23 NOTE — Progress Notes (Signed)
Patient ID: Isabel Christensen, female   DOB: 1945/12/16, 77 y.o.   MRN: [id number]        Chief Complaint: follow up hot flashes, palpitations, hld, htn, low vit d       HPI:  Isabel Christensen is a 77 y.o. female here with c/o 3-4 times per day hot flashes.  Pt denies chest pain, increased sob or doe, wheezing, orthopnea, PND, increased LE swelling, dizziness or syncope, but has recurring palpitations several times per wk new in the past 2-3 months   Pt denies polydipsia, polyuria, or new focal neuro s/s.    Pt denies fever, wt loss, night sweats, loss of appetite, or other constitutional symptoms  Still smoking, not ready to quit.  Wt Readings from Last 3 Encounters:  01/23/23 111 lb (50.3 kg)  07/27/22 109 lb 3.2 oz (49.5 kg)  07/25/22 109 lb (49.4 kg)   BP Readings from Last 3 Encounters:  01/23/23 120/72  07/27/22 (!) 144/68  07/25/22 120/68         Past Medical History:  Diagnosis Date   Brain aneurysm    DIVERTICULOSIS, COLON 06/05/2007   Qualifier: Diagnosis of  By: Jonny Ruiz MD, Len Blalock    GERD 06/05/2007   Qualifier: Diagnosis of  By: Maris Berger    GOITER 03/03/2008   Qualifier: Diagnosis of  By: Jonny Ruiz MD, Len Blalock    Hematuria    FOLLOWED BY UROLOGIST FOR THIS   HYPERLIPIDEMIA 06/05/2007   Qualifier: Diagnosis of  By: Jonny Ruiz MD, Len Blalock    Hypertension    Osteopenia 04/2018   T score -1.4 FRAX 9.5% / 2.6%   Vitamin D deficiency    Past Surgical History:  Procedure Laterality Date   BREAST BIOPSY Left 03/16/2022   MM LT BREAST BX W LOC DEV 1ST LESION IMAGE BX SPEC STEREO GUIDE 03/16/2022 [facility]   BREAST BIOPSY Left 03/16/2022   MM LT BREAST BX W LOC DEV EA AD LESION IMG BX SPEC STEREO GUIDE 03/16/2022 [facility]   BREAST BIOPSY  04/11/2022   MM LT RADIOACTIVE SEED LOC MAMMO GUIDE 04/11/2022 [facility]   BREAST BIOPSY  04/11/2022   MM LT RADIOACTIVE SEED EA ADD LESION LOC MAMMO GUIDE 04/11/2022 [facility]   BREAST LUMPECTOMY WITH RADIOACTIVE  SEED LOCALIZATION Left 04/12/2022   Procedure: LEFT BREAST BRACKETED LUMPECTOMY WITH RADIOACTIVE SEED LOCALIZATION;  Surgeon: Griselda Miner, MD;  Location: [facility];  Service: General;  Laterality: Left;   cateract surgery     COLONOSCOPY  2011   dental implant     NOSE SURGERY     OOPHORECTOMY     VAGINAL HYSTERECTOMY  1976   FOR STERILIZATION    reports that she has been smoking cigarettes. She has never used smokeless tobacco. She reports current alcohol use of about 10.0 standard drinks of alcohol per week. She reports that she does not use drugs. family history includes Breast cancer in her mother and sister; Cancer in her sister and sister; Diabetes in her mother; Heart disease in her mother; Hypertension in her mother. Allergies  Allergen Reactions   Aspirin     REACTION: brain aneurysm   Black Cohosh     REACTION: hives   Promethazine Hcl     REACTION: rash   Current Outpatient Medications on File Prior to Visit  Medication Sig Dispense Refill   amLODipine (NORVASC) 5 MG tablet Take 1 tablet (5 mg total) by mouth daily. 90 tablet 3  cholecalciferol (VITAMIN D3) 25 MCG (1000 UNIT) tablet Take 1,000 Units by mouth daily.     lansoprazole (PREVACID) 15 MG capsule TAKE 1 CAPSULE EVERY DAY 90 capsule 3   Misc Natural Products (LUTEIN 20) CAPS Take 1 tablet by mouth daily.     Multiple Vitamin (MULTIVITAMIN) tablet Take 1 tablet by mouth daily.     psyllium (METAMUCIL) 58.6 % packet Take 1 packet by mouth daily.     tamoxifen (NOLVADEX) 10 MG tablet Take 1/2 (one-half) tablet by mouth once daily (Patient not taking: Reported on 01/23/2023) 90 tablet 1   triamcinolone cream (KENALOG) 0.1 % Apply 1 Application topically 2 (two) times daily. (Patient not taking: Reported on 01/23/2023) 30 g 2   No current facility-administered medications on file prior to visit.        ROS:  All others reviewed and negative.  Objective        PE:  BP 120/72 (BP Location: Left  Arm, Patient Position: Sitting, Cuff Size: Normal)   Pulse 73   Temp 98.3 F (36.8 C) (Oral)   Ht 5' 3.25" (1.607 m)   Wt 111 lb (50.3 kg)   SpO2 98%   BMI 19.51 kg/m                 Constitutional: Pt appears in NAD               HENT: Head: NCAT.                Right Ear: External ear normal.                 Left Ear: External ear normal.                Eyes: . Pupils are equal, round, and reactive to light. Conjunctivae and EOM are normal               Nose: without d/c or deformity               Neck: Neck supple. Gross normal ROM               Cardiovascular: Normal rate and regular rhythm.                 Pulmonary/Chest: Effort normal and breath sounds without rales or wheezing.                Abd:  Soft, NT, ND, + BS, no organomegaly               Neurological: Pt is alert. At baseline orientation, motor grossly intact               Skin: Skin is warm. No rashes, no other new lesions, LE edema - none               Psychiatric: Pt behavior is normal without agitation   Micro: none  Cardiac tracings I have personally interpreted today:  none  Pertinent Radiological findings (summarize): none   Lab Results  Component Value Date   WBC 8.5 07/25/2022   HGB 14.0 07/25/2022   HCT 42.0 07/25/2022   PLT 363.0 07/25/2022   GLUCOSE 86 01/08/2023   CHOL 250 (H) 01/08/2023   TRIG 311.0 (H) 01/08/2023   HDL 78.20 01/08/2023   LDLDIRECT 126.0 07/18/2019   LDLCALC 110 (H) 01/08/2023   ALT 10 01/08/2023   AST 18 01/08/2023   NA 132 (L) 01/08/2023   K 4.4  01/08/2023   CL 99 01/08/2023   CREATININE 0.64 01/08/2023   BUN 12 01/08/2023   CO2 27 01/08/2023   TSH 2.80 07/25/2022   HGBA1C 5.9 01/08/2023   Assessment/Plan:  Isabel Christensen is a 77 y.o. White or Caucasian [1] female with  has a past medical history of Brain aneurysm, DIVERTICULOSIS, COLON (06/05/2007), GERD (06/05/2007), GOITER (03/03/2008), Hematuria, HYPERLIPIDEMIA (06/05/2007), Hypertension, Osteopenia (04/2018),  and Vitamin D deficiency.  Vitamin D deficiency Last vitamin D Lab Results  Component Value Date   VD25OH 35.88 01/08/2023   Low, to start oral replacement   Smoking Pt counsled to quit, pt not ready  Palpitations Mild to mod, can't r/o afib, for cardiac event monitor,  to f/u any worsening symptoms or concerns  Hyperlipidemia Lab Results  Component Value Date   LDLCALC 110 (H) 01/08/2023   Uncontrolled, has been statin intolerant, I d/w pt to start repatha or zetia and she will consider fo rnow   HTN (hypertension) BP Readings from Last 3 Encounters:  01/23/23 120/72  07/27/22 (!) 144/68  07/25/22 120/68   Stable, pt to continue medical treatment amlodipine 5 qd   Hot flashes Etiology unclear, d/w to start Veozah, but she wants to d/w oncology as well,  Followup: Return in about 6 months (around 07/23/2023).  Isabel Barre, MD 01/26/2023 5:39 PM  Medical Group [facility] Internal Medicine

## 2023-01-26 ENCOUNTER — Encounter: Payer: Self-pay | Admitting: Internal Medicine

## 2023-01-26 DIAGNOSIS — R232 Flushing: Secondary | ICD-10-CM | POA: Insufficient documentation

## 2023-01-26 DIAGNOSIS — R002 Palpitations: Secondary | ICD-10-CM | POA: Insufficient documentation

## 2023-01-26 NOTE — Assessment & Plan Note (Signed)
Pt counsled to quit, pt not ready °

## 2023-01-26 NOTE — Assessment & Plan Note (Signed)
BP Readings from Last 3 Encounters:  01/23/23 120/72  07/27/22 (!) 144/68  07/25/22 120/68   Stable, pt to continue medical treatment amlodipine 5 qd

## 2023-01-26 NOTE — Assessment & Plan Note (Signed)
Lab Results  Component Value Date   LDLCALC 110 (H) 01/08/2023   Uncontrolled, has been statin intolerant, I d/w pt to start repatha or zetia and she will consider fo rnow

## 2023-01-26 NOTE — Assessment & Plan Note (Signed)
Mild to mod, can't r/o afib, for cardiac event monitor,  to f/u any worsening symptoms or concerns

## 2023-01-26 NOTE — Assessment & Plan Note (Signed)
Etiology unclear, d/w to start Veozah, but she wants to d/w oncology as well,

## 2023-01-26 NOTE — Assessment & Plan Note (Signed)
Last vitamin D Lab Results  Component Value Date   VD25OH 35.88 01/08/2023   Low, to start oral replacement

## 2023-01-29 ENCOUNTER — Inpatient Hospital Stay: Payer: Medicare Other | Admitting: Hematology and Oncology

## 2023-01-29 NOTE — Assessment & Plan Note (Signed)
04/12/2022:Left lumpectomy: High-grade DCIS cribriform type with necrosis, 1.3 cm, negative for invasive cancer, margins negative ER 100%, PR 100%    Treatment plan: Adjuvant radiation therapy completed 06/13/2022 Followed by antiestrogen therapy with tamoxifen 5 years started 06/16/2022 --------------------------------------------------------------------------------------------------------------------------------------------- Tamoxifen toxicities:  Breast cancer surveillance: Breast exam 01/29/2023: Benign Mammogram and ultrasound 12/08/2022: No evidence of breast cancer.  Postlumpectomy fat necrosis at the lumpectomy bed, breast density category B  Return to clinic in 1 year for follow-up

## 2023-02-11 DIAGNOSIS — R002 Palpitations: Secondary | ICD-10-CM | POA: Diagnosis not present

## 2023-02-22 ENCOUNTER — Inpatient Hospital Stay: Payer: Medicare Other | Attending: Hematology and Oncology | Admitting: Hematology and Oncology

## 2023-02-22 VITALS — BP 138/62 | HR 75 | Temp 98.0°F | Resp 16 | Ht 63.25 in | Wt 114.4 lb

## 2023-02-22 DIAGNOSIS — Z79899 Other long term (current) drug therapy: Secondary | ICD-10-CM | POA: Diagnosis not present

## 2023-02-22 DIAGNOSIS — N951 Menopausal and female climacteric states: Secondary | ICD-10-CM | POA: Diagnosis not present

## 2023-02-22 DIAGNOSIS — Z923 Personal history of irradiation: Secondary | ICD-10-CM | POA: Insufficient documentation

## 2023-02-22 DIAGNOSIS — D0512 Intraductal carcinoma in situ of left breast: Secondary | ICD-10-CM

## 2023-02-22 NOTE — Assessment & Plan Note (Signed)
04/12/2022:Left lumpectomy: High-grade DCIS cribriform type with necrosis, 1.3 cm, negative for invasive cancer, margins negative ER 100%, PR 100%    Treatment plan: Adjuvant radiation therapy completed 06/13/2022 Followed by antiestrogen therapy with tamoxifen 5 years --------------------------------------------------------------------------------------------------------------------------------------------- Tamoxifen toxicities: Taking 5 mg daily  Breast cancer surveillance: Mammogram scheduled for 03/14/2023.  Return to clinic in 1 year for follow-up

## 2023-02-22 NOTE — Progress Notes (Signed)
Patient Care Team: Corwin Levins, MD as PCP - General Sherrine Maples Francoise Schaumann, MD as Consulting Physician (Ophthalmology) Serena Croissant, MD as Consulting Physician (Hematology and Oncology) Lonie Peak, MD as Attending Physician (Radiation Oncology) Griselda Miner, MD as Consulting Physician (General Surgery)  DIAGNOSIS:  Encounter Diagnosis  Name Primary?   Ductal carcinoma in situ (DCIS) of left breast Yes    SUMMARY OF ONCOLOGIC HISTORY: Oncology History  Ductal carcinoma in situ (DCIS) of left breast  03/16/2022 Initial Diagnosis   Screening mammogram detected pleomorphic microcalcifications in the upper inner left breast 6 to 8 cm from the nipple in a loosely grouped/somewhat segmental distribution spanning 2 cm. Left Breast Biopsy UIQ anterior and Posterior: IG-HG DCIS Er 100%, PR 100%   04/03/2022 Cancer Staging   Staging form: Breast, AJCC 8th Edition - Clinical: Stage 0 (cTis (DCIS), cN0, cM0, G3, ER+, PR+, HER2: Not Assessed) - Signed by Serena Croissant, MD on 04/03/2022 Stage prefix: Initial diagnosis Histologic grading system: 3 grade system   04/12/2022 Surgery   Left lumpectomy: High-grade DCIS cribriform type with necrosis, 1.3 cm, negative for invasive cancer, margins negative ER 100%, PR 100%   05/25/2022 - 06/13/2022 Radiation Therapy   Plan Name: Breast_L_BH Site: Breast, Left Technique: 3D Mode: Photon Dose Per Fraction: 2.67 Gy Prescribed Dose (Delivered / Prescribed): 40.05 Gy / 40.05 Gy Prescribed Fxs (Delivered / Prescribed): 15 / 15   06/16/2022 -  Anti-estrogen oral therapy   Tamoxifen     CHIEF COMPLIANT: Follow-up after discontinuing tamoxifen  HISTORY OF PRESENT ILLNESS:   History of Present Illness   The patient, with a history of left breast DCIS treated with radiation and tamoxifen, presents with worsening hot flashes. She reports that the hot flashes have been consistent but worsened during two recent colds. The patient stopped taking tamoxifen two  months ago due to the hot flashes. She plans to discuss potential treatments for the hot flashes with her gynecologist in January.  In addition, the patient reports noticing lumps in her breast. She recently had a mammogram and sonogram, which identified these as fatty nodules. The patient reports that these nodules initially felt like a soft strip but have since hardened into small bumps.         ALLERGIES:  is allergic to aspirin, black cohosh, and promethazine hcl.  MEDICATIONS:  Current Outpatient Medications  Medication Sig Dispense Refill   amLODipine (NORVASC) 5 MG tablet Take 1 tablet (5 mg total) by mouth daily. 90 tablet 3   celecoxib (CELEBREX) 100 MG capsule Take 1 capsule (100 mg total) by mouth 2 (two) times daily. 30 capsule 11   cholecalciferol (VITAMIN D3) 25 MCG (1000 UNIT) tablet Take 1,000 Units by mouth daily.     lansoprazole (PREVACID) 15 MG capsule TAKE 1 CAPSULE EVERY DAY 90 capsule 3   Misc Natural Products (LUTEIN 20) CAPS Take 1 tablet by mouth daily.     Multiple Vitamin (MULTIVITAMIN) tablet Take 1 tablet by mouth daily.     psyllium (METAMUCIL) 58.6 % packet Take 1 packet by mouth daily.     tamoxifen (NOLVADEX) 10 MG tablet Take 1/2 (one-half) tablet by mouth once daily (Patient not taking: Reported on 01/23/2023) 90 tablet 1   triamcinolone cream (KENALOG) 0.1 % Apply 1 Application topically 2 (two) times daily. (Patient not taking: Reported on 01/23/2023) 30 g 2   No current facility-administered medications for this visit.    PHYSICAL EXAMINATION: ECOG PERFORMANCE STATUS: 1 - Symptomatic but  completely ambulatory  Vitals:   02/22/23 1026  BP: 138/62  Pulse: 75  Resp: 16  Temp: 98 F (36.7 C)  SpO2: 100%   Filed Weights   02/22/23 1026  Weight: 114 lb 6.4 oz (51.9 kg)    Physical Exam   BREAST: Fluid collection in left breast consistent with fat necrosis. Presence of fatty nodules. No biopsy required.      (exam performed in the presence  of a chaperone)  LABORATORY DATA:  I have reviewed the data as listed    Latest Ref Rng & Units 01/08/2023   12:10 PM 07/25/2022   10:27 AM 07/21/2021   10:58 AM  CMP  Glucose 70 - 99 mg/dL 86  96  87   BUN 6 - 23 mg/dL 12  10  12    Creatinine 0.40 - 1.20 mg/dL 1.61  0.96  0.45   Sodium 135 - 145 mEq/L 132  135  135   Potassium 3.5 - 5.1 mEq/L 4.4  4.0  4.2   Chloride 96 - 112 mEq/L 99  99  100   CO2 19 - 32 mEq/L 27  27  28    Calcium 8.4 - 10.5 mg/dL 9.4  9.7  9.5   Total Protein 6.0 - 8.3 g/dL 6.7  7.0  6.9   Total Bilirubin 0.2 - 1.2 mg/dL 0.3  0.3  0.5   Alkaline Phos 39 - 117 U/L 74  76  68   AST 0 - 37 U/L 18  18  20    ALT 0 - 35 U/L 10  10  12      Lab Results  Component Value Date   WBC 8.5 07/25/2022   HGB 14.0 07/25/2022   HCT 42.0 07/25/2022   MCV 97.7 07/25/2022   PLT 363.0 07/25/2022   NEUTROABS 5.7 07/25/2022    ASSESSMENT & PLAN:  Ductal carcinoma in situ (DCIS) of left breast 04/12/2022:Left lumpectomy: High-grade DCIS cribriform type with necrosis, 1.3 cm, negative for invasive cancer, margins negative ER 100%, PR 100%    Treatment plan: Adjuvant radiation therapy completed 06/13/2022 Followed by antiestrogen therapy with tamoxifen 5 years took it for 4 months and discontinued in October 2024 --------------------------------------------------------------------------------------------------------------------------------------------- Hot flashes: Patient will discuss with her primary care physician about any remedies for it.  She is not willing to take another prescription medication.  Breast cancer surveillance: Mammogram and ultrasound October 2024: Areas of fat necrosis  Return to clinic in 1 year for follow-up      No orders of the defined types were placed in this encounter.  The patient has a good understanding of the overall plan. she agrees with it. she will call with any problems that may develop before the next visit here. Total time spent:  30 mins including face to face time and time spent for planning, charting and co-ordination of care   Tamsen Meek, MD 02/22/23

## 2023-03-12 ENCOUNTER — Encounter: Payer: Medicare Other | Admitting: Obstetrics and Gynecology

## 2023-03-13 ENCOUNTER — Ambulatory Visit: Payer: Medicare Other | Attending: Internal Medicine

## 2023-03-13 ENCOUNTER — Ambulatory Visit (INDEPENDENT_AMBULATORY_CARE_PROVIDER_SITE_OTHER): Payer: Medicare Other | Admitting: Nurse Practitioner

## 2023-03-13 ENCOUNTER — Encounter: Payer: Self-pay | Admitting: Nurse Practitioner

## 2023-03-13 VITALS — BP 124/72 | HR 79 | Ht 63.25 in | Wt 110.0 lb

## 2023-03-13 DIAGNOSIS — Z780 Asymptomatic menopausal state: Secondary | ICD-10-CM

## 2023-03-13 DIAGNOSIS — N951 Menopausal and female climacteric states: Secondary | ICD-10-CM | POA: Diagnosis not present

## 2023-03-13 DIAGNOSIS — Z9189 Other specified personal risk factors, not elsewhere classified: Secondary | ICD-10-CM

## 2023-03-13 DIAGNOSIS — D0512 Intraductal carcinoma in situ of left breast: Secondary | ICD-10-CM | POA: Diagnosis not present

## 2023-03-13 DIAGNOSIS — M85851 Other specified disorders of bone density and structure, right thigh: Secondary | ICD-10-CM | POA: Diagnosis not present

## 2023-03-13 DIAGNOSIS — M85852 Other specified disorders of bone density and structure, left thigh: Secondary | ICD-10-CM

## 2023-03-13 DIAGNOSIS — Z01419 Encounter for gynecological examination (general) (routine) without abnormal findings: Secondary | ICD-10-CM

## 2023-03-13 DIAGNOSIS — R002 Palpitations: Secondary | ICD-10-CM

## 2023-03-13 NOTE — Progress Notes (Signed)
 Isabel Christensen [birth year] [id number]   History:  78 y.o. G1P0010 presents for breast and pelvic exam. Postmenopausal - stopped ERT last year with breast cancer diagnosis. Having hot flashes and night sweats. S/P 1976 TVH BSO for sterilization. Intercourse is painful since stopping estrogen. Using Vit E suppositories and Astroglide. Normal pap history. January 2024 left DCIS ER+/PR+ managed with lumpectomy. Stopped Tamoxifen  due to side effects. HTN, HLD managed by PCP. Smokes 1/2 ppd.  Gynecologic History No LMP recorded. Patient has had a hysterectomy.   Contraception: status post hysterectomy Sexually active: Yes  Health Maintenance Last Pap: 2012. Results were: Normal Last mammogram: 12/08/2022. Results were: Left breast fat necrosis, scheduled tomorrow Last colonoscopy: 08/27/2019. Results were: Tubular adenomas, 3-5 year recall Last Dexa: 04/26/2021. Results were: T-score -1.2, FRAX 9.3% / 2.8%  Past medical history, past surgical history, family history and social history were all reviewed and documented in the EPIC chart. Married. Has 2 stepchildren. Mother and sister diagnosed with breast cancer at age 78.   ROS:  A ROS was performed and pertinent positives and negatives are included.  Exam:  Vitals:   03/13/23 1149  BP: 124/72  Pulse: 79  SpO2: 100%  Weight: 110 lb (49.9 kg)  Height: 5' 3.25 (1.607 m)    Body mass index is 19.33 kg/m.  General appearance:  Normal Thyroid :  Symmetrical, normal in size, without palpable masses or nodularity. Respiratory  Auscultation:  Clear without wheezing or rhonchi Cardiovascular  Auscultation:  Regular rate, without rubs, murmurs or gallops  Edema/varicosities:  Not grossly evident Abdominal  Soft,nontender, without masses, guarding or rebound.  Liver/spleen:  No organomegaly noted  Hernia:  None appreciated  Skin  Inspection:  Grossly normal Breasts: Examined lying and sitting.   Right: Without masses, retractions, nipple  discharge or axillary adenopathy.  Left: ~1 inch mass @ 10 o'clock 4 cm from nipple (fat necrosis on imaging), no retractions, nipple discharge or axillary adenopathy. Pelvic: External genitalia:  no lesions              Urethra:  normal appearing urethra with no masses, tenderness or lesions              Bartholins and Skenes: normal                 Vagina: normal appearing vagina with normal color and discharge, no lesions. Atrophic changes              Cervix: absent Bimanual Exam:  Uterus:  absent              Adnexa: no mass, fullness, tenderness              Rectovaginal: Deferred              Anus:  normal, no lesions  Patient informed chaperone available to be present for breast and pelvic exam. Patient has requested no chaperone to be present. Patient has been advised what will be completed during breast and pelvic exam.   Assessment/Plan:  78 y.o. G1P0010 for breast and pelvic exam.   Encounter for breast and pelvic examination - Education provided on SBEs, importance of preventative screenings, current guidelines, high calcium  diet, regular exercise, and multivitamin daily. Labs with PCP.   Postmenopausal - S/P TVH BSO, stopped ERT last year with breast cancer diagnosis. Having hot flashes and night sweats. Declines [place].   Ductal carcinoma in situ (DCIS) of left breast - January 2024 left DCIS ER+/PR+ managed with lumpectomy.  Stopped Tamoxifen  due to side effects. Mammogram scheduled tomorrow.   Osteopenia of necks of both femurs - T-score -1.2 without elevated FRAX in February 2023. Continue Vitamin D  + Calcium  and regular exercise. Will repeat DXA at 3-year interval (2026).  Menopausal vaginal dryness - using Vit E suppositories and astroglide. Painful intercourse. Recommend coconut oil externally daily and as needed with intercourse.   Screening for cervical cancer - Normal Pap history. No longer screening per guidelines.   Screening for colon cancer - 2021 colonoscopy -  tubular adenomas. Will repeat at 3-5 year interval per GI's recommendation.   Return in about 1 year (around 03/12/2024) for B&P (high risk).     Isabel DELENA Shutter DNP, 12:30 PM 03/13/2023

## 2023-03-14 ENCOUNTER — Ambulatory Visit
Admission: RE | Admit: 2023-03-14 | Discharge: 2023-03-14 | Disposition: A | Discharge status: Home / Self Care | Payer: Medicare Other | Source: Ambulatory Visit | Attending: General Surgery | Admitting: General Surgery

## 2023-03-14 DIAGNOSIS — D0512 Intraductal carcinoma in situ of left breast: Secondary | ICD-10-CM

## 2023-03-14 HISTORY — DX: Personal history of irradiation: Z92.3

## 2023-03-15 ENCOUNTER — Encounter: Payer: Self-pay | Admitting: Internal Medicine

## 2023-03-15 ENCOUNTER — Other Ambulatory Visit: Payer: Self-pay | Admitting: Internal Medicine

## 2023-03-15 DIAGNOSIS — R000 Tachycardia, unspecified: Secondary | ICD-10-CM

## 2023-05-23 ENCOUNTER — Encounter: Payer: Self-pay | Admitting: Cardiovascular Disease

## 2023-05-23 ENCOUNTER — Ambulatory Visit: Payer: Medicare Other | Attending: Cardiovascular Disease | Admitting: Cardiovascular Disease

## 2023-05-23 VITALS — BP 130/72 | HR 72 | Ht 63.25 in | Wt 112.6 lb

## 2023-05-23 DIAGNOSIS — I4719 Other supraventricular tachycardia: Secondary | ICD-10-CM

## 2023-05-23 DIAGNOSIS — E7800 Pure hypercholesterolemia, unspecified: Secondary | ICD-10-CM

## 2023-05-23 DIAGNOSIS — I10 Essential (primary) hypertension: Secondary | ICD-10-CM | POA: Diagnosis not present

## 2023-05-23 NOTE — Patient Instructions (Signed)
 Medication Instructions:  No changes *If you need a refill on your cardiac medications before your next appointment, please call your pharmacy*  You can look into the [facility] device by [employer]. This device is purchased by you and it connects to an application you download to your smart phone.  It can detect abnormal heart rhythms and alert you to contact your doctor for further evaluation. The web site is:  [email or web address]    Follow-Up: At [facility], you and your health needs are our priority.  As part of our continuing mission to provide you with exceptional heart care, we have created designated Provider Care Teams.  These Care Teams include your primary Cardiologist (physician) and Advanced Practice Providers (APPs -  Physician Assistants and Nurse Practitioners) who all work together to provide you with the care you need, when you need it.  We recommend signing up for the patient portal called "MyChart".  Sign up information is provided on this After Visit Summary.  MyChart is used to connect with patients for Virtual Visits (Telemedicine).  Patients are able to view lab/test results, encounter notes, upcoming appointments, etc.  Non-urgent messages can be sent to your provider as well.   To learn more about what you can do with MyChart, go to [email or web address].    Your next appointment:   1 year(s)  Provider:   Dr Royann Shivers

## 2023-05-28 NOTE — Progress Notes (Signed)
 Cardiology Office Note:    Date:  05/28/2023   ID:  Isabel Christensen, DOB 06-Apr-1945, MRN [id number]  PCP:  Corwin Levins, MD   [facility] Providers Cardiologist:  None     Referring MD: Corwin Levins, MD   Chief Complaint  Patient presents with   Consult  Isabel Christensen is a 78 y.o. female who is being seen today for the evaluation of arrhythmia at the request of Corwin Levins, MD.   History of Present Illness:    Isabel Christensen is a 78 y.o. female  who presents with concerns about heart rhythm abnormalities. She was referred by Dr. Jonny Ruiz for evaluation of heart rhythm abnormalities.  She experiences episodes of palpitations and a racing heartbeat, each lasting about half an hour. These episodes were concerning due to her history of premature ventricular contractions (PVCs), although she noted a different sensation this time. These episodes which coincided with a period of illness and inactivity due to a cold. The episodes have since resolved. No current episodes of palpitations or racing heartbeat, and no current symptoms of atrial fibrillation.  She wore a monitor during this period, showing rate related BBB and brief paroxysmal atrial tachycardia.  She has a history of high cholesterol and prediabetes. Despite being lean and active, her recent lab work indicated high triglycerides and a hemoglobin A1c in the prediabetes range. She consumes a diet high in cheese and carbohydrates, including bread and potatoes, and acknowledges a love for these foods. There is no family history of diabetes, but there is a history of high cholesterol.  Her family history includes cardiac issues, with her husband having atrial fibrillation and her sister having amyloid of the heart, which is not hereditary ("wild-type"). Her sister's condition is managed with diuretics.  Past Medical History:  Diagnosis Date   Brain aneurysm    DIVERTICULOSIS, COLON 06/05/2007   Qualifier: Diagnosis of  By: Jonny Ruiz  MD, Len Blalock    GERD 06/05/2007   Qualifier: Diagnosis of  By: Maris Berger    GOITER 03/03/2008   Qualifier: Diagnosis of  By: Jonny Ruiz MD, Len Blalock    Hematuria    FOLLOWED BY UROLOGIST FOR THIS   HYPERLIPIDEMIA 06/05/2007   Qualifier: Diagnosis of  By: Jonny Ruiz MD, Len Blalock    Hypertension    Osteopenia 04/2018   T score -1.4 FRAX 9.5% / 2.6%   Personal history of radiation therapy    Vitamin D deficiency     Past Surgical History:  Procedure Laterality Date   BREAST BIOPSY Left 03/16/2022   MM LT BREAST BX W LOC DEV 1ST LESION IMAGE BX SPEC STEREO GUIDE 03/16/2022 [facility]   BREAST BIOPSY Left 03/16/2022   MM LT BREAST BX W LOC DEV EA AD LESION IMG BX SPEC STEREO GUIDE 03/16/2022 [facility]   BREAST BIOPSY  04/11/2022   MM LT RADIOACTIVE SEED LOC MAMMO GUIDE 04/11/2022 [facility]   BREAST BIOPSY  04/11/2022   MM LT RADIOACTIVE SEED EA ADD LESION LOC MAMMO GUIDE 04/11/2022 [facility]   BREAST LUMPECTOMY     BREAST LUMPECTOMY WITH RADIOACTIVE SEED LOCALIZATION Left 04/12/2022   Procedure: LEFT BREAST BRACKETED LUMPECTOMY WITH RADIOACTIVE SEED LOCALIZATION;  Surgeon: Griselda Miner, MD;  Location: [facility];  Service: General;  Laterality: Left;   cateract surgery     COLONOSCOPY  2011   dental implant     NOSE SURGERY     OOPHORECTOMY  VAGINAL HYSTERECTOMY  1976   FOR STERILIZATION    Current Medications: Current Meds  Medication Sig   amLODipine (NORVASC) 5 MG tablet Take 1 tablet (5 mg total) by mouth daily.   celecoxib (CELEBREX) 100 MG capsule Take 1 capsule (100 mg total) by mouth 2 (two) times daily.   cholecalciferol (VITAMIN D3) 25 MCG (1000 UNIT) tablet Take 1,000 Units by mouth daily.   lansoprazole (PREVACID) 15 MG capsule TAKE 1 CAPSULE EVERY DAY   Misc Natural Products (LUTEIN 20) CAPS Take 1 tablet by mouth daily.   Multiple Vitamin (MULTIVITAMIN) tablet Take 1 tablet by mouth daily.   psyllium (METAMUCIL)  58.6 % packet Take 1 packet by mouth daily.     Allergies:   Aspirin, Black cohosh, and Promethazine hcl   Family History: The patient's family history includes Breast cancer in her mother and sister; Cancer in her sister and sister; Diabetes in her mother; Heart disease in her mother; Hypertension in her mother. There is no history of Colon cancer, Colon polyps, Esophageal cancer, Rectal cancer, or Stomach cancer.  ROS:   Please see the history of present illness.     All other systems reviewed and are negative.  EKGs/Labs/Other Studies Reviewed:    The following studies were reviewed today:  EKG Interpretation Date/Time:  Wednesday May 23 2023 13:39:14 EDT Ventricular Rate:  72 PR Interval:  148 QRS Duration:  76 QT Interval:  374 QTC Calculation: 409 R Axis:   0  Text Interpretation: Normal sinus rhythm Normal ECG When compared with ECG of 06-Apr-2022 09:29, No significant change was found Confirmed by Oris Staffieri ([id number]) on 05/23/2023 1:49:03 PM    Recent Labs: 07/25/2022: Hemoglobin 14.0; Platelets 363.0; TSH 2.80 01/08/2023: ALT 10; BUN 12; Creatinine, Ser 0.64; Potassium 4.4; Sodium 132  Recent Lipid Panel    Component Value Date/Time   CHOL 250 (H) 01/08/2023 1210   TRIG 311.0 (H) 01/08/2023 1210   HDL 78.20 01/08/2023 1210   CHOLHDL 3 01/08/2023 1210   VLDL 62.2 (H) 01/08/2023 1210   LDLCALC 110 (H) 01/08/2023 1210   LDLDIRECT 126.0 07/18/2019 1501     Risk Assessment/Calculations:             Physical Exam:    VS:  BP 130/72 (BP Location: Left Arm, Patient Position: Sitting, Cuff Size: Small)   Pulse 72   Ht 5' 3.25" (1.607 m)   Wt 112 lb 9.6 oz (51.1 kg)   SpO2 94%   BMI 19.79 kg/m     Wt Readings from Last 3 Encounters:  05/23/23 112 lb 9.6 oz (51.1 kg)  03/13/23 110 lb (49.9 kg)  02/22/23 114 lb 6.4 oz (51.9 kg)     GEN: Appears very leand and fit and younger than stated age. Well nourished, well developed in no acute distress HEENT:  Normal NECK: No JVD; No carotid bruits LYMPHATICS: No lymphadenopathy CARDIAC: RRR, no murmurs, rubs, gallops RESPIRATORY:  Clear to auscultation without rales, wheezing or rhonchi  ABDOMEN: Soft, non-tender, non-distended MUSCULOSKELETAL:  No edema; No deformity  SKIN: Warm and dry NEUROLOGIC:  Alert and oriented x 3 PSYCHIATRIC:  Normal affect   ASSESSMENT:    1. Paroxysmal atrial tachycardia (HCC)   2. Primary hypertension   3. Hypercholesterolemia    PLAN:    In order of problems listed above:  Paroxysmal Atrial Tachycardia: Episodes of rapid heartbeat lasting about 30 minutes were initially suspected to be atrial fibrillation. However, the monitor revealed paroxysmal atrial tachycardia, which  does not carry the stroke risk associated with atrial fibrillation. She is relieved to avoid anticoagulation therapy. The distinction between atrial tachycardia and atrial fibrillation was explained, emphasizing the absence of stroke risk with the former. An echocardiogram is recommended to assess heart structure and left atrium size.  Recommend purchasing a Kardia device for home EKG monitoring Prediabetes: Hemoglobin A1c is in the prediabetes range, despite being lean and active. High carbohydrate intake is suspected as a contributing factor. She is advised to monitor and reduce carbohydrate consumption, particularly starches like potatoes, bread, and pasta. Hyperlipidemia: cholesterol levels are high, with an LDL of 110 mg/dL, but also excellent HDL cholesterol levels. Medication is not advised at this time. She is encouraged to manage cholesterol through diet, particularly by reducing carbohydrate intake.            Medication Adjustments/Labs and Tests Ordered: Current medicines are reviewed at length with the patient today.  Concerns regarding medicines are outlined above.  Orders Placed This Encounter  Procedures   EKG 12-Lead   No orders of the defined types were placed in this  encounter.   Patient Instructions  Medication Instructions:  No changes *If you need a refill on your cardiac medications before your next appointment, please call your pharmacy*  You can look into the [facility] device by [employer]. This device is purchased by you and it connects to an application you download to your smart phone.  It can detect abnormal heart rhythms and alert you to contact your doctor for further evaluation. The web site is:  [email or web address].alivecor.[email or web address]    Follow-Up: At [facility], you and your health needs are our priority.  As part of our continuing mission to provide you with exceptional heart care, we have created designated Provider Care Teams.  These Care Teams include your primary Cardiologist (physician) and Advanced Practice Providers (APPs -  Physician Assistants and Nurse Practitioners) who all work together to provide you with the care you need, when you need it.  We recommend signing up for the patient portal called "MyChart".  Sign up information is provided on this After Visit Summary.  MyChart is used to connect with patients for Virtual Visits (Telemedicine).  Patients are able to view lab/test results, encounter notes, upcoming appointments, etc.  Non-urgent messages can be sent to your provider as well.   To learn more about what you can do with MyChart, go to [email or web address].    Your next appointment:   1 year(s)  Provider:   Dr Royann Shivers          Signed, Thurmon Fair, MD  05/28/2023 9:56 PM    [facility]

## 2023-07-18 ENCOUNTER — Other Ambulatory Visit (INDEPENDENT_AMBULATORY_CARE_PROVIDER_SITE_OTHER)

## 2023-07-18 DIAGNOSIS — E538 Deficiency of other specified B group vitamins: Secondary | ICD-10-CM

## 2023-07-18 DIAGNOSIS — E7800 Pure hypercholesterolemia, unspecified: Secondary | ICD-10-CM | POA: Diagnosis not present

## 2023-07-18 DIAGNOSIS — R739 Hyperglycemia, unspecified: Secondary | ICD-10-CM | POA: Diagnosis not present

## 2023-07-18 DIAGNOSIS — E559 Vitamin D deficiency, unspecified: Secondary | ICD-10-CM | POA: Diagnosis not present

## 2023-07-18 LAB — VITAMIN B12: Vitamin B-12: 701 pg/mL (ref 211–911)

## 2023-07-18 LAB — CBC WITH DIFFERENTIAL/PLATELET
Basophils Absolute: 0.1 10*3/uL (ref 0.0–0.1)
Basophils Relative: 0.7 % (ref 0.0–3.0)
Eosinophils Absolute: 0.3 10*3/uL (ref 0.0–0.7)
Eosinophils Relative: 4.1 % (ref 0.0–5.0)
HCT: 39.9 % (ref 36.0–46.0)
Hemoglobin: 13.8 g/dL (ref 12.0–15.0)
Lymphocytes Relative: 29.2 % (ref 12.0–46.0)
Lymphs Abs: 2.3 10*3/uL (ref 0.7–4.0)
MCHC: 34.7 g/dL (ref 30.0–36.0)
MCV: 94.1 fl (ref 78.0–100.0)
Monocytes Absolute: 0.6 10*3/uL (ref 0.1–1.0)
Monocytes Relative: 8 % (ref 3.0–12.0)
Neutro Abs: 4.5 10*3/uL (ref 1.4–7.7)
Neutrophils Relative %: 58 % (ref 43.0–77.0)
Platelets: 336 10*3/uL (ref 150.0–400.0)
RBC: 4.24 Mil/uL (ref 3.87–5.11)
RDW: 12.7 % (ref 11.5–15.5)
WBC: 7.8 10*3/uL (ref 4.0–10.5)

## 2023-07-18 LAB — URINALYSIS, ROUTINE W REFLEX MICROSCOPIC
Bilirubin Urine: NEGATIVE
Ketones, ur: NEGATIVE
Leukocytes,Ua: NEGATIVE
Nitrite: NEGATIVE
Specific Gravity, Urine: 1.015 (ref 1.000–1.030)
Total Protein, Urine: NEGATIVE
Urine Glucose: NEGATIVE
Urobilinogen, UA: 0.2 (ref 0.0–1.0)
pH: 6 (ref 5.0–8.0)

## 2023-07-18 LAB — LIPID PANEL
Cholesterol: 238 mg/dL — ABNORMAL HIGH (ref 0–200)
HDL: 76.9 mg/dL (ref >39.00)
LDL Cholesterol: 136 mg/dL — ABNORMAL HIGH (ref 0–99)
NonHDL: 161.09
Total CHOL/HDL Ratio: 3
Triglycerides: 125 mg/dL (ref 0.0–149.0)
VLDL: 25 mg/dL (ref 0.0–40.0)

## 2023-07-18 LAB — BASIC METABOLIC PANEL WITH GFR
BUN: 12 mg/dL (ref 6–23)
CO2: 26 meq/L (ref 19–32)
Calcium: 9.4 mg/dL (ref 8.4–10.5)
Chloride: 99 meq/L (ref 96–112)
Creatinine, Ser: 0.6 mg/dL (ref 0.40–1.20)
GFR: 86.04 mL/min (ref >60.00)
Glucose, Bld: 79 mg/dL (ref 70–99)
Potassium: 4 meq/L (ref 3.5–5.1)
Sodium: 132 meq/L — ABNORMAL LOW (ref 135–145)

## 2023-07-18 LAB — TSH: TSH: 2.91 u[IU]/mL (ref 0.35–5.50)

## 2023-07-18 LAB — VITAMIN D 25 HYDROXY (VIT D DEFICIENCY, FRACTURES): VITD: 37.13 ng/mL (ref 30.00–100.00)

## 2023-07-18 LAB — HEPATIC FUNCTION PANEL
ALT: 10 U/L (ref 0–35)
AST: 17 U/L (ref 0–37)
Albumin: 4.3 g/dL (ref 3.5–5.2)
Alkaline Phosphatase: 89 U/L (ref 39–117)
Bilirubin, Direct: 0.1 mg/dL (ref 0.0–0.3)
Total Bilirubin: 0.5 mg/dL (ref 0.2–1.2)
Total Protein: 6.9 g/dL (ref 6.0–8.3)

## 2023-07-18 LAB — HEMOGLOBIN A1C: Hgb A1c MFr Bld: 5.7 % (ref 4.6–6.5)

## 2023-07-24 ENCOUNTER — Ambulatory Visit (INDEPENDENT_AMBULATORY_CARE_PROVIDER_SITE_OTHER)

## 2023-07-24 VITALS — Ht 63.25 in | Wt 112.0 lb

## 2023-07-24 DIAGNOSIS — Z0000 Encounter for general adult medical examination without abnormal findings: Secondary | ICD-10-CM | POA: Diagnosis not present

## 2023-07-24 NOTE — Patient Instructions (Signed)
 Isabel Christensen , Thank you for taking time out of your busy schedule to complete your Annual Wellness Visit with me. I enjoyed our conversation and look forward to speaking with you again next year. I, as well as your care team,  appreciate your ongoing commitment to your health goals. Please review the following plan we discussed and let me know if I can assist you in the future. Your Game plan/ To Do List   Follow up Visits: Next Medicare AWV with our clinical staff: N/A   Have you seen your provider in the last 6 months (3 months if uncontrolled diabetes)? Yes Next Office Visit with your provider: 07/25/2023.  Clinician Recommendations:  Aim for 30 minutes of exercise or brisk walking, 6-8 glasses of water, and 5 servings of fruits and vegetables each day. Keep up the good work.      This is a list of the screening recommended for you and due dates:  Health Maintenance  Topic Date Due   Zoster (Shingles) Vaccine (2 of 2) 01/18/2023   Screening for Lung Cancer  07/25/2023*   Flu Shot  09/28/2023   Medicare Annual Wellness Visit  07/23/2024   DTaP/Tdap/Td vaccine (3 - Td or Tdap) 07/20/2030   Pneumonia Vaccine  Completed   DEXA scan (bone density measurement)  Completed   Hepatitis C Screening  Completed   HPV Vaccine  Aged Out   Meningitis B Vaccine  Aged Out   Colon Cancer Screening  Discontinued   COVID-19 Vaccine  Discontinued  *Topic was postponed. The date shown is not the original due date.    Advanced directives: (Copy Requested) Please bring a copy of your health care power of attorney and living will to the office to be added to your chart at your convenience. You can mail to [facility] [address] [place], [place] [address] or email to [email or web address] Advance Care Planning is important because it:  [x]  Makes sure you receive the medical care that is consistent with your values, goals, and preferences  [x]  It provides guidance to your family  and loved ones and reduces their decisional burden about whether or not they are making the right decisions based on your wishes.  Follow the link provided in your after visit summary or read over the paperwork we have mailed to you to help you started getting your Advance Directives in place. If you need assistance in completing these, please reach out to us  so that we can help you!  See attachments for Preventive Care and Fall Prevention Tips.

## 2023-07-24 NOTE — Progress Notes (Signed)
 Subjective:   Isabel Christensen is a 78 y.o. who presents for a Medicare Wellness preventive visit.  As a reminder, Annual Wellness Visits don't include a physical exam, and some assessments may be limited, especially if this visit is performed virtually. We may recommend an in-person follow-up visit with your provider if needed.  Visit Complete: Virtual I connected with  Isabel Christensen on 07/24/23 by a audio enabled telemedicine application and verified that I am speaking with the correct person using two identifiers.  Patient Location: Home  Provider Location: Home Office  I discussed the limitations of evaluation and management by telemedicine. The patient expressed understanding and agreed to proceed.  Vital Signs: Because this visit was a virtual/telehealth visit, some criteria may be missing or patient reported. Any vitals not documented were not able to be obtained and vitals that have been documented are patient reported.  VideoDeclined- This patient declined Librarian, academic. Therefore the visit was completed with audio only.  Persons Participating in Visit: Patient.  AWV Questionnaire: No: Patient Medicare AWV questionnaire was not completed prior to this visit.  Cardiac Risk Factors include: advanced age (>24men, >31 women);hypertension;dyslipidemia;smoking/ tobacco exposure     Objective:     Today's Vitals   07/24/23 1425  Weight: 112 lb (50.8 kg)  Height: 5' 3.25" (1.607 m)   Body mass index is 19.68 kg/m.     07/24/2023    2:31 PM 07/13/2022   10:05 AM 06/16/2022    8:31 AM 05/10/2022    1:04 PM 04/24/2022   11:08 AM 04/12/2022   11:20 AM 03/22/2022    8:39 AM  Advanced Directives  Does Patient Have a Medical Advance Directive? No No No No No No No  Would patient like information on creating a medical advance directive?  No - Patient declined  No - Patient declined No - Patient declined No - Patient declined No - Patient declined     Current Medications (verified) Outpatient Encounter Medications as of 07/24/2023  Medication Sig   amLODipine  (NORVASC ) 5 MG tablet Take 1 tablet (5 mg total) by mouth daily.   celecoxib  (CELEBREX ) 100 MG capsule Take 1 capsule (100 mg total) by mouth 2 (two) times daily. (Patient taking differently: Take 100 mg by mouth as needed.)   cholecalciferol (VITAMIN D3) 25 MCG (1000 UNIT) tablet Take 1,000 Units by mouth daily.   lansoprazole  (PREVACID ) 15 MG capsule TAKE 1 CAPSULE EVERY DAY   Misc Natural Products (LUTEIN 20) CAPS Take 1 tablet by mouth daily.   Multiple Vitamin (MULTIVITAMIN) tablet Take 1 tablet by mouth daily.   psyllium (METAMUCIL) 58.6 % packet Take 1 packet by mouth daily.   triamcinolone  cream (KENALOG ) 0.1 % Apply 1 Application topically 2 (two) times daily.   No facility-administered encounter medications on file as of 07/24/2023.    Allergies (verified) Aspirin, Black cohosh, and Promethazine hcl   History: Past Medical History:  Diagnosis Date   Brain aneurysm    DIVERTICULOSIS, COLON 06/05/2007   Qualifier: Diagnosis of  By: Autry Legions MD, Alveda Aures    GERD 06/05/2007   Qualifier: Diagnosis of  By: Jarold Merlin    GOITER 03/03/2008   Qualifier: Diagnosis of  By: Autry Legions MD, Alveda Aures    Hematuria    FOLLOWED BY UROLOGIST FOR THIS   HYPERLIPIDEMIA 06/05/2007   Qualifier: Diagnosis of  By: Autry Legions MD, Alveda Aures    Hypertension    Osteopenia 04/2018   T score -1.4 FRAX 9.5% /  2.6%   Personal history of radiation therapy    Vitamin D  deficiency    Past Surgical History:  Procedure Laterality Date   BREAST BIOPSY Left 03/16/2022   MM LT BREAST BX W LOC DEV 1ST LESION IMAGE BX SPEC STEREO GUIDE 03/16/2022 [facility]   BREAST BIOPSY Left 03/16/2022   MM LT BREAST BX W LOC DEV EA AD LESION IMG BX SPEC STEREO GUIDE 03/16/2022 [facility]   BREAST BIOPSY  04/11/2022   MM LT RADIOACTIVE SEED LOC MAMMO GUIDE 04/11/2022 [facility]   BREAST  BIOPSY  04/11/2022   MM LT RADIOACTIVE SEED EA ADD LESION LOC MAMMO GUIDE 04/11/2022 [facility]   BREAST LUMPECTOMY     BREAST LUMPECTOMY WITH RADIOACTIVE SEED LOCALIZATION Left 04/12/2022   Procedure: LEFT BREAST BRACKETED LUMPECTOMY WITH RADIOACTIVE SEED LOCALIZATION;  Surgeon: Caralyn Chandler, MD;  Location: [facility];  Service: General;  Laterality: Left;   cateract surgery     COLONOSCOPY  2011   dental implant     NOSE SURGERY     OOPHORECTOMY     VAGINAL HYSTERECTOMY  1976   FOR STERILIZATION   Family History  Problem Relation Age of Onset   Diabetes Mother    Hypertension Mother    Heart disease Mother    Breast cancer Mother        Age 50   Cancer Sister        MELANOMA   Breast cancer Sister        Age 53   Cancer Sister        Melanoma,Lymphoma   Colon cancer Neg Hx    Colon polyps Neg Hx    Esophageal cancer Neg Hx    Rectal cancer Neg Hx    Stomach cancer Neg Hx    Social History   Socioeconomic History   Marital status: Married    Spouse name: John   Number of children: Not on file   Years of education: 12   Highest education level: High school graduate  Occupational History   Occupation: RETIRED  Tobacco Use   Smoking status: Every Day    Current packs/day: 0.50    Types: Cigarettes   Smokeless tobacco: Never   Tobacco comments:    05/23/2023 Patient smokes 1/2 pack cigarettes daily  Vaping Use   Vaping status: Never Used  Substance and Sexual Activity   Alcohol use: Yes    Alcohol/week: 10.0 standard drinks of alcohol    Types: 10 Standard drinks or equivalent per week   Drug use: No   Sexual activity: Yes    Birth control/protection: Surgical    Comment: Hyst, First IC >76 y/o, <5 Partners hx breast cancer 2024  Other Topics Concern   Not on file  Social History Narrative   Lives with husband/2025   Social Drivers of Health   Financial Resource Strain: Low Risk  (07/24/2023)   Overall Financial Resource Strain  (CARDIA)    Difficulty of Paying Living Expenses: Not hard at all  Food Insecurity: No Food Insecurity (07/24/2023)   Hunger Vital Sign    Worried About Running Out of Food in the Last Year: Never true    Ran Out of Food in the Last Year: Never true  Transportation Needs: No Transportation Needs (07/24/2023)   PRAPARE - Administrator, Civil Service (Medical): No    Lack of Transportation (Non-Medical): No  Physical Activity: Inactive (07/24/2023)   Exercise Vital Sign  Days of Exercise per Week: 0 days    Minutes of Exercise per Session: 0 min  Stress: No Stress Concern Present (07/24/2023)   [employer] of Occupational Health - Occupational Stress Questionnaire    Feeling of Stress : Not at all  Social Connections: Socially Integrated (07/24/2023)   Social Connection and Isolation Panel [NHANES]    Frequency of Communication with Friends and Family: More than three times a week    Frequency of Social Gatherings with Friends and Family: More than three times a week    Attends Religious Services: More than 4 times per year    Active Member of [employer] or Organizations: Yes    Attends Banker Meetings: Never    Marital Status: Married    Tobacco Counseling Ready to quit: Not Answered Counseling given: Not Answered Tobacco comments: 05/23/2023 Patient smokes 1/2 pack cigarettes daily    Clinical Intake:  Pre-visit preparation completed: Yes  Pain : No/denies pain     BMI - recorded: 19.68 Nutritional Status: BMI of 19-24  Normal Nutritional Risks: None  Lab Results  Component Value Date   HGBA1C 5.7 07/18/2023   HGBA1C 5.9 01/08/2023   HGBA1C 5.7 07/25/2022     How often do you need to have someone help you when you read instructions, pamphlets, or other written materials from your doctor or pharmacy?: 1 - Never  Interpreter Needed?: No  Information entered by :: Nikcole Eischeid, RMA   Activities of Daily Living     07/24/2023    2:26  PM  In your present state of health, do you have any difficulty performing the following activities:  Hearing? 0  Vision? 0  Difficulty concentrating or making decisions? 0  Walking or climbing stairs? 0  Dressing or bathing? 0  Doing errands, shopping? 0  Preparing Food and eating ? N  Using the Toilet? N  In the past six months, have you accidently leaked urine? N  Do you have problems with loss of bowel control? N  Managing your Medications? N  Managing your Finances? N  Housekeeping or managing your Housekeeping? N    Patient Care Team: Roslyn Coombe, MD as PCP - General Allison Ivory Alayne Allis, MD as Consulting Physician (Ophthalmology) Cameron Cea, MD as Consulting Physician (Hematology and Oncology) Colie Dawes, MD as Attending Physician (Radiation Oncology) Caralyn Chandler, MD as Consulting Physician (General Surgery)  Indicate any recent Medical Services you may have received from other than [facility] providers in the past year (date may be approximate).     Assessment:    This is a routine wellness examination for Emmeline.  Hearing/Vision screen Hearing Screening - Comments:: Denies hearing difficulties   Vision Screening - Comments:: Denies vision issues./ Dr. Roslynn Coombes    Goals Addressed               This Visit's Progress     Patient Stated (pt-stated)   On track     My goal is to maintain my current health status by continuing to eat healthy and stay physically active & socially active. And make it beyond age 47.       Depression Screen     07/24/2023    2:34 PM 01/23/2023   10:01 AM 07/25/2022    9:46 AM 11/17/2021    8:53 AM 07/21/2021   10:06 AM 12/30/2020   10:22 AM 07/19/2020    1:13 PM  PHQ 2/9 Scores  PHQ - 2 Score 0 0 0  0 0 0  PHQ- 9 Score 2    1    Exception Documentation    Patient refusal       Fall Risk     07/24/2023    2:31 PM 01/23/2023   10:01 AM 07/25/2022    9:45 AM 11/17/2021    8:53 AM 07/21/2021   10:06 AM  Fall Risk   Falls in the  past year? 0 0 0 0 0  Number falls in past yr: 0 0 0    Injury with Fall? 0 0 0    Risk for fall due to :  No Fall Risks No Fall Risks No Fall Risks   Follow up Falls evaluation completed;Falls prevention discussed Falls evaluation completed Falls evaluation completed Falls evaluation completed     MEDICARE RISK AT HOME:  Medicare Risk at Home Any stairs in or around the home?: Yes If so, are there any without handrails?: Yes Home free of loose throw rugs in walkways, pet beds, electrical cords, etc?: No (has throw rugs) Adequate lighting in your home to reduce risk of falls?: Yes Life alert?: No Use of a cane, walker or w/c?: No Grab bars in the bathroom?: No Shower chair or bench in shower?: No Elevated toilet seat or a handicapped toilet?: No  TIMED UP AND GO:  Was the test performed?  No  Cognitive Function: Declined/Normal: No cognitive concerns noted by patient or family. Patient alert, oriented, able to answer questions appropriately and recall recent events. No signs of memory loss or confusion.        Immunizations Immunization History  Administered Date(s) Administered   Pneumococcal Conjugate-13 04/28/2014   Pneumococcal Polysaccharide-23 07/19/2020   Td 02/13/2008   Tdap 07/19/2020   Zoster Recombinant(Shingrix) 09/21/2022, 11/23/2022   Zoster, Live 02/13/2008    Screening Tests Health Maintenance  Topic Date Due   Lung Cancer Screening  07/25/2023 (Originally 04/18/2013)   INFLUENZA VACCINE  09/28/2023   Medicare Annual Wellness (AWV)  07/23/2024   DTaP/Tdap/Td (3 - Td or Tdap) 07/20/2030   Pneumonia Vaccine 12+ Years old  Completed   DEXA SCAN  Completed   Hepatitis C Screening  Completed   Zoster Vaccines- Shingrix  Completed   HPV VACCINES  Aged Out   Meningococcal B Vaccine  Aged Out   Colonoscopy  Discontinued   COVID-19 Vaccine  Discontinued    Health Maintenance  There are no preventive care reminders to display for this patient. Health  Maintenance Items Addressed: See Nurse Notes  Additional Screening:  Vision Screening: Recommended annual ophthalmology exams for early detection of glaucoma and other disorders of the eye.  Dental Screening: Recommended annual dental exams for proper oral hygiene  Community Resource Referral / Chronic Care Management: CRR required this visit?  No   CCM required this visit?  No   Plan:    I have personally reviewed and noted the following in the patient's chart:   Medical and social history Use of alcohol, tobacco or illicit drugs  Current medications and supplements including opioid prescriptions. Patient is not currently taking opioid prescriptions. Functional ability and status Nutritional status Physical activity Advanced directives List of other physicians Hospitalizations, surgeries, and ER visits in previous 12 months Vitals Screenings to include cognitive, depression, and falls Referrals and appointments  In addition, I have reviewed and discussed with patient certain preventive protocols, quality metrics, and best practice recommendations. A written personalized care plan for preventive services as well as general preventive health recommendations were  provided to patient.   Nyajah Hyson L Johnney Scarlata, CMA   07/24/2023   After Visit Summary: (Mail) Due to this being a telephonic visit, the after visit summary with patients personalized plan was offered to patient via mail   Notes: Please refer to Routing Comments.

## 2023-07-25 ENCOUNTER — Encounter: Payer: Self-pay | Admitting: Internal Medicine

## 2023-07-25 ENCOUNTER — Ambulatory Visit: Payer: Medicare Other | Admitting: Internal Medicine

## 2023-07-25 VITALS — BP 124/72 | HR 71 | Temp 99.1°F | Ht 63.25 in | Wt 110.0 lb

## 2023-07-25 DIAGNOSIS — E538 Deficiency of other specified B group vitamins: Secondary | ICD-10-CM | POA: Diagnosis not present

## 2023-07-25 DIAGNOSIS — R739 Hyperglycemia, unspecified: Secondary | ICD-10-CM | POA: Diagnosis not present

## 2023-07-25 DIAGNOSIS — F17200 Nicotine dependence, unspecified, uncomplicated: Secondary | ICD-10-CM

## 2023-07-25 DIAGNOSIS — R931 Abnormal findings on diagnostic imaging of heart and coronary circulation: Secondary | ICD-10-CM | POA: Diagnosis not present

## 2023-07-25 DIAGNOSIS — I10 Essential (primary) hypertension: Secondary | ICD-10-CM

## 2023-07-25 DIAGNOSIS — E7800 Pure hypercholesterolemia, unspecified: Secondary | ICD-10-CM | POA: Diagnosis not present

## 2023-07-25 DIAGNOSIS — Z0001 Encounter for general adult medical examination with abnormal findings: Secondary | ICD-10-CM

## 2023-07-25 DIAGNOSIS — E559 Vitamin D deficiency, unspecified: Secondary | ICD-10-CM | POA: Diagnosis not present

## 2023-07-25 NOTE — Patient Instructions (Signed)
 Please let us  know if you would want to start the Repatha shots for cholesterol  Please take OTC Vitamin D3 at 2000 units per day, indefinitely, or 4000 units if you already take the 2000 units  Please quit smoking  Please continue all other medications as before, and refills have been done if requested.  Please have the pharmacy call with any other refills you may need.  Please continue your efforts at being more active, low cholesterol diet, and weight control.  You are otherwise up to date with prevention measures today.  Please keep your appointments with your specialists as you may have planned  Your Lab work was otherwise Very Good!  Please make an Appointment to return for your 1 year visit, or sooner if needed, with Lab testing by Appointment as well, to be done about 3-5 days before at the FIRST FLOOR Lab (so this is for TWO appointments - please see the scheduling desk as you leave)

## 2023-07-25 NOTE — Assessment & Plan Note (Signed)
 Lab Results  Component Value Date   LDLCALC 136 (H) 07/18/2023   Uncontrolled, goal ldl < 70, pt for lower cholesterol diet, declines statin

## 2023-07-25 NOTE — Assessment & Plan Note (Signed)
Age and sex appropriate education and counseling updated with regular exercise and diet Referrals for preventative services - none needed Immunizations addressed - none needed Smoking counseling  - pt counsled to quit, pt not ready Evidence for depression or other mood disorder - none significant Most recent labs reviewed. I have personally reviewed and have noted: 1) the patient's medical and social history 2) The patient's current medications and supplements 3) The patient's height, weight, and BMI have been recorded in the chart

## 2023-07-25 NOTE — Progress Notes (Signed)
 Patient ID: Loreta Blouch, female   DOB: January 23, 1946, 78 y.o.   MRN: [id number]         Chief Complaint:: wellness exam and low vit d, elev calcium  score, hld, smoker, htn       HPI:  Rhylynn Perdomo is a 78 y.o. female here for wellness exam; still smoking, not ready to quit, o/w up to date                        Also Pt denies chest pain, increased sob or doe, wheezing, orthopnea, PND, increased LE swelling, palpitations, dizziness or syncope.   Pt denies polydipsia, polyuria, or new focal neuro s/s.    Pt denies fever, wt loss, night sweats, loss of appetite, or other constitutional symptoms     Wt Readings from Last 3 Encounters:  07/25/23 110 lb (49.9 kg)  07/24/23 112 lb (50.8 kg)  05/23/23 112 lb 9.6 oz (51.1 kg)   BP Readings from Last 3 Encounters:  07/25/23 124/72  05/23/23 130/72  03/13/23 124/72   Immunization History  Administered Date(s) Administered   Pneumococcal Conjugate-13 04/28/2014   Pneumococcal Polysaccharide-23 07/19/2020   Td 02/13/2008   Tdap 07/19/2020   Zoster Recombinant(Shingrix) 09/21/2022, 11/23/2022   Zoster, Live 02/13/2008  There are no preventive care reminders to display for this patient.    Past Medical History:  Diagnosis Date   Brain aneurysm    DIVERTICULOSIS, COLON 06/05/2007   Qualifier: Diagnosis of  By: Autry Legions MD, Alveda Aures    GERD 06/05/2007   Qualifier: Diagnosis of  By: Jarold Merlin    GOITER 03/03/2008   Qualifier: Diagnosis of  By: Autry Legions MD, Alveda Aures    Hematuria    FOLLOWED BY UROLOGIST FOR THIS   HYPERLIPIDEMIA 06/05/2007   Qualifier: Diagnosis of  By: Autry Legions MD, Alveda Aures    Hypertension    Osteopenia 04/2018   T score -1.4 FRAX 9.5% / 2.6%   Personal history of radiation therapy    Vitamin D  deficiency    Past Surgical History:  Procedure Laterality Date   BREAST BIOPSY Left 03/16/2022   MM LT BREAST BX W LOC DEV 1ST LESION IMAGE BX SPEC STEREO GUIDE 03/16/2022 [facility]   BREAST BIOPSY Left 03/16/2022   MM  LT BREAST BX W LOC DEV EA AD LESION IMG BX SPEC STEREO GUIDE 03/16/2022 [facility]   BREAST BIOPSY  04/11/2022   MM LT RADIOACTIVE SEED LOC MAMMO GUIDE 04/11/2022 [facility]   BREAST BIOPSY  04/11/2022   MM LT RADIOACTIVE SEED EA ADD LESION LOC MAMMO GUIDE 04/11/2022 [facility]   BREAST LUMPECTOMY     BREAST LUMPECTOMY WITH RADIOACTIVE SEED LOCALIZATION Left 04/12/2022   Procedure: LEFT BREAST BRACKETED LUMPECTOMY WITH RADIOACTIVE SEED LOCALIZATION;  Surgeon: Caralyn Chandler, MD;  [facility];  Service: General;  Laterality: Left;   cateract surgery     COLONOSCOPY  2011   dental implant     NOSE SURGERY     OOPHORECTOMY     VAGINAL HYSTERECTOMY  1976   FOR STERILIZATION    reports that she has been smoking cigarettes. She has never used smokeless tobacco. She reports current alcohol use of about 10.0 standard drinks of alcohol per week. She reports that she does not use drugs. family history includes Breast cancer in her mother and sister; Cancer in her sister and sister; Diabetes in her mother; Heart disease in her mother; Hypertension in  her mother. Allergies  Allergen Reactions   Aspirin     REACTION: brain aneurysm   Black Cohosh     REACTION: hives   Promethazine Hcl     REACTION: rash   Current Outpatient Medications on File Prior to Visit  Medication Sig Dispense Refill   amLODipine  (NORVASC ) 5 MG tablet Take 1 tablet (5 mg total) by mouth daily. 90 tablet 3   celecoxib  (CELEBREX ) 100 MG capsule Take 1 capsule (100 mg total) by mouth 2 (two) times daily. (Patient taking differently: Take 100 mg by mouth as needed.) 30 capsule 11   cholecalciferol (VITAMIN D3) 25 MCG (1000 UNIT) tablet Take 1,000 Units by mouth daily.     lansoprazole  (PREVACID ) 15 MG capsule TAKE 1 CAPSULE EVERY DAY 90 capsule 3   Misc Natural Products (LUTEIN 20) CAPS Take 1 tablet by mouth daily.     Multiple Vitamin (MULTIVITAMIN) tablet Take 1 tablet by mouth  daily.     psyllium (METAMUCIL) 58.6 % packet Take 1 packet by mouth daily.     triamcinolone  cream (KENALOG ) 0.1 % Apply 1 Application topically 2 (two) times daily. 30 g 2   No current facility-administered medications on file prior to visit.        ROS:  All others reviewed and negative.  Objective        PE:  BP 124/72 (BP Location: Right Arm, Patient Position: Sitting, Cuff Size: Normal)   Pulse 71   Temp 99.1 F (37.3 C) (Oral)   Ht 5' 3.25" (1.607 m)   Wt 110 lb (49.9 kg)   SpO2 98%   BMI 19.33 kg/m                 Constitutional: Pt appears in NAD               HENT: Head: NCAT.                Right Ear: External ear normal.                 Left Ear: External ear normal.                Eyes: . Pupils are equal, round, and reactive to light. Conjunctivae and EOM are normal               Nose: without d/c or deformity               Neck: Neck supple. Gross normal ROM               Cardiovascular: Normal rate and regular rhythm.                 Pulmonary/Chest: Effort normal and breath sounds without rales or wheezing.                Abd:  Soft, NT, ND, + BS, no organomegaly               Neurological: Pt is alert. At baseline orientation, motor grossly intact               Skin: Skin is warm. No rashes, no other new lesions, LE edema - none               Psychiatric: Pt behavior is normal without agitation   Micro: none  Cardiac tracings I have personally interpreted today:  none  Pertinent Radiological findings (summarize): none   Lab Results  Component Value  Date   WBC 7.8 07/18/2023   HGB 13.8 07/18/2023   HCT 39.9 07/18/2023   PLT 336.0 07/18/2023   GLUCOSE 79 07/18/2023   CHOL 238 (H) 07/18/2023   TRIG 125.0 07/18/2023   HDL 76.90 07/18/2023   LDLDIRECT 126.0 07/18/2019   LDLCALC 136 (H) 07/18/2023   ALT 10 07/18/2023   AST 17 07/18/2023   NA 132 (L) 07/18/2023   K 4.0 07/18/2023   CL 99 07/18/2023   CREATININE 0.60 07/18/2023   BUN 12 07/18/2023    CO2 26 07/18/2023   TSH 2.91 07/18/2023   HGBA1C 5.7 07/18/2023   Assessment/Plan:  Josalynn Johndrow is a 78 y.o. White or Caucasian [1] female with  has a past medical history of Brain aneurysm, DIVERTICULOSIS, COLON (06/05/2007), GERD (06/05/2007), GOITER (03/03/2008), Hematuria, HYPERLIPIDEMIA (06/05/2007), Hypertension, Osteopenia (04/2018), Personal history of radiation therapy, and Vitamin D  deficiency.  Encounter for well adult exam with abnormal findings Age and sex appropriate education and counseling updated with regular exercise and diet Referrals for preventative services - none needed Immunizations addressed - none needed Smoking counseling  - pt counsled to quit, pt not ready Evidence for depression or other mood disorder - none significant Most recent labs reviewed. I have personally reviewed and have noted: 1) the patient's medical and social history 2) The patient's current medications and supplements 3) The patient's height, weight, and BMI have been recorded in the chart   Elevated coronary artery calcium  score Goal LDL should be < 70, pt declines repatha for now but will call if wants this  Hyperlipidemia Lab Results  Component Value Date   LDLCALC 136 (H) 07/18/2023   Uncontrolled, goal ldl < 70, pt for lower cholesterol diet, declines statin   Smoker Pt counseled to quit, pt not ready  HTN (hypertension) BP Readings from Last 3 Encounters:  07/25/23 124/72  05/23/23 130/72  03/13/23 124/72   Stable, pt to continue medical treatment norvasc  5 mg qd   Vitamin D  deficiency Last vitamin D  Lab Results  Component Value Date   VD25OH 37.13 07/18/2023   Low, to start oral replacement  Followup: Return in about 1 year (around 07/24/2024).  Rosalia Colonel, MD 07/25/2023 12:10 PM [facility] [address] [facility]

## 2023-07-25 NOTE — Assessment & Plan Note (Signed)
 Goal LDL should be < 70, pt declines repatha for now but will call if wants this

## 2023-07-25 NOTE — Assessment & Plan Note (Signed)
 BP Readings from Last 3 Encounters:  07/25/23 124/72  05/23/23 130/72  03/13/23 124/72   Stable, pt to continue medical treatment norvasc  5 mg qd

## 2023-07-25 NOTE — Assessment & Plan Note (Signed)
 Pt counseled to quit, pt not ready

## 2023-07-25 NOTE — Assessment & Plan Note (Signed)
 Last vitamin D  Lab Results  Component Value Date   VD25OH 37.13 07/18/2023   Low, to start oral replacement

## 2023-09-19 ENCOUNTER — Encounter: Payer: Self-pay | Admitting: Internal Medicine

## 2023-09-19 ENCOUNTER — Ambulatory Visit: Admitting: Internal Medicine

## 2023-09-19 VITALS — HR 78 | Temp 98.0°F | Ht 63.0 in | Wt 108.1 lb

## 2023-09-19 DIAGNOSIS — I10 Essential (primary) hypertension: Secondary | ICD-10-CM

## 2023-09-19 DIAGNOSIS — R058 Other specified cough: Secondary | ICD-10-CM

## 2023-09-19 DIAGNOSIS — J309 Allergic rhinitis, unspecified: Secondary | ICD-10-CM

## 2023-09-19 DIAGNOSIS — F17200 Nicotine dependence, unspecified, uncomplicated: Secondary | ICD-10-CM | POA: Diagnosis not present

## 2023-09-19 DIAGNOSIS — E559 Vitamin D deficiency, unspecified: Secondary | ICD-10-CM | POA: Diagnosis not present

## 2023-09-19 MED ORDER — HYDROCODONE BIT-HOMATROP MBR 5-1.5 MG/5ML PO SOLN: 5.0000 mL | Freq: Four times a day (QID) | ORAL | 0 refills | Status: AC | PRN

## 2023-09-19 MED ORDER — DOXYCYCLINE HYCLATE 100 MG PO TABS: 100.0000 mg | ORAL_TABLET | Freq: Two times a day (BID) | ORAL | 0 refills | Status: DC

## 2023-09-19 NOTE — Assessment & Plan Note (Signed)
 Mild to mod, c/w bronchitis vs pna, declines cxr,  for antibx course doxycyline 100 bid, and cough med prn,  to f/u any worsening symptoms or concerns

## 2023-09-19 NOTE — Patient Instructions (Signed)
 Please take all new medication as prescribed- the antibiotic, and cough medicine as needed  Please also consider taking OTC Allegra  180 mg per day and /or OTC Nasacort  for allergies  Please continue all other medications as before, and refills have been done if requested.  Please have the pharmacy call with any other refills you may need.  Please keep your appointments with your specialists as you may have planned

## 2023-09-19 NOTE — Assessment & Plan Note (Addendum)
 BP Readings from Last 3 Encounters:  07/25/23 124/72  05/23/23 130/72  03/13/23 124/72   Stable, pt to continue medical treatment norvasc  5 qd

## 2023-09-19 NOTE — Assessment & Plan Note (Signed)
 Last vitamin D  Lab Results  Component Value Date   VD25OH 37.13 07/18/2023   Low, to start oral replacement

## 2023-09-19 NOTE — Assessment & Plan Note (Signed)
 Pt encouraged for OTC allegra  180 mg and or Nasacort  asd,  to f/u any worsening symptoms or concerns

## 2023-09-19 NOTE — Assessment & Plan Note (Signed)
 Pt counsled to quit, pt not ready

## 2023-09-19 NOTE — Progress Notes (Signed)
 Chief Complaint: follow up prod cough, allergies, htn, low vit d, smoker       HPI:  Isabel Christensen is a 78 y.o. female Here with acute onset mild to mod 2-3 days ST, HA, general weakness and malaise, with prod cough greenish sputum, but Pt denies chest pain, increased sob or doe, wheezing, orthopnea, PND, increased LE swelling, palpitations, dizziness or syncope.  Does have several wks ongoing nasal allergy symptoms with clearish congestion, itch and sneezing, without fever, pain, ST, cough, swelling or wheezing.   Pt denies polydipsia, polyuria, or new focal neuro s/s. Still smoking, not ready to quit       Wt Readings from Last 3 Encounters:  09/19/23 108 lb 2 oz (49 kg)  07/25/23 110 lb (49.9 kg)  07/24/23 112 lb (50.8 kg)   BP Readings from Last 3 Encounters:  07/25/23 124/72  05/23/23 130/72  03/13/23 124/72         Past Medical History:  Diagnosis Date   Brain aneurysm    DIVERTICULOSIS, COLON 06/05/2007   Qualifier: Diagnosis of  By: Norleen MD, Lynwood ORN    GERD 06/05/2007   Qualifier: Diagnosis of  By: Helga Almarie Caldron    GOITER 03/03/2008   Qualifier: Diagnosis of  By: Norleen MD, Lynwood ORN    Hematuria    FOLLOWED BY UROLOGIST FOR THIS   HYPERLIPIDEMIA 06/05/2007   Qualifier: Diagnosis of  By: Norleen MD, Lynwood ORN    Hypertension    Osteopenia 04/2018   T score -1.4 FRAX 9.5% / 2.6%   Personal history of radiation therapy    Vitamin D  deficiency    Past Surgical History:  Procedure Laterality Date   BREAST BIOPSY Left 03/16/2022   MM LT BREAST BX W LOC DEV 1ST LESION IMAGE BX SPEC STEREO GUIDE 03/16/2022 [facility]   BREAST BIOPSY Left 03/16/2022   MM LT BREAST BX W LOC DEV EA AD LESION IMG BX SPEC STEREO GUIDE 03/16/2022 [facility]   BREAST BIOPSY  04/11/2022   MM LT RADIOACTIVE SEED LOC MAMMO GUIDE 04/11/2022 [facility]   BREAST BIOPSY  04/11/2022   MM LT RADIOACTIVE SEED EA ADD LESION LOC MAMMO GUIDE 04/11/2022 [facility]   BREAST  LUMPECTOMY     BREAST LUMPECTOMY WITH RADIOACTIVE SEED LOCALIZATION Left 04/12/2022   Procedure: LEFT BREAST BRACKETED LUMPECTOMY WITH RADIOACTIVE SEED LOCALIZATION;  Surgeon: Curvin Deward MOULD, MD;  Location: [facility];  Service: General;  Laterality: Left;   cateract surgery     COLONOSCOPY  2011   dental implant     NOSE SURGERY     OOPHORECTOMY     VAGINAL HYSTERECTOMY  1976   FOR STERILIZATION    reports that she has been smoking cigarettes. She has never used smokeless tobacco. She reports current alcohol use of about 10.0 standard drinks of alcohol per week. She reports that she does not use drugs. family history includes Breast cancer in her mother and sister; Cancer in her sister and sister; Diabetes in her mother; Heart disease in her mother; Hypertension in her mother. Allergies  Allergen Reactions   Aspirin     REACTION: brain aneurysm   Black Cohosh     REACTION: hives   Promethazine Hcl     REACTION: rash   Current Outpatient Medications on File Prior to Visit  Medication Sig Dispense Refill   amLODipine  (NORVASC ) 5 MG tablet Take 1 tablet (5 mg total) by mouth daily.  90 tablet 3   celecoxib  (CELEBREX ) 100 MG capsule Take 1 capsule (100 mg total) by mouth 2 (two) times daily. (Patient taking differently: Take 100 mg by mouth as needed.) 30 capsule 11   cholecalciferol (VITAMIN D3) 25 MCG (1000 UNIT) tablet Take 1,000 Units by mouth daily.     lansoprazole  (PREVACID ) 15 MG capsule TAKE 1 CAPSULE EVERY DAY 90 capsule 3   Misc Natural Products (LUTEIN 20) CAPS Take 1 tablet by mouth daily.     Multiple Vitamin (MULTIVITAMIN) tablet Take 1 tablet by mouth daily.     psyllium (METAMUCIL) 58.6 % packet Take 1 packet by mouth daily.     No current facility-administered medications on file prior to visit.        ROS:  All others reviewed and negative.  Objective        PE:  Pulse 78   Temp 98 F (36.7 C) (Temporal)   Ht 5' 3 (1.6 m)   Wt 108 lb 2 oz (49  kg)   SpO2 98%   BMI 19.15 kg/m                 Constitutional: Pt appears mild ill               HENT: Head: NCAT.                Right Ear: External ear normal.                 Left Ear: External ear normal. Bilat tm's with mild erythema.  Max sinus areas non tender.  Pharynx with mild erythema, no exudate                Eyes: . Pupils are equal, round, and reactive to light. Conjunctivae and EOM are normal               Nose: without d/c or deformity               Neck: Neck supple. Gross normal ROM               Cardiovascular: Normal rate and regular rhythm.                 Pulmonary/Chest: Effort normal and breath sounds decreased without rales or wheezing.                               Neurological: Pt is alert. At baseline orientation, motor grossly intact               Skin: Skin is warm. No rashes, no other new lesions, LE edema - none               Psychiatric: Pt behavior is normal without agitation   Micro: none  Cardiac tracings I have personally interpreted today:  none  Pertinent Radiological findings (summarize): none   Lab Results  Component Value Date   WBC 7.8 07/18/2023   HGB 13.8 07/18/2023   HCT 39.9 07/18/2023   PLT 336.0 07/18/2023   GLUCOSE 79 07/18/2023   CHOL 238 (H) 07/18/2023   TRIG 125.0 07/18/2023   HDL 76.90 07/18/2023   LDLDIRECT 126.0 07/18/2019   LDLCALC 136 (H) 07/18/2023   ALT 10 07/18/2023   AST 17 07/18/2023   NA 132 (L) 07/18/2023   K 4.0 07/18/2023   CL 99 07/18/2023   CREATININE 0.60 07/18/2023  BUN 12 07/18/2023   CO2 26 07/18/2023   TSH 2.91 07/18/2023   HGBA1C 5.7 07/18/2023   Assessment/Plan:  Isabel Christensen is a 78 y.o. White or Caucasian [1] female with  has a past medical history of Brain aneurysm, DIVERTICULOSIS, COLON (06/05/2007), GERD (06/05/2007), GOITER (03/03/2008), Hematuria, HYPERLIPIDEMIA (06/05/2007), Hypertension, Osteopenia (04/2018), Personal history of radiation therapy, and Vitamin D   deficiency.  Allergic rhinitis Pt encouraged for OTC allegra  180 mg and or Nasacort  asd,  to f/u any worsening symptoms or concerns  HTN (hypertension) BP Readings from Last 3 Encounters:  07/25/23 124/72  05/23/23 130/72  03/13/23 124/72   Stable, pt to continue medical treatment norvasc  5 qd   Productive cough Mild to mod, c/w bronchitis vs pna, declines cxr,  for antibx course doxycyline 100 bid, and cough med prn,  to f/u any worsening symptoms or concerns  Vitamin D  deficiency Last vitamin D  Lab Results  Component Value Date   VD25OH 37.13 07/18/2023   Low, to start oral replacement   Smoker Pt counsled to quit, pt not ready  Followup: Return if symptoms worsen or fail to improve.  Lynwood Rush, MD 09/19/2023 10:00 AM [facility]

## 2023-11-27 ENCOUNTER — Other Ambulatory Visit: Payer: Self-pay

## 2023-11-27 ENCOUNTER — Other Ambulatory Visit: Payer: Self-pay | Admitting: Internal Medicine

## 2023-12-11 ENCOUNTER — Ambulatory Visit: Admitting: Family Medicine

## 2023-12-11 ENCOUNTER — Encounter: Payer: Self-pay | Admitting: Family Medicine

## 2023-12-11 ENCOUNTER — Ambulatory Visit: Payer: Self-pay

## 2023-12-11 VITALS — BP 138/76 | HR 67 | Temp 97.6°F | Ht 63.0 in | Wt 110.4 lb

## 2023-12-11 DIAGNOSIS — B9689 Other specified bacterial agents as the cause of diseases classified elsewhere: Secondary | ICD-10-CM

## 2023-12-11 DIAGNOSIS — G4709 Other insomnia: Secondary | ICD-10-CM

## 2023-12-11 DIAGNOSIS — J309 Allergic rhinitis, unspecified: Secondary | ICD-10-CM

## 2023-12-11 DIAGNOSIS — J019 Acute sinusitis, unspecified: Secondary | ICD-10-CM

## 2023-12-11 DIAGNOSIS — H9209 Otalgia, unspecified ear: Secondary | ICD-10-CM

## 2023-12-11 MED ORDER — AMOXICILLIN-POT CLAVULANATE 875-125 MG PO TABS: 1.0000 | ORAL_TABLET | Freq: Two times a day (BID) | ORAL | 0 refills | Status: DC

## 2023-12-11 NOTE — Patient Instructions (Addendum)
 I have sent in Augmentin  for you to take twice a day for 10 days. This medication can upset your stomach, so I tell everyone to take it with a meal.  Magnesium glycinate 2 tabs once at bedtime.   GABA at bedtime may help.  May also try valerian root.   Follow-up with me for new or worsening symptoms.

## 2023-12-11 NOTE — Telephone Encounter (Signed)
Pt has appt scheduled today

## 2023-12-11 NOTE — Progress Notes (Signed)
 Acute Office Visit  Subjective:     Patient ID: Isabel Christensen, female    DOB: [birth year], 78 y.o.   MRN: [id number]  Chief Complaint  Patient presents with   Ear Fullness    Ongoing for a month. Having some balance issues, thinks it is sinus related    Ear Fullness     Discussed the use of AI scribe software for clinical note transcription with the patient, who gave verbal consent to proceed.  History of Present Illness Isabel Christensen is a 78 year old female who presents with ear fullness and congestion.  Ear fullness and nasal congestion - Ear fullness, congestion, and stuffiness present - No fever or cough - Throbbing sensation in the neck - Self-medicated with Xyzal, which resulted in dry eyes - Wrist med provided some relief - No recent use of antibiotics; doxycycline  available from July  [place]al allergen [place] - Frequently outdoors  Relevant medical history and allergies - History of breast cancer, brain aneurysm, and sinus issues - Allergies to aspirin, black cohosh, and promethazine     ROS Per HPI      Objective:    BP 138/76 (BP Location: Left Arm, Patient Position: Sitting)   Pulse 67   Temp 97.6 F (36.4 C) (Temporal)   Ht 5' 3 (1.6 m)   Wt 110 lb 6.4 oz (50.1 kg)   SpO2 96%   BMI 19.56 kg/m    Physical Exam Vitals and nursing note reviewed.  Constitutional:      General: She is not in acute distress.    Appearance: Normal appearance. She is normal weight.  HENT:     Head: Normocephalic and atraumatic.     Right Ear: Tympanic membrane and external ear normal.     Left Ear: Tympanic membrane and external ear normal.     Nose: Congestion present.     Right Sinus: Frontal sinus tenderness present. No maxillary sinus tenderness.     Left Sinus: Frontal sinus tenderness present. No maxillary sinus tenderness.     Mouth/Throat:     Mouth: Mucous membranes are moist.     Comments:  Oropharyngeal cobblestoning   Eyes:     Extraocular Movements: Extraocular movements intact.     Pupils: Pupils are equal, round, and reactive to light.  Cardiovascular:     Rate and Rhythm: Normal rate and regular rhythm.     Pulses: Normal pulses.     Heart sounds: Normal heart sounds.  Pulmonary:     Effort: Pulmonary effort is normal. No respiratory distress.     Breath sounds: Normal breath sounds. No wheezing, rhonchi or rales.  Musculoskeletal:        General: Normal range of motion.     Cervical back: Normal range of motion.     Right lower leg: No edema.     Left lower leg: No edema.  Lymphadenopathy:     Cervical: No cervical adenopathy.  Neurological:     General: No focal deficit present.     Mental Status: She is alert and oriented to person, place, and time.  Psychiatric:        Mood and Affect: Mood normal.        Thought Content: Thought content normal.     No results found for any visits on 12/11/23.      Assessment & Plan:   Assessment and Plan Assessment & Plan Acute bacterial sinusitis Recurrent acute  sinusitis, likely exacerbated by ragweed exposure. No fever or significant ear congestion. - Prescribed Augmentin , sent to [facility]. - Advised Claritin for symptom management, noted potential dryness. - Removed old doxycycline  prescription from records.  Allergic rhinitis Chronic allergic rhinitis, likely worsened by ragweed exposure. Sensitivity to antihistamines noted. - Advised Claritin as a less drying option. - Discussed short-term antihistamine use at symptom onset to evaluate effectiveness and side effects.  Insomnia  Chronic insomnia likely due to estrogen withdrawal post-breast cancer treatment. Prefers natural remedies. - Recommended magnesium glycinate with melatonin, available OTC. - Suggested GABA supplements, available OTC. - Discussed valerian root, noted it is often mixed with other ingredients. - Advised against alcohol  and certain teas due to diuretic effects. - Discussed potential use of CBD as an alternative.     No orders of the defined types were placed in this encounter.    Meds ordered this encounter  Medications   amoxicillin -clavulanate (AUGMENTIN ) 875-125 MG tablet    Sig: Take 1 tablet by mouth 2 (two) times daily.    Dispense:  20 tablet    Refill:  0    Return in about 4 weeks (around 01/08/2024), or if symptoms worsen or fail to improve.  Corean LITTIE Ku, FNP

## 2023-12-11 NOTE — Telephone Encounter (Signed)
 FYI Only or Action Required?: Action required by provider: request for appointment.  Patient was last seen in primary care on 09/19/2023 by [name] ORN, MD.  Called Nurse Triage reporting Ear Fullness.  Symptoms began several weeks ago.  Interventions attempted: OTC medications: allergy medication.  Symptoms are: gradually worsening.  Triage Disposition: See PCP When Office is Open (Within 3 Days)  Patient/caregiver understands and will follow disposition?: YesCopied from CRM [id number]. Topic: Clinical - Red Word Triage >> Dec 11, 2023  9:26 AM Suzen RAMAN wrote: Red Word that prompted transfer to Nurse Triage: stuffy ear and off balance . Requesting an appt Reason for Disposition  [1] Ear congestion lasts > 3 days AND [2] no improvement after using Care Advice  (Exception: Ear congestion is a chronic symptom.)  Answer Assessment - Initial Assessment Questions I've had ongoing ear issues. I was just working through until my ear feels so full it's causing my balance to be off. That just started.       1. LOCATION: Which ear is involved?       Right ear 2. SENSATION: Describe how the ear feels. (e.g., stuffy, full, plugged).      Full  3. ONSET:  When did the ear symptoms start?       Several weeks ago  4. PAIN: Do you also have an earache? If Yes, ask: How bad is it? (Scale 0-10; none, mild, moderate or severe)     denies 5. CAUSE: What do you think is causing the ear congestion? (e.g., common cold, nasal allergies, recent flight, recent snorkeling)     allergies 6. OTHER SYMPTOMS: Do you have any other symptoms? (e.g., ear drainage, hay fever symptoms such as sneezing or a clear nasal discharge; cold symptoms such as a cough or runny nose)     Balance is off; ringing in right ear  Protocols used: Ear - Congestion-A-AH

## 2023-12-14 DIAGNOSIS — G4709 Other insomnia: Secondary | ICD-10-CM | POA: Insufficient documentation

## 2023-12-31 NOTE — Telephone Encounter (Signed)
 Copied from CRM [id number]. Topic: Referral - Question >> Dec 31, 2023 10:36 AM Pinkey ORN wrote: Reason for CRM: Referral Request >> Dec 31, 2023 10:37 AM Pinkey ORN wrote: Patient is requesting to be referred over to a ENT specialist. Patient is requesting a follow up call, once completed.

## 2023-12-31 NOTE — Telephone Encounter (Signed)
Ok this referral is done 

## 2024-01-01 NOTE — Telephone Encounter (Signed)
 Called and let Pt know

## 2024-01-03 ENCOUNTER — Ambulatory Visit: Payer: Self-pay

## 2024-01-03 NOTE — Telephone Encounter (Signed)
 This RN left VM to call back to triage symptoms. Attempt #1  Copied from CRM [id number]. Topic: Clinical - Red Word Triage >> Jan 03, 2024 10:46 AM Viola FALCON wrote: Red Word that prompted transfer to Nurse Triage: Patient having nausea and ear pain again - crm sent regarding status of referral to ENT. Please call patient at [phone] (H)

## 2024-01-03 NOTE — Telephone Encounter (Signed)
 FYI Only or Action Required?: FYI only for provider: appointment scheduled on 01/04/2024 at 3 PM.  Patient was last seen in primary care on 12/11/2023 by Alvia Corean CROME, FNP.  Called Nurse Triage reporting Otalgia.  Symptoms began several months ago.  Interventions attempted: Rest, hydration, or home remedies.  Symptoms are: unchanged.  Triage Disposition: No disposition on file.  Patient/caregiver understands and will follow disposition?:   Reason for Disposition  Earache  (Exceptions: Brief ear pain of lasting less than 60 minutes, or earache occurring during air travel.)  Answer Assessment - Initial Assessment Questions Patient reports right ear pain that has been going on and off since July. Patient reports nausea, dizziness at times and decreased hearing to her right ear.   1. LOCATION: Which ear is involved?     Right ear 2. ONSET: When did the ear pain start?      Pain has been going on and off since July 3. SEVERITY: How bad is the pain?  (Scale 1-10; mild, moderate or severe)     2 4. URI SYMPTOMS: Do you have a runny nose or cough?     no 5. FEVER: Do you have a fever? If Yes, ask: What is your temperature, how was it measured, and when did it start?     no 6. CAUSE: Have you been swimming recently?, How often do you use Q-TIPS?, Have you had any recent air travel or scuba diving?     no 7. OTHER SYMPTOMS: Do you have any other symptoms? (e.g., decreased hearing, dizziness, headache, stiff neck, vomiting)     Nausea, dizziness at times, decreased hearing,  Protocols used: Earache-A-AH

## 2024-01-03 NOTE — Telephone Encounter (Signed)
 Copied from CRM [id number]. Topic: Referral - Question >> Jan 03, 2024 10:44 AM Viola F wrote: Reason for CRM: Patient called for information regarding the referral to ENT - please call her at [phone] (H)

## 2024-01-04 ENCOUNTER — Encounter: Payer: Self-pay | Admitting: Internal Medicine

## 2024-01-04 ENCOUNTER — Ambulatory Visit: Admitting: Internal Medicine

## 2024-01-04 VITALS — BP 126/78 | HR 80 | Temp 98.9°F | Ht 63.0 in | Wt 111.0 lb

## 2024-01-04 DIAGNOSIS — I10 Essential (primary) hypertension: Secondary | ICD-10-CM | POA: Diagnosis not present

## 2024-01-04 DIAGNOSIS — F17200 Nicotine dependence, unspecified, uncomplicated: Secondary | ICD-10-CM | POA: Diagnosis not present

## 2024-01-04 DIAGNOSIS — E559 Vitamin D deficiency, unspecified: Secondary | ICD-10-CM | POA: Diagnosis not present

## 2024-01-04 DIAGNOSIS — J3489 Other specified disorders of nose and nasal sinuses: Secondary | ICD-10-CM | POA: Insufficient documentation

## 2024-01-04 DIAGNOSIS — E7800 Pure hypercholesterolemia, unspecified: Secondary | ICD-10-CM | POA: Diagnosis not present

## 2024-01-04 NOTE — Telephone Encounter (Signed)
 Pt being seen today

## 2024-01-04 NOTE — Patient Instructions (Signed)
 Ok to take the OTC Zyrtec 10 mg at bedtime (for sleep and allergies), Flonase  as needed, and Mucinex (the plain one) twice per day  Please continue all other medications as before, and refills have been done if requested.  Please have the pharmacy call with any other refills you may need.  Please keep your appointments with your specialists as you may have planned  You will be contacted regarding the referral for: CT sinus  You will be contacted regarding the referral for: ENT  Please make an Appointment to return in 6 months, or sooner if needed

## 2024-01-04 NOTE — Progress Notes (Signed)
 Patient ID: Isabel Christensen, female   DOB: 05-29-1945, 78 y.o.   MRN: [id number]        Chief Complaint: follow up allergic rhinitis with bilateral obestruction, hld, low vit d, htn        HPI:  Isabel Christensen is a 78 y.o. female here with c/o bilateral maxillary sinus pressure and apparently congestion vs obstruction, ans she has difficulty breathing through the nose and difficulty with saline lavage at home.  Pt denies chest pain, increased sob or doe, wheezing, orthopnea, PND, increased LE swelling, palpitations, dizziness or syncope.   Pt denies polydipsia, polyuria, or new focal neuro s/s.    Pt denies fever, wt loss, night sweats, loss of appetite, or other constitutional symptoms  Still smoking, not ready to quit.         Wt Readings from Last 3 Encounters:  01/04/24 111 lb (50.3 kg)  12/11/23 110 lb 6.4 oz (50.1 kg)  09/19/23 108 lb 2 oz (49 kg)   BP Readings from Last 3 Encounters:  01/04/24 126/78  12/11/23 138/76  07/25/23 124/72         Past Medical History:  Diagnosis Date   Brain aneurysm    DIVERTICULOSIS, COLON 06/05/2007   Qualifier: Diagnosis of  By: Norleen MD, Lynwood ORN    GERD 06/05/2007   Qualifier: Diagnosis of  By: Helga Almarie Caldron    GOITER 03/03/2008   Qualifier: Diagnosis of  By: Norleen MD, Lynwood ORN    Hematuria    FOLLOWED BY UROLOGIST FOR THIS   HYPERLIPIDEMIA 06/05/2007   Qualifier: Diagnosis of  By: Norleen MD, Lynwood ORN    Hypertension    Osteopenia 04/2018   T score -1.4 FRAX 9.5% / 2.6%   Personal history of radiation therapy    Vitamin D  deficiency    Past Surgical History:  Procedure Laterality Date   BREAST BIOPSY Left 03/16/2022   MM LT BREAST BX W LOC DEV 1ST LESION IMAGE BX SPEC STEREO GUIDE 03/16/2022 [facility]   BREAST BIOPSY Left 03/16/2022   MM LT BREAST BX W LOC DEV EA AD LESION IMG BX SPEC STEREO GUIDE 03/16/2022 [facility]   BREAST BIOPSY  04/11/2022   MM LT RADIOACTIVE SEED LOC MAMMO GUIDE 04/11/2022 [facility]    BREAST BIOPSY  04/11/2022   MM LT RADIOACTIVE SEED EA ADD LESION LOC MAMMO GUIDE 04/11/2022 [facility]   BREAST LUMPECTOMY     BREAST LUMPECTOMY WITH RADIOACTIVE SEED LOCALIZATION Left 04/12/2022   Procedure: LEFT BREAST BRACKETED LUMPECTOMY WITH RADIOACTIVE SEED LOCALIZATION;  Surgeon: Curvin Deward MOULD, MD;  Location: [facility];  Service: General;  Laterality: Left;   cateract surgery     COLONOSCOPY  2011   dental implant     NOSE SURGERY     OOPHORECTOMY     VAGINAL HYSTERECTOMY  1976   FOR STERILIZATION    reports that she has been smoking cigarettes. She has never used smokeless tobacco. She reports current alcohol use of about 10.0 standard drinks of alcohol per week. She reports that she does not use drugs. family history includes Breast cancer in her mother and sister; Cancer in her sister and sister; Diabetes in her mother; Heart disease in her mother; Hypertension in her mother. Allergies  Allergen Reactions   Aspirin     REACTION: brain aneurysm   Black Cohosh     REACTION: hives   Promethazine Hcl     REACTION: rash   Current Outpatient Medications  on File Prior to Visit  Medication Sig Dispense Refill   amLODipine  (NORVASC ) 5 MG tablet Take 1 tablet by mouth once daily 90 tablet 3   amoxicillin -clavulanate (AUGMENTIN ) 875-125 MG tablet Take 1 tablet by mouth 2 (two) times daily. 20 tablet 0   celecoxib  (CELEBREX ) 100 MG capsule Take 1 capsule (100 mg total) by mouth 2 (two) times daily. (Patient taking differently: Take 100 mg by mouth as needed.) 30 capsule 11   cholecalciferol (VITAMIN D3) 25 MCG (1000 UNIT) tablet Take 1,000 Units by mouth daily.     lansoprazole  (PREVACID ) 15 MG capsule TAKE 1 CAPSULE EVERY DAY 90 capsule 3   Misc Natural Products (LUTEIN 20) CAPS Take 1 tablet by mouth daily.     Multiple Vitamin (MULTIVITAMIN) tablet Take 1 tablet by mouth daily.     psyllium (METAMUCIL) 58.6 % packet Take 1 packet by mouth daily.     No  current facility-administered medications on file prior to visit.        ROS:  All others reviewed and negative.  Objective        PE:  BP 126/78 (BP Location: Right Arm, Patient Position: Sitting, Cuff Size: Normal)   Pulse 80   Temp 98.9 F (37.2 C) (Oral)   Ht 5' 3 (1.6 m)   Wt 111 lb (50.3 kg)   SpO2 98%   BMI 19.66 kg/m                 Constitutional: Pt appears in NAD               HENT: Head: NCAT.                Right Ear: External ear normal.                 Left Ear: External ear normal.                Eyes: . Pupils are equal, round, and reactive to light. Conjunctivae and EOM are normal               Nose: without d/c or deformity, Bilat tm's with mild erythema.  Max sinus areas minimal tender.  Pharynx with mild erythema, no exudate               Neck: Neck supple. Gross normal ROM               Cardiovascular: Normal rate and regular rhythm.                 Pulmonary/Chest: Effort normal and breath sounds without rales or wheezing.                Abd:  Soft, NT, ND, + BS, no organomegaly               Neurological: Pt is alert. At baseline orientation, motor grossly intact               Skin: Skin is warm. No rashes, no other new lesions, LE edema - none               Psychiatric: Pt behavior is normal without agitation   Micro: none  Cardiac tracings I have personally interpreted today:  none  Pertinent Radiological findings (summarize): none   Lab Results  Component Value Date   WBC 7.8 07/18/2023   HGB 13.8 07/18/2023   HCT 39.9 07/18/2023   PLT 336.0 07/18/2023   GLUCOSE  79 07/18/2023   CHOL 238 (H) 07/18/2023   TRIG 125.0 07/18/2023   HDL 76.90 07/18/2023   LDLDIRECT 126.0 07/18/2019   LDLCALC 136 (H) 07/18/2023   ALT 10 07/18/2023   AST 17 07/18/2023   NA 132 (L) 07/18/2023   K 4.0 07/18/2023   CL 99 07/18/2023   CREATININE 0.60 07/18/2023   BUN 12 07/18/2023   CO2 26 07/18/2023   TSH 2.91 07/18/2023   HGBA1C 5.7 07/18/2023    Assessment/Plan:  Isabel Christensen is a 78 y.o. White or Caucasian [1] female with  has a past medical history of Brain aneurysm, DIVERTICULOSIS, COLON (06/05/2007), GERD (06/05/2007), GOITER (03/03/2008), Hematuria, HYPERLIPIDEMIA (06/05/2007), Hypertension, Osteopenia (04/2018), Personal history of radiation therapy, and Vitamin D  deficiency.  Vitamin D  deficiency Last vitamin D  Lab Results  Component Value Date   VD25OH 37.13 07/18/2023   Low, to start  oral replacement    Smoker Pt counsled to quit smoking, pt not reaady  Sinus pain Has allergies and afeb, but also with signfiicant pressure bilateral max sinus with at least mild obstruction to inhaling through nose - now for zyrtec 10 every day, flonase  asd, mucinex bid prn, CT sinus and refer ENT per pt reqeust  Hyperlipidemia Lab Results  Component Value Date   LDLCALC 136 (H) 07/18/2023   uncontrolled pt cont to decline statin or zetia repatha for now, for lower chol diet   HTN (hypertension) BP Readings from Last 3 Encounters:  01/04/24 126/78  12/11/23 138/76  07/25/23 124/72   Stable, pt to continue medical treatment norvasc  5 qd  Followup: Return in about 6 months (around 07/03/2024).  Lynwood Rush, MD 01/05/2024 12:32 PM [facility]

## 2024-01-05 ENCOUNTER — Encounter: Payer: Self-pay | Admitting: Internal Medicine

## 2024-01-05 NOTE — Assessment & Plan Note (Signed)
 BP Readings from Last 3 Encounters:  01/04/24 126/78  12/11/23 138/76  07/25/23 124/72   Stable, pt to continue medical treatment norvasc  5 qd

## 2024-01-05 NOTE — Assessment & Plan Note (Signed)
 Pt counsled to quit smoking, pt not reaady

## 2024-01-05 NOTE — Assessment & Plan Note (Signed)
 Lab Results  Component Value Date   LDLCALC 136 (H) 07/18/2023   uncontrolled pt cont to decline statin or zetia repatha for now, for lower chol diet

## 2024-01-05 NOTE — Assessment & Plan Note (Signed)
 Last vitamin D  Lab Results  Component Value Date   VD25OH 37.13 07/18/2023   Low, to start oral replacement

## 2024-01-05 NOTE — Assessment & Plan Note (Signed)
 Has allergies and afeb, but also with signfiicant pressure bilateral max sinus with at least mild obstruction to inhaling through nose - now for zyrtec 10 every day, flonase  asd, mucinex bid prn, CT sinus and refer ENT per pt reqeust

## 2024-01-08 ENCOUNTER — Ambulatory Visit
Admission: RE | Admit: 2024-01-08 | Discharge: 2024-01-08 | Disposition: A | Discharge status: Home / Self Care | Source: Ambulatory Visit | Attending: Internal Medicine | Admitting: Internal Medicine

## 2024-01-08 DIAGNOSIS — J3489 Other specified disorders of nose and nasal sinuses: Secondary | ICD-10-CM

## 2024-01-12 ENCOUNTER — Ambulatory Visit: Payer: Self-pay | Admitting: Internal Medicine

## 2024-02-18 ENCOUNTER — Other Ambulatory Visit: Payer: Self-pay | Admitting: Internal Medicine

## 2024-02-18 DIAGNOSIS — N6489 Other specified disorders of breast: Secondary | ICD-10-CM

## 2024-02-18 DIAGNOSIS — Z853 Personal history of malignant neoplasm of breast: Secondary | ICD-10-CM

## 2024-02-25 ENCOUNTER — Inpatient Hospital Stay: Payer: Medicare Other | Attending: Hematology and Oncology | Admitting: Adult Health

## 2024-02-25 ENCOUNTER — Encounter: Payer: Self-pay | Admitting: Adult Health

## 2024-02-25 DIAGNOSIS — F17210 Nicotine dependence, cigarettes, uncomplicated: Secondary | ICD-10-CM | POA: Insufficient documentation

## 2024-02-25 DIAGNOSIS — Z170 Estrogen receptor positive status [ER+]: Secondary | ICD-10-CM | POA: Insufficient documentation

## 2024-02-25 DIAGNOSIS — Z923 Personal history of irradiation: Secondary | ICD-10-CM | POA: Diagnosis not present

## 2024-02-25 DIAGNOSIS — B9689 Other specified bacterial agents as the cause of diseases classified elsewhere: Secondary | ICD-10-CM | POA: Diagnosis not present

## 2024-02-25 DIAGNOSIS — J019 Acute sinusitis, unspecified: Secondary | ICD-10-CM

## 2024-02-25 DIAGNOSIS — Z1721 Progesterone receptor positive status: Secondary | ICD-10-CM | POA: Insufficient documentation

## 2024-02-25 DIAGNOSIS — Z808 Family history of malignant neoplasm of other organs or systems: Secondary | ICD-10-CM | POA: Insufficient documentation

## 2024-02-25 DIAGNOSIS — D0512 Intraductal carcinoma in situ of left breast: Secondary | ICD-10-CM | POA: Insufficient documentation

## 2024-02-25 DIAGNOSIS — J341 Cyst and mucocele of nose and nasal sinus: Secondary | ICD-10-CM | POA: Diagnosis not present

## 2024-02-25 DIAGNOSIS — N951 Menopausal and female climacteric states: Secondary | ICD-10-CM | POA: Diagnosis not present

## 2024-02-25 DIAGNOSIS — Z807 Family history of other malignant neoplasms of lymphoid, hematopoietic and related tissues: Secondary | ICD-10-CM | POA: Insufficient documentation

## 2024-02-25 DIAGNOSIS — Z803 Family history of malignant neoplasm of breast: Secondary | ICD-10-CM | POA: Diagnosis not present

## 2024-02-25 NOTE — Progress Notes (Signed)
 [facility] Cancer Follow up:    Isabel Christensen ORN, MD [address] [place] [address]   DIAGNOSIS:  Cancer Staging  Ductal carcinoma in situ (DCIS) of left breast Staging form: Breast, AJCC 8th Edition - Clinical: Stage 0 (cTis (DCIS), cN0, cM0, G3, ER+, PR+, HER2: Not Assessed) - Signed by Odean Potts, MD on 04/03/2022 Stage prefix: Initial diagnosis Histologic grading system: 3 grade system    SUMMARY OF ONCOLOGIC HISTORY: Oncology History  Ductal carcinoma in situ (DCIS) of left breast  03/16/2022 Initial Diagnosis   Screening mammogram detected pleomorphic microcalcifications in the upper inner left breast 6 to 8 cm from the nipple in a loosely grouped/somewhat segmental distribution spanning 2 cm. Left Breast Biopsy UIQ anterior and Posterior: IG-HG DCIS Er 100%, PR 100%   04/03/2022 Cancer Staging   Staging form: Breast, AJCC 8th Edition - Clinical: Stage 0 (cTis (DCIS), cN0, cM0, G3, ER+, PR+, HER2: Not Assessed) - Signed by Odean Potts, MD on 04/03/2022 Stage prefix: Initial diagnosis Histologic grading system: 3 grade system   04/12/2022 Surgery   Left lumpectomy: High-grade DCIS cribriform type with necrosis, 1.3 cm, negative for invasive cancer, margins negative ER 100%, PR 100%   05/25/2022 - 06/13/2022 Radiation Therapy   Plan Name: Breast_L_BH Site: Breast, Left Technique: 3D Mode: Photon Dose Per Fraction: 2.67 Gy Prescribed Dose (Delivered / Prescribed): 40.05 Gy / 40.05 Gy Prescribed Fxs (Delivered / Prescribed): 15 / 15   06/16/2022 - 11/2022 Anti-estrogen oral therapy   Tamoxifen ; discontinued due to hot flashes, declined trying alternate antiestrogen therapy.     CURRENT THERAPY: observation  INTERVAL HISTORY:  Discussed the use of AI scribe software for clinical note transcription with the patient, who gave verbal consent to proceed.  History of Present Illness Isabel Christensen is a 78 year old female with left breast ductal carcinoma  status post lumpectomy and adjuvant radiation who presents for evaluation of a new palpable breast lump.  She was diagnosed with left breast ductal carcinoma in January 2024, treated with lumpectomy and adjuvant radiation. Tamoxifen  was started in April 2024 and stopped in October 2024, and she has since been managed with observation. Her January 2025 mammogram showed no malignancy with breast density category B.  She reports a palpable lump in the left breast since surgery that she feels has increased in size. The area is uncomfortable, especially when lying prone. She has no other new breast changes.  She stays active with exercise and gardening but notes hot flushes and fatigue with activity. She is interested in estrogen therapy for symptom control.  She was recently started on melatonin for sleep, which first helped but then caused insomnia and altered mental status, so it was stopped. She currently feels mentally well without acute psychiatric concerns relevant to her cancer care.  A mucus retention cyst in the posterior medial left maxillary sinus was found on recent CT, and she is awaiting ENT follow-up.   Patient Active Problem List   Diagnosis Date Noted   Sinus pain 01/04/2024   Other insomnia 12/14/2023   Bacterial sinusitis 12/11/2023   Productive cough 09/19/2023   Elevated coronary artery calcium  score 07/25/2023   Palpitations 01/26/2023   Hot flashes 01/26/2023   Rash 07/26/2022   Ductal carcinoma in situ (DCIS) of left breast 03/22/2022   PHN (postherpetic neuralgia) 11/17/2021   Vitamin D  deficiency 07/19/2020   Arthritis 07/19/2020   Vertigo 07/19/2020   Allergic rhinitis 12/20/2019   DDD (degenerative disc disease), lumbar  07/18/2019   Polyarthralgia 07/18/2019   HTN (hypertension) 07/18/2019   Thrombocytosis 03/12/2017   Acute non-recurrent pansinusitis 05/15/2016   Imbalance 02/11/2016   Acute upper respiratory infection 01/03/2016   Osteopenia 01/20/2015    Vaginal atrophy 01/15/2014   Smoker 04/23/2012   Encounter for well adult exam with abnormal findings 03/07/2012   Brain aneurysm 03/07/2012   Hypertrophy of salivary gland 01/26/2010   Backache 11/17/2008   Goiter 03/03/2008   Hyperlipidemia 06/05/2007   GERD 06/05/2007   Diverticulosis of colon 06/05/2007    is allergic to aspirin, black cohosh, and promethazine hcl.  MEDICAL HISTORY: Past Medical History:  Diagnosis Date   Brain aneurysm    DIVERTICULOSIS, COLON 06/05/2007   Qualifier: Diagnosis of  By: Norleen MD, Christensen ORN    GERD 06/05/2007   Qualifier: Diagnosis of  By: Helga Almarie Caldron    GOITER 03/03/2008   Qualifier: Diagnosis of  By: Norleen MD, Christensen ORN    Hematuria    FOLLOWED BY UROLOGIST FOR THIS   HYPERLIPIDEMIA 06/05/2007   Qualifier: Diagnosis of  By: Norleen MD, Christensen ORN    Hypertension    Osteopenia 04/2018   T score -1.4 FRAX 9.5% / 2.6%   Personal history of radiation therapy    Vitamin D  deficiency     SURGICAL HISTORY: Past Surgical History:  Procedure Laterality Date   BREAST BIOPSY Left 03/16/2022   MM LT BREAST BX W LOC DEV 1ST LESION IMAGE BX SPEC STEREO GUIDE 03/16/2022 [facility]   BREAST BIOPSY Left 03/16/2022   MM LT BREAST BX W LOC DEV EA AD LESION IMG BX SPEC STEREO GUIDE 03/16/2022 [facility]   BREAST BIOPSY  04/11/2022   MM LT RADIOACTIVE SEED LOC MAMMO GUIDE 04/11/2022 [facility]   BREAST BIOPSY  04/11/2022   MM LT RADIOACTIVE SEED EA ADD LESION LOC MAMMO GUIDE 04/11/2022 [facility]   BREAST LUMPECTOMY     BREAST LUMPECTOMY WITH RADIOACTIVE SEED LOCALIZATION Left 04/12/2022   Procedure: LEFT BREAST BRACKETED LUMPECTOMY WITH RADIOACTIVE SEED LOCALIZATION;  Surgeon: Curvin Deward MOULD, MD;  Location: [facility];  Service: General;  Laterality: Left;   cateract surgery     COLONOSCOPY  2011   dental implant     NOSE SURGERY     OOPHORECTOMY     VAGINAL HYSTERECTOMY  1976   FOR STERILIZATION     SOCIAL HISTORY: Social History   Socioeconomic History   Marital status: Married    Spouse name: John   Number of children: Not on file   Years of education: 12   Highest education level: High school graduate  Occupational History   Occupation: RETIRED  Tobacco Use   Smoking status: Every Day    Current packs/day: 0.50    Types: Cigarettes   Smokeless tobacco: Never   Tobacco comments:    05/23/2023 Patient smokes 1/2 pack cigarettes daily  Vaping Use   Vaping status: Never Used  Substance and Sexual Activity   Alcohol use: Yes    Alcohol/week: 10.0 standard drinks of alcohol    Types: 10 Standard drinks or equivalent per week   Drug use: No   Sexual activity: Yes    Birth control/protection: Surgical    Comment: Hyst, First IC >54 y/o, <5 Partners hx breast cancer 2024  Other Topics Concern   Not on file  Social History Narrative   Lives with husband/2025   Social Drivers of Health   Tobacco Use: High Risk (02/25/2024)  Patient History    Smoking Tobacco Use: Every Day    Smokeless Tobacco Use: Never    Passive Exposure: Not on file  Financial Resource Strain: Low Risk (07/24/2023)   Overall Financial Resource Strain (CARDIA)    Difficulty of Paying Living Expenses: Not hard at all  Food Insecurity: No Food Insecurity (02/25/2024)   Epic    Worried About Programme Researcher, Broadcasting/film/video in the Last Year: Never true    Ran Out of Food in the Last Year: Never true  Transportation Needs: No Transportation Needs (02/25/2024)   Epic    Lack of Transportation (Medical): No    Lack of Transportation (Non-Medical): No  Physical Activity: Inactive (07/24/2023)   Exercise Vital Sign    Days of Exercise per Week: 0 days    Minutes of Exercise per Session: 0 min  Stress: No Stress Concern Present (07/24/2023)   [employer] of Occupational Health - Occupational Stress Questionnaire    Feeling of Stress : Not at all  Social Connections: Moderately Integrated (02/25/2024)    Social Connection and Isolation Panel    Frequency of Communication with Friends and Family: Three times a week    Frequency of Social Gatherings with Friends and Family: Three times a week    Attends Religious Services: More than 4 times per year    Active Member of Clubs or Organizations: No    Attends Banker Meetings: Never    Marital Status: Married  Catering Manager Violence: Not At Risk (02/25/2024)   Epic    Fear of Current or Ex-Partner: No    Emotionally Abused: No    Physically Abused: No    Sexually Abused: No  Depression (PHQ2-9): Low Risk (02/25/2024)   Depression (PHQ2-9)    PHQ-2 Score: 0  Alcohol Screen: Low Risk (02/25/2024)   Alcohol Screen    Last Alcohol Screening Score (AUDIT): 0  Housing: Unknown (02/25/2024)   Epic    Unable to Pay for Housing in the Last Year: No    Number of Times Moved in the Last Year: Not on file    Homeless in the Last Year: No  Utilities: Not At Risk (02/25/2024)   Epic    Threatened with loss of utilities: No  Health Literacy: Adequate Health Literacy (02/25/2024)   B1300 Health Literacy    Frequency of need for help with medical instructions: Never    FAMILY HISTORY: Family History  Problem Relation Age of Onset   Diabetes Mother    Hypertension Mother    Heart disease Mother    Breast cancer Mother        Age 51   Cancer Sister        MELANOMA   Breast cancer Sister        Age 35   Cancer Sister        Melanoma,Lymphoma   Colon cancer Neg Hx    Colon polyps Neg Hx    Esophageal cancer Neg Hx    Rectal cancer Neg Hx    Stomach cancer Neg Hx     Review of Systems  Constitutional:  Negative for appetite change, chills, fatigue, fever and unexpected weight change.  HENT:   Negative for hearing loss, lump/mass and trouble swallowing.   Eyes:  Negative for eye problems and icterus.  Respiratory:  Negative for chest tightness, cough and shortness of breath.   Cardiovascular:  Negative for chest pain,  leg swelling and palpitations.  Gastrointestinal:  Negative for abdominal  distention, abdominal pain, constipation, diarrhea, nausea and vomiting.  Endocrine: Negative for hot flashes.  Genitourinary:  Negative for difficulty urinating.   Musculoskeletal:  Negative for arthralgias.  Skin:  Negative for itching and rash.  Neurological:  Negative for dizziness, extremity weakness, headaches and numbness.  Hematological:  Negative for adenopathy. Does not bruise/bleed easily.  Psychiatric/Behavioral:  Negative for depression. The patient is not nervous/anxious.       PHYSICAL EXAMINATION   Onc Performance Status - 02/25/24 1044       ECOG Perf Status   ECOG Perf Status Restricted in physically strenuous activity but ambulatory and able to carry out work of a light or sedentary nature, e.g., light house work, office work      KPS SCALE   KPS % SCORE Able to carry on normal activity, minor s/s of disease          Vitals:   02/25/24 1031  BP: (!) 141/69  Pulse: 75  Resp: 18  Temp: 97.8 F (36.6 C)  SpO2: 99%    Physical Exam Constitutional:      General: She is not in acute distress.    Appearance: Normal appearance. She is not toxic-appearing.  HENT:     Head: Normocephalic and atraumatic.     Mouth/Throat:     Mouth: Mucous membranes are moist.     Pharynx: Oropharynx is clear. No oropharyngeal exudate or posterior oropharyngeal erythema.  Eyes:     General: No scleral icterus. Cardiovascular:     Rate and Rhythm: Normal rate and regular rhythm.     Pulses: Normal pulses.     Heart sounds: Normal heart sounds.  Pulmonary:     Effort: Pulmonary effort is normal.     Breath sounds: Normal breath sounds.  Chest:     Comments: Left breast s/p lumpectomy and radiation, 1cm nodule noted at lumpectomy site, right breast benign Abdominal:     General: Abdomen is flat. Bowel sounds are normal. There is no distension.     Palpations: Abdomen is soft.     Tenderness:  There is no abdominal tenderness.  Musculoskeletal:        General: No swelling.     Cervical back: Neck supple.  Lymphadenopathy:     Cervical: No cervical adenopathy.     Upper Body:     Right upper body: No supraclavicular or axillary adenopathy.     Left upper body: No supraclavicular or axillary adenopathy.  Skin:    General: Skin is warm and dry.     Findings: No rash.  Neurological:     General: No focal deficit present.     Mental Status: She is alert.  Psychiatric:        Mood and Affect: Mood normal.        Behavior: Behavior normal.       ASSESSMENT and THERAPY PLAN:    Assessment and Plan Assessment & Plan Left breast DCIS, post-treatment, under observation Status post lumpectomy and adjuvant radiation. Tamoxifen  discontinued October 2024. Palpable lump at surgical site, no malignancy on January 2025 mammogram. Estrogen therapy contraindicated due to recurrence risk. - Performed breast examination to assess palpable lump. - Scheduled diagnostic bilateral mammogram for January 2026. - Advised to report abnormal imaging results or new breast changes. - Reinforced estrogen replacement therapy contraindication due to recurrence risk.  RTC in 03/2025 (after 02/2025 mammogram).    All questions were answered. The patient knows to call the clinic with any problems, questions or  concerns. We can certainly see the patient much sooner if necessary.  Total encounter time: 20 minutes*in face-to-face visit time, chart review, lab review, care coordination, order entry, and documentation of the encounter time.    Morna Kendall, NP 02/25/2024 2:17 PM Medical Oncology and Hematology [facility] [address] [place], [place] [address] Tel. [phone]    Fax. [phone]  *Total Encounter Time as defined by the Centers for Medicare and Medicaid Services includes, in addition to the face-to-face time of a patient visit (documented in the note above)  non-face-to-face time: obtaining and reviewing outside history, ordering and reviewing medications, tests or procedures, care coordination (communications with other health care professionals or caregivers) and documentation in the medical record.

## 2024-02-26 ENCOUNTER — Ambulatory Visit (INDEPENDENT_AMBULATORY_CARE_PROVIDER_SITE_OTHER): Admitting: Physician Assistant

## 2024-02-26 ENCOUNTER — Encounter (INDEPENDENT_AMBULATORY_CARE_PROVIDER_SITE_OTHER): Payer: Self-pay | Admitting: Physician Assistant

## 2024-02-26 VITALS — BP 160/81 | HR 72 | Temp 98.3°F | Ht 63.25 in | Wt 109.0 lb

## 2024-02-26 DIAGNOSIS — J341 Cyst and mucocele of nose and nasal sinus: Secondary | ICD-10-CM | POA: Diagnosis not present

## 2024-02-26 DIAGNOSIS — J310 Chronic rhinitis: Secondary | ICD-10-CM | POA: Diagnosis not present

## 2024-02-26 NOTE — Progress Notes (Signed)
 Dear Dr. Norleen, Here is my assessment for our mutual patient, Isabel Christensen. Thank you for allowing me the opportunity to care for your patient. Please do not hesitate to contact me should you have any other questions. Sincerely, Isabel Cohen PA-C  Otolaryngology Clinic Note Referring provider: Dr. Norleen HPI:  Isabel Christensen is a 78 y.o. female kindly referred by Dr. Norleen   Discussed the use of AI scribe software for clinical note transcription with the patient, who gave verbal consent to proceed.  History of Present Illness   Isabel Christensen is a 78 year old female who presents with persistent left-sided sinus pressure and pain.  In the summer of 2025, she developed an upper respiratory infection with nasal congestion, cough, and malaise, which resolved after antibiotic therapy. Several months later, she experienced disequilibrium, head pressure, and a change in voice quality on the right, described as sounding as if the bass was turned down and the treble turned up. She also noted a whooshing sound in her ear and intermittent nausea without emesis. During this period, she did not have nasal drainage or obstructive symptoms.  She was unable to tolerate antihistamines due to dry eye syndrome and subsequently initiated NeilMed nasal saline irrigation and Flonase  nasal spray nightly, which improved her symptoms, particularly in the mornings. Despite this, she continues to experience pressure and pain in the evenings, most pronounced above the left eyebrow and cheek, with peak severity during her initial illness. She denies fever, current nasal drainage, or hearing loss. Her equilibrium and hearing have returned to baseline.  She has not had prior CT imaging of her head but did undergo angiogram and MRI 33 years ago for a brain aneurysm. She asked whether the cyst could be related to her symptoms. She denies a history of chronic sinus disease or persistent nasal obstruction outside of the recent illness.            Independent Review of Additional Tests or Records:  CT sinus without contrast 01/08/2024- IMPRESSION: 1. Mucous retention cyst in the posteromedial left maxillary sinus.    PMH/Meds/All/SocHx/FamHx/ROS:   Past Medical History:  Diagnosis Date   Brain aneurysm    DIVERTICULOSIS, COLON 06/05/2007   Qualifier: Diagnosis of  By: Norleen MD, Lynwood ORN    GERD 06/05/2007   Qualifier: Diagnosis of  By: Helga Almarie Caldron    GOITER 03/03/2008   Qualifier: Diagnosis of  By: Norleen MD, Lynwood ORN    Hematuria    FOLLOWED BY UROLOGIST FOR THIS   HYPERLIPIDEMIA 06/05/2007   Qualifier: Diagnosis of  By: Norleen MD, Lynwood ORN    Hypertension    Osteopenia 04/2018   T score -1.4 FRAX 9.5% / 2.6%   Personal history of radiation therapy    Vitamin D  deficiency      Past Surgical History:  Procedure Laterality Date   BREAST BIOPSY Left 03/16/2022   MM LT BREAST BX W LOC DEV 1ST LESION IMAGE BX SPEC STEREO GUIDE 03/16/2022 [facility]   BREAST BIOPSY Left 03/16/2022   MM LT BREAST BX W LOC DEV EA AD LESION IMG BX SPEC STEREO GUIDE 03/16/2022 [facility]   BREAST BIOPSY  04/11/2022   MM LT RADIOACTIVE SEED LOC MAMMO GUIDE 04/11/2022 [facility]   BREAST BIOPSY  04/11/2022   MM LT RADIOACTIVE SEED EA ADD LESION LOC MAMMO GUIDE 04/11/2022 [facility]   BREAST LUMPECTOMY     BREAST LUMPECTOMY WITH RADIOACTIVE SEED LOCALIZATION Left 04/12/2022   Procedure: LEFT BREAST BRACKETED  LUMPECTOMY WITH RADIOACTIVE SEED LOCALIZATION;  Surgeon: Curvin Deward MOULD, MD;  Location:  [facility];  Service: General;  Laterality: Left;   cateract surgery     COLONOSCOPY  2011   dental implant     NOSE SURGERY     OOPHORECTOMY     VAGINAL HYSTERECTOMY  1976   FOR STERILIZATION    Family History  Problem Relation Age of Onset   Diabetes Mother    Hypertension Mother    Heart disease Mother    Breast cancer Mother        Age 23   Cancer Sister        MELANOMA    Breast cancer Sister        Age 8   Cancer Sister        Melanoma,Lymphoma   Colon cancer Neg Hx    Colon polyps Neg Hx    Esophageal cancer Neg Hx    Rectal cancer Neg Hx    Stomach cancer Neg Hx      Social Connections: Moderately Integrated (02/25/2024)   Social Connection and Isolation Panel    Frequency of Communication with Friends and Family: Three times a week    Frequency of Social Gatherings with Friends and Family: Three times a week    Attends Religious Services: More than 4 times per year    Active Member of Clubs or Organizations: No    Attends Banker Meetings: Never    Marital Status: Married     Current Medications[1]   Physical Exam:   BP (!) 160/81   Pulse 72   Temp 98.3 F (36.8 C)   Ht 5' 3.25 (1.607 m)   Wt 109 lb (49.4 kg)   SpO2 97%   BMI 19.16 kg/m   Pertinent Findings  CN II-XII grossly intact Bilateral EAC clear and TM intact with well pneumatized middle ear spaces Anterior rhinoscopy: Septum left deviation; bilateral inferior turbinates with no hypertrophy No lesions of oral cavity/oropharynx; dentition within normal limits No obviously palpable neck masses/lymphadenopathy/thyromegaly No respiratory distress or stridor      Seprately Identifiable Procedures:  None  Impression & Plans:  Isabel Christensen is a 78 y.o. female with the following   Assessment and Plan    Code retention cyst 1 cm posterior sinus retention cyst, likely incidental and benign. No obstruction, infection, or chronic sinus disease. Unlikely cause of symptoms. Surgical intervention risk outweighs benefit. - Reviewed CT imaging and explained cyst's location and benign nature. - Provided reassurance and advised against surgical or invasive intervention. - Instructed her to report new or worsening symptoms.  Chronic rhinitis Chronic rhinitis with intermittent nasal congestion, pressure, and pain, likely due to environmental factors. No acute infection,  significant allergy, or chronic sinus disease. Symptoms improved with saline irrigation and intranasal corticosteroid. - Recommended continued saline nasal irrigation (NeilMed) twice daily. - Advised continued use of Flonase  nasal steroid spray as previously prescribed. - Suggested saline mister spray throughout the day for additional moisture. - Recommended application of Vaseline intranasally at night and use of a humidifier for dryness. - Instructed her to return to clinic if symptoms worsen or fail to improve with current regimen.           - f/u PRN   Thank you for allowing me the opportunity to care for your patient. Please do not hesitate to contact me should you have any other questions.  Sincerely, Isabel Cohen PA-C [facility] Phone: [phone]  Fax: [phone]  02/26/2024, 3:12 PM        [1]  Current Outpatient Medications:    amLODipine  (NORVASC ) 5 MG tablet, Take 1 tablet by mouth once daily, Disp: 90 tablet, Rfl: 3   celecoxib  (CELEBREX ) 100 MG capsule, Take 1 capsule (100 mg total) by mouth 2 (two) times daily. (Patient taking differently: Take 100 mg by mouth as needed.), Disp: 30 capsule, Rfl: 11   cholecalciferol (VITAMIN D3) 25 MCG (1000 UNIT) tablet, Take 1,000 Units by mouth daily., Disp: , Rfl:    lansoprazole  (PREVACID ) 15 MG capsule, TAKE 1 CAPSULE EVERY DAY, Disp: 90 capsule, Rfl: 3   Misc Natural Products (LUTEIN 20) CAPS, Take 1 tablet by mouth daily., Disp: , Rfl:    Multiple Vitamin (MULTIVITAMIN) tablet, Take 1 tablet by mouth daily., Disp: , Rfl:    psyllium (METAMUCIL) 58.6 % packet, Take 1 packet by mouth daily., Disp: , Rfl:

## 2024-02-26 NOTE — Progress Notes (Signed)
 Patient having BP managed. Patient takes BP medication at night. She did take it last night.

## 2024-03-13 ENCOUNTER — Encounter: Payer: Self-pay | Admitting: Nurse Practitioner

## 2024-03-13 ENCOUNTER — Ambulatory Visit: Payer: Medicare Other | Admitting: Nurse Practitioner

## 2024-03-13 VITALS — BP 114/70 | HR 73 | Ht 62.75 in | Wt 111.0 lb

## 2024-03-13 DIAGNOSIS — Z9189 Other specified personal risk factors, not elsewhere classified: Secondary | ICD-10-CM | POA: Diagnosis not present

## 2024-03-13 DIAGNOSIS — N760 Acute vaginitis: Secondary | ICD-10-CM | POA: Diagnosis not present

## 2024-03-13 DIAGNOSIS — M85852 Other specified disorders of bone density and structure, left thigh: Secondary | ICD-10-CM

## 2024-03-13 DIAGNOSIS — B9689 Other specified bacterial agents as the cause of diseases classified elsewhere: Secondary | ICD-10-CM | POA: Diagnosis not present

## 2024-03-13 DIAGNOSIS — N9489 Other specified conditions associated with female genital organs and menstrual cycle: Secondary | ICD-10-CM

## 2024-03-13 DIAGNOSIS — D0512 Intraductal carcinoma in situ of left breast: Secondary | ICD-10-CM | POA: Diagnosis not present

## 2024-03-13 DIAGNOSIS — Z780 Asymptomatic menopausal state: Secondary | ICD-10-CM

## 2024-03-13 DIAGNOSIS — N952 Postmenopausal atrophic vaginitis: Secondary | ICD-10-CM | POA: Diagnosis not present

## 2024-03-13 DIAGNOSIS — Z01419 Encounter for gynecological examination (general) (routine) without abnormal findings: Secondary | ICD-10-CM

## 2024-03-13 DIAGNOSIS — M85851 Other specified disorders of bone density and structure, right thigh: Secondary | ICD-10-CM

## 2024-03-13 LAB — WET PREP FOR TRICH, YEAST, CLUE

## 2024-03-13 MED ORDER — ESTRADIOL 0.01 % VA CREA: 1.0000 g | TOPICAL_CREAM | Freq: Every day | VAGINAL | 0 refills | Status: AC

## 2024-03-13 MED ORDER — METRONIDAZOLE 0.75 % VA GEL: 1.0000 | Freq: Every day | VAGINAL | 0 refills | Status: AC

## 2024-03-13 NOTE — Progress Notes (Signed)
 "  Isabel Christensen 1945/04/12 [id number]   History:  79 y.o. G1P0010 presents for breast and pelvic exam. Postmenopausal - stopped ERT when diagnosed with breast cancer in Jan 2024.  January 2024 left DCIS ER+/PR+ managed with lumpectomy and radiation. Stopped Tamoxifen  due to side effects. Has constant vulvar burning and intercourse is painful since stopping estrogen. Using Vit E suppositories and Astroglide with little improvement.  S/P 1976 [facility] BSO for sterilization. Normal pap history. HTN, HLD managed by PCP. Smokes 1/2 ppd.  Gynecologic History No LMP recorded. Patient has had a hysterectomy.   Contraception: status post hysterectomy Sexually active: Yes  Health Maintenance Last Pap: 2012. Results were: Normal Last mammogram: 03/14/2023. Results were: Normal Last colonoscopy: 08/27/2019. Results were: Tubular adenomas, 3-5 year recall Last Dexa: 04/26/2021. Results were: T-score -1.2, FRAX 9.3% / 2.8%  Past medical history, past surgical history, family history and social history were all reviewed and documented in the EPIC chart. Married. Has 2 stepchildren. Mother and sister diagnosed with breast cancer at age 33.   ROS:  A ROS was performed and pertinent positives and negatives are included.  Exam:  Vitals:   03/13/24 1131  BP: 114/70  Pulse: 73  SpO2: 99%  Weight: 111 lb (50.3 kg)  Height: 5' 2.75 (1.594 m)     Body mass index is 19.82 kg/m.  General appearance:  Normal Thyroid :  Symmetrical, normal in size, without palpable masses or nodularity. Respiratory  Auscultation:  Clear without wheezing or rhonchi Cardiovascular  Auscultation:  Regular rate, without rubs, murmurs or gallops  Edema/varicosities:  Not grossly evident Abdominal  Soft,nontender, without masses, guarding or rebound.  Liver/spleen:  No organomegaly noted  Hernia:  None appreciated  Skin  Inspection:  Grossly normal Breasts: Examined lying and sitting.   Right: Without masses,  retractions, nipple discharge or axillary adenopathy.  Left: ~1 inch mass @ 10 o'clock 4 cm from nipple (fat necrosis on imaging), no retractions, nipple discharge or axillary adenopathy. Pelvic: External genitalia:  no lesions. Generalized redness              Urethra:  normal appearing urethra with no masses, tenderness or lesions              Bartholins and Skenes: normal                 Vagina: normal appearing vagina with normal color and discharge, no lesions. Atrophic changes. Discharge present              Cervix: absent Bimanual Exam:  Uterus:  absent              Adnexa: no mass, fullness, tenderness              Rectovaginal: Deferred              Anus:  normal, no lesions  Isabel Christensen, CMA present as chaperone.   Wet prep + clue cells (+ odor)  Assessment/Plan:  79 y.o. G1P0010 for breast and pelvic exam.   Encounter for breast and pelvic examination - Education provided on SBEs, importance of preventative screenings, current guidelines, high calcium  diet, regular exercise, and multivitamin daily. Labs with PCP.   Postmenopausal - S/P [facility] BSO, stopped ERT in 2024 when diagnosed with breast cancer.   Ductal carcinoma in situ (DCIS) of left breast - January 2024 left DCIS ER+/PR+ managed with lumpectomy. Stopped Tamoxifen  due to side effects. Mammogram scheduled tomorrow.   Osteopenia of necks of both femurs -  Plan: DG Bone Density. T-score -1.2 without elevated FRAX in February 2023. Continue Vitamin D  + Calcium  and regular exercise.   Vaginal atrophy - Plan: estradiol  (ESTRACE ) 0.01 % CREA vaginal cream twice weekly. Use nightly first 2 weeks. She will reach out to oncologist to make sure she is OK with her starting this. Reassured safe to use. Constant burning and painful intercourse. Little improvement with Vit E suppositories and astroglide.   Vulvar burning - Plan: WET PREP FOR TRICH, YEAST, CLUE. + BV  Bacterial vaginosis - Plan: metroNIDAZOLE  (METROGEL ) 0.75 % vaginal  gel nightly x 5 nights.   Screening for cervical cancer - Normal Pap history. No longer screening per guidelines.   Screening for colon cancer - 2021 colonoscopy - tubular adenomas. Will repeat at GI's recommended interval.   Return in about 1 year (around 03/13/2025) for B&P (high risk).     Isabel DELENA Shutter DNP, 12:10 PM 03/13/2024  "

## 2024-03-14 ENCOUNTER — Ambulatory Visit

## 2024-03-14 ENCOUNTER — Ambulatory Visit
Admission: RE | Admit: 2024-03-14 | Discharge: 2024-03-14 | Disposition: A | Discharge status: Home / Self Care | Source: Ambulatory Visit | Attending: Internal Medicine | Admitting: Internal Medicine

## 2024-03-14 ENCOUNTER — Ambulatory Visit: Admission: RE | Admit: 2024-03-14 | Source: Ambulatory Visit

## 2024-03-14 DIAGNOSIS — Z853 Personal history of malignant neoplasm of breast: Secondary | ICD-10-CM

## 2024-03-14 DIAGNOSIS — N6489 Other specified disorders of breast: Secondary | ICD-10-CM

## 2024-07-24 ENCOUNTER — Ambulatory Visit

## 2024-07-28 ENCOUNTER — Encounter: Admitting: Internal Medicine

## 2025-03-18 ENCOUNTER — Encounter: Admitting: Nurse Practitioner
